# Patient Record
Sex: Female | Born: 1965 | Race: Black or African American | Hispanic: No | Smoking: Never smoker
Health system: Southern US, Community
[De-identification: ages and names within clinical notes are randomized; demographics above are authoritative.]

## PROBLEM LIST (undated history)

## (undated) DIAGNOSIS — R519 Headache, unspecified: Secondary | ICD-10-CM

## (undated) DIAGNOSIS — D649 Anemia, unspecified: Secondary | ICD-10-CM

## (undated) DIAGNOSIS — M1990 Unspecified osteoarthritis, unspecified site: Secondary | ICD-10-CM

## (undated) DIAGNOSIS — Z21 Asymptomatic human immunodeficiency virus [HIV] infection status: Secondary | ICD-10-CM

## (undated) DIAGNOSIS — R51 Headache: Secondary | ICD-10-CM

## (undated) DIAGNOSIS — K219 Gastro-esophageal reflux disease without esophagitis: Secondary | ICD-10-CM

## (undated) DIAGNOSIS — E119 Type 2 diabetes mellitus without complications: Secondary | ICD-10-CM

## (undated) DIAGNOSIS — J449 Chronic obstructive pulmonary disease, unspecified: Secondary | ICD-10-CM

## (undated) DIAGNOSIS — B20 Human immunodeficiency virus [HIV] disease: Secondary | ICD-10-CM

## (undated) DIAGNOSIS — I10 Essential (primary) hypertension: Secondary | ICD-10-CM

## (undated) DIAGNOSIS — R011 Cardiac murmur, unspecified: Secondary | ICD-10-CM

## (undated) HISTORY — PX: KNEE ARTHROSCOPY: SUR90

## (undated) HISTORY — DX: Anemia, unspecified: D64.9

---

## 2014-10-04 HISTORY — PX: CHOLECYSTECTOMY: SHX55

## 2016-09-18 ENCOUNTER — Ambulatory Visit
Admission: EM | Admit: 2016-09-18 | Discharge: 2016-09-18 | Disposition: A | Discharge status: Home / Self Care | Payer: Medicaid Other

## 2016-09-18 DIAGNOSIS — M25562 Pain in left knee: Secondary | ICD-10-CM | POA: Diagnosis not present

## 2016-09-18 DIAGNOSIS — M545 Low back pain, unspecified: Secondary | ICD-10-CM

## 2016-09-18 DIAGNOSIS — G8929 Other chronic pain: Secondary | ICD-10-CM

## 2016-09-18 HISTORY — DX: Gastro-esophageal reflux disease without esophagitis: K21.9

## 2016-09-18 HISTORY — DX: Essential (primary) hypertension: I10

## 2016-09-18 HISTORY — DX: Type 2 diabetes mellitus without complications: E11.9

## 2016-09-18 NOTE — ED Triage Notes (Signed)
Pt c/o right knee pain, she has a history of a break in that knee about a year ago. She also c/o back pain, she mentions she pulled it when she was reaching for something and its been hurting for about 1 week.

## 2016-09-18 NOTE — ED Provider Notes (Signed)
CSN: [id number]     Arrival date & time 09/18/16  1332 History   None   Chief Complaint  Patient presents with  . Knee Pain    right  . Back Pain    lower   (Consider location/radiation/quality/duration/timing/severity/associated sxs/prior Treatment) 50 yo F reporting low back muscle discomfort and return of some aching in her right knee that was hurt about a year ago. Initially denies any trauma but further review reveals she has had full care of her 61 month old baby Granddaughter for the past week. Baby has gotten quite heavy for Ms Tincher and with the addition of the carseat carrier and transitioning in and out of the car she has had quite a work out. She feels well and would not give up baby care "for anything"  But had not thought of it as related to her muscle aches. She is left handed, maneuvers the baby and carrier with increased assistance from the right leg-which now is also mildly uncomfortable. No numbness or tingling. No dropping things.. Right knee had trauma about a year ago but generally does well. No foot drop, no stumbling. Recreation of the different positions exactly re-creates the muscle discomfort she has expereinced        Past Medical History:  Diagnosis Date  . Diabetes mellitus without complication ([place])   . GERD (gastroesophageal reflux disease)   . Hypertension    History reviewed. No pertinent surgical history. History reviewed. No pertinent family history. Social History  Substance Use Topics  . Smoking status: Never Smoker  . Smokeless tobacco: Never Used  . Alcohol use No   OB History    No data available     Review of Systems  Constitutional: Negative.   HENT: Negative.   Eyes: Negative.   Respiratory: Negative.   Gastrointestinal: Negative.   Endocrine: Negative.   Genitourinary: Negative.   Musculoskeletal: Positive for arthralgias and back pain.       Muscles of the low back and right leg exhibiting aching c/w overuse syndrome-  nochange in function or strength  Skin: Negative.   Allergic/Immunologic: Negative.   Neurological: Negative.   Hematological: Negative.   Psychiatric/Behavioral: Negative.     Allergies  Motrin [ibuprofen] and Tramadol  Home Medications   Prior to Admission medications   Medication Sig Start Date End Date Taking? Authorizing Provider  gabapentin (NEURONTIN) 300 MG capsule Take 300 mg by mouth 3 (three) times daily.   Yes Historical Provider, MD  metoprolol tartrate (LOPRESSOR) 25 MG tablet Take 25 mg by mouth 2 (two) times daily.   Yes Historical Provider, MD  ranitidine (ZANTAC) 150 MG tablet Take 150 mg by mouth 2 (two) times daily.   Yes Historical Provider, MD   Meds Ordered and Administered this Visit  Medications - No data to display  BP (!) 136/92 (BP Location: Left Arm)   Pulse 88   Temp 98.4 F (36.9 C) (Oral)   Resp 18   Ht 5\' 4"  (1.626 m)   Wt 176 lb (79.8 kg)   LMP 07/04/2016   SpO2 100%   BMI 30.21 kg/m  No data found.   Physical Exam  Constitutional: She is oriented to person, place, and time. She appears well-developed and well-nourished.  Eyes: Conjunctivae and EOM are normal. Pupils are equal, round, and reactive to light.  glasses  Neck: Normal range of motion.  Cardiovascular: Normal rate and regular rhythm.   Pulmonary/Chest: Effort normal and breath sounds normal.  Abdominal: Soft.  Bowel sounds are normal.  Musculoskeletal: Normal range of motion. She exhibits tenderness.  Can climb on and off table without assistance ; midline low back muscle tenderness no paresthesia normal DTRs  Neurological: She is alert and oriented to person, place, and time. She displays normal reflexes. No cranial nerve deficit or sensory deficit.  Skin: Skin is warm and dry.  Psychiatric: She has a normal mood and affect.    Urgent Care Course   Clinical Course     Procedures (including critical care time)  Labs Review Labs Reviewed - No data to  display  Imaging Review No results found.  Patient is overweight  5'4" and 176 (stated) and admits to eating indiscretions with company and the holidays. Discussed elevated BP and need for dietary management and careful meds dosing ( no missed pills- no high salt diet). Have reviewed body mechanics and practiced appropriate methods for handling an increasingly heavy baby and her gear. General soreness can be treated with warm showers and rubs- althetic creams,massage- spreading out responsibility for carrying the baby with other appropriate adults. She has Gabapentin Rx and needs to remember her Metoprolol and dietary management daily. She has allergy to Motrin and will Rx with Tylenol and heat pad on return home prn   MDM   1. Acute midline low back pain without sciatica   2. Chronic pain of left knee     Plan: as noted above. RTC with exacerbation , questions or concerns. Questions fielded, recommendations reviewed, patient expressess understanding and agrees to plan of care    Jan Fireman, PA-C 09/19/16 2114

## 2016-09-18 NOTE — Discharge Instructions (Signed)
Extra Strength Tylenol 2 tablets by mouth up to 3 times a day as needed for discomfort-- athletic rubs to area- body mechanics as we discussed and practiced. Heat pad to low back as desired/ Ice packs with frozen peas can be comfortable as well. Careful this week with baby visiting again-- you aren't used to picking up the weight - stay centered.

## 2016-10-13 ENCOUNTER — Ambulatory Visit
Admission: EM | Admit: 2016-10-13 | Discharge: 2016-10-13 | Disposition: A | Discharge status: Home / Self Care | Payer: Medicaid Other | Attending: Family Medicine | Admitting: Family Medicine

## 2016-10-13 DIAGNOSIS — J40 Bronchitis, not specified as acute or chronic: Secondary | ICD-10-CM

## 2016-10-13 DIAGNOSIS — K219 Gastro-esophageal reflux disease without esophagitis: Secondary | ICD-10-CM | POA: Diagnosis not present

## 2016-10-13 DIAGNOSIS — R509 Fever, unspecified: Secondary | ICD-10-CM | POA: Diagnosis present

## 2016-10-13 DIAGNOSIS — I10 Essential (primary) hypertension: Secondary | ICD-10-CM | POA: Insufficient documentation

## 2016-10-13 DIAGNOSIS — E119 Type 2 diabetes mellitus without complications: Secondary | ICD-10-CM | POA: Insufficient documentation

## 2016-10-13 DIAGNOSIS — Z79899 Other long term (current) drug therapy: Secondary | ICD-10-CM | POA: Diagnosis not present

## 2016-10-13 LAB — RAPID INFLUENZA A&B ANTIGENS (ARMC ONLY): INFLUENZA A (ARMC): NEGATIVE

## 2016-10-13 LAB — RAPID INFLUENZA A&B ANTIGENS: Influenza B (ARMC): NEGATIVE

## 2016-10-13 MED ORDER — AZITHROMYCIN 250 MG PO TABS: 250.0000 mg | ORAL_TABLET | Freq: Every day | ORAL | 0 refills | Status: DC

## 2016-10-13 MED ORDER — DM-GUAIFENESIN ER 30-600 MG PO TB12: 1.0000 | ORAL_TABLET | Freq: Two times a day (BID) | ORAL | 0 refills | Status: DC

## 2016-10-13 NOTE — ED Provider Notes (Signed)
CSN: [id number]     Arrival date & time 10/13/16  [id number] History   None    Chief Complaint  Patient presents with  . Fever   (Consider location/radiation/quality/duration/timing/severity/associated sxs/prior Treatment) HPI: 51 yo female presented with CC of productive cough, congestion, fever, fatigue and myalgia x 4-5 days. Denies SOB, CP palpitations. Reports feeling nauseated and 2-3 loose stools/day. Denies blood or mucus in stool.  Past Medical History:  Diagnosis Date  . Diabetes mellitus without complication ([place])   . GERD (gastroesophageal reflux disease)   . Hypertension    History reviewed. No pertinent surgical history. History reviewed. No pertinent family history. Social History  Substance Use Topics  . Smoking status: Never Smoker  . Smokeless tobacco: Never Used  . Alcohol use No   OB History    No data available     Review of Systems  Constitutional: Positive for fatigue and fever.  HENT: Positive for congestion, postnasal drip and sinus pressure. Negative for sinus pain.   Respiratory: Positive for cough. Negative for shortness of breath and wheezing.     Allergies  Motrin [ibuprofen] and Tramadol  Home Medications   Prior to Admission medications   Medication Sig Start Date End Date Taking? Authorizing Provider  gabapentin (NEURONTIN) 300 MG capsule Take 300 mg by mouth 3 (three) times daily.   Yes Historical Provider, MD  metoprolol tartrate (LOPRESSOR) 25 MG tablet Take 25 mg by mouth 2 (two) times daily.   Yes Historical Provider, MD  ranitidine (ZANTAC) 150 MG tablet Take 150 mg by mouth 2 (two) times daily.   Yes Historical Provider, MD  azithromycin (ZITHROMAX) 250 MG tablet Take 1 tablet (250 mg total) by mouth daily. Take first 2 tablets together, then 1 every day until finished. 10/13/16   Zaira Iacovelli, NP  dextromethorphan-guaiFENesin (MUCINEX DM) 30-600 MG 12hr tablet Take 1 tablet by mouth 2 (two) times daily. 10/13/16   Sukaina Toothaker,  NP   Meds Ordered and Administered this Visit  Medications - No data to display  BP 99/74 (BP Location: Left Arm)   Pulse (!) 114   Temp 99.5 F (37.5 C) (Oral)   Resp 18   Ht 5\' 4"  (1.626 m)   Wt 179 lb (81.2 kg)   LMP 07/04/2016   SpO2 100%   BMI 30.73 kg/m  No data found.   Physical Exam  Constitutional: She appears well-nourished.  Pulmonary/Chest: Effort normal and breath sounds normal. No respiratory distress. She has no wheezes.  Abdominal: Soft. Bowel sounds are normal. She exhibits no distension. There is tenderness. There is no rebound and no guarding.  Skin: Skin is warm.  Reports green thick expectorant with coughing. B/L nasal mucosal inflammation with post nasal drip.  Urgent Care Course   Clinical Course     Procedures (including critical care time)  Labs Review Labs Reviewed  RAPID INFLUENZA A&B ANTIGENS ([place])    Imaging Review No results found.   Visual Acuity Review  Right Eye Distance:   Left Eye Distance:   Bilateral Distance:    Right Eye Near:   Left Eye Near:    Bilateral Near:         MDM   1. Bronchitis   Flu swan negative in house. Clinical suspicion for Bacterial Bronchitis based on clinical presentation and physical Exam with superimposed Viral Illness.Tyelnol/Motrin for fever/pain. Increase hydration. Flonase OTC as directed     Wyvonne Carda Jeanett Schlein, NP 10/13/16 1212    Gisela Lea, NP 10/13/16  1216  

## 2016-10-13 NOTE — ED Triage Notes (Signed)
Patient complains of cough, congestion, fevers, body aches, and vomiting. Patient states that symptoms started 4-5 days ago and have been worsening.

## 2017-01-24 ENCOUNTER — Ambulatory Visit
Admission: EM | Admit: 2017-01-24 | Discharge: 2017-01-24 | Disposition: A | Discharge status: Home / Self Care | Payer: Medicaid Other | Attending: Family Medicine | Admitting: Family Medicine

## 2017-01-24 ENCOUNTER — Encounter: Payer: Self-pay | Admitting: *Deleted

## 2017-01-24 DIAGNOSIS — M5440 Lumbago with sciatica, unspecified side: Secondary | ICD-10-CM | POA: Diagnosis not present

## 2017-01-24 DIAGNOSIS — M62830 Muscle spasm of back: Secondary | ICD-10-CM | POA: Diagnosis not present

## 2017-01-24 MED ORDER — ORPHENADRINE CITRATE ER 100 MG PO TB12: 100.0000 mg | ORAL_TABLET | Freq: Two times a day (BID) | ORAL | 0 refills | Status: DC

## 2017-01-24 MED ORDER — KETOROLAC TROMETHAMINE 60 MG/2ML IM SOLN
60.0000 mg | Freq: Once | INTRAMUSCULAR | Status: AC
  Administered 2017-01-24: 60 mg via INTRAMUSCULAR

## 2017-01-24 MED ORDER — MELOXICAM 15 MG PO TABS: 15.0000 mg | ORAL_TABLET | Freq: Every day | ORAL | 0 refills | Status: DC

## 2017-01-24 MED ORDER — OXYCODONE-ACETAMINOPHEN 5-325 MG PO TABS: 1.0000 | ORAL_TABLET | ORAL | 0 refills | Status: DC | PRN

## 2017-01-24 NOTE — ED Notes (Signed)
Patient shows no signs of adverse reaction to medication at this time.  
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
## 2017-01-24 NOTE — ED Triage Notes (Signed)
Onset of low back pain Saturday while attempting to move a piece of furniture. Pain is persistent and radiates to both legs with a "tingling" sensation to both legs.

## 2017-05-03 ENCOUNTER — Other Ambulatory Visit: Payer: Self-pay | Admitting: Internal Medicine

## 2017-05-03 DIAGNOSIS — R0602 Shortness of breath: Secondary | ICD-10-CM

## 2017-05-12 ENCOUNTER — Other Ambulatory Visit
Admission: RE | Admit: 2017-05-12 | Discharge: 2017-05-12 | Disposition: A | Discharge status: Home / Self Care | Payer: Medicaid Other | Source: Ambulatory Visit | Attending: Internal Medicine | Admitting: Internal Medicine

## 2017-05-12 ENCOUNTER — Encounter
Admission: RE | Admit: 2017-05-12 | Discharge: 2017-05-12 | Disposition: A | Discharge status: Home / Self Care | Payer: Medicaid Other | Source: Ambulatory Visit | Attending: Internal Medicine | Admitting: Internal Medicine

## 2017-05-12 DIAGNOSIS — R0602 Shortness of breath: Secondary | ICD-10-CM | POA: Insufficient documentation

## 2017-05-12 LAB — NM MYOCAR MULTI W/SPECT W/WALL MOTION / EF
CHL CUP MPHR: 171 {beats}/min
CHL CUP NUCLEAR SDS: 4
CHL CUP NUCLEAR SSS: 8
CSEPEDS: 0 s
CSEPEW: 1 METS
CSEPPHR: 104 {beats}/min
Exercise duration (min): 1 min
LV dias vol: 169 mL (ref 46–106)
LVSYSVOL: 106 mL
Percent HR: 60 %
Rest HR: 83 {beats}/min
SRS: 9
TID: 0.98

## 2017-05-12 LAB — HCG, QUANTITATIVE, PREGNANCY: HCG, BETA CHAIN, QUANT, S: 1 m[IU]/mL (ref <5)

## 2017-05-12 MED ORDER — TECHNETIUM TC 99M TETROFOSMIN IV KIT
12.7300 | PACK | Freq: Once | INTRAVENOUS | Status: AC | PRN
  Administered 2017-05-12: 12.73 via INTRAVENOUS

## 2017-05-12 MED ORDER — TECHNETIUM TC 99M TETROFOSMIN IV KIT
29.7200 | PACK | Freq: Once | INTRAVENOUS | Status: AC | PRN
  Administered 2017-05-12: 29.72 via INTRAVENOUS

## 2017-05-12 MED ORDER — REGADENOSON 0.4 MG/5ML IV SOLN
0.4000 mg | Freq: Once | INTRAVENOUS | Status: AC
  Administered 2017-05-12: 0.4 mg via INTRAVENOUS

## 2017-11-03 ENCOUNTER — Ambulatory Visit: Payer: Medicaid Other | Admitting: Student in an Organized Health Care Education/Training Program

## 2017-11-09 ENCOUNTER — Encounter: Payer: Self-pay | Admitting: Student in an Organized Health Care Education/Training Program

## 2017-11-09 ENCOUNTER — Ambulatory Visit
Payer: Medicaid Other | Attending: Student in an Organized Health Care Education/Training Program | Admitting: Student in an Organized Health Care Education/Training Program

## 2017-11-09 VITALS — BP 147/95 | HR 88 | Temp 98.2°F | Resp 16 | Ht 64.0 in | Wt 189.0 lb

## 2017-11-09 DIAGNOSIS — R200 Anesthesia of skin: Secondary | ICD-10-CM | POA: Insufficient documentation

## 2017-11-09 DIAGNOSIS — E1122 Type 2 diabetes mellitus with diabetic chronic kidney disease: Secondary | ICD-10-CM | POA: Diagnosis not present

## 2017-11-09 DIAGNOSIS — Z9049 Acquired absence of other specified parts of digestive tract: Secondary | ICD-10-CM | POA: Insufficient documentation

## 2017-11-09 DIAGNOSIS — M25562 Pain in left knee: Secondary | ICD-10-CM

## 2017-11-09 DIAGNOSIS — Z888 Allergy status to other drugs, medicaments and biological substances status: Secondary | ICD-10-CM | POA: Diagnosis not present

## 2017-11-09 DIAGNOSIS — G8929 Other chronic pain: Secondary | ICD-10-CM

## 2017-11-09 DIAGNOSIS — Z9889 Other specified postprocedural states: Secondary | ICD-10-CM | POA: Diagnosis not present

## 2017-11-09 DIAGNOSIS — M545 Low back pain, unspecified: Secondary | ICD-10-CM

## 2017-11-09 DIAGNOSIS — Z823 Family history of stroke: Secondary | ICD-10-CM | POA: Diagnosis not present

## 2017-11-09 DIAGNOSIS — I129 Hypertensive chronic kidney disease with stage 1 through stage 4 chronic kidney disease, or unspecified chronic kidney disease: Secondary | ICD-10-CM | POA: Insufficient documentation

## 2017-11-09 DIAGNOSIS — Z833 Family history of diabetes mellitus: Secondary | ICD-10-CM | POA: Diagnosis not present

## 2017-11-09 DIAGNOSIS — Z8489 Family history of other specified conditions: Secondary | ICD-10-CM | POA: Diagnosis not present

## 2017-11-09 DIAGNOSIS — Z8249 Family history of ischemic heart disease and other diseases of the circulatory system: Secondary | ICD-10-CM | POA: Diagnosis not present

## 2017-11-09 DIAGNOSIS — J439 Emphysema, unspecified: Secondary | ICD-10-CM | POA: Insufficient documentation

## 2017-11-09 DIAGNOSIS — J45909 Unspecified asthma, uncomplicated: Secondary | ICD-10-CM | POA: Insufficient documentation

## 2017-11-09 DIAGNOSIS — K219 Gastro-esophageal reflux disease without esophagitis: Secondary | ICD-10-CM | POA: Diagnosis not present

## 2017-11-09 DIAGNOSIS — G894 Chronic pain syndrome: Secondary | ICD-10-CM | POA: Diagnosis present

## 2017-11-09 DIAGNOSIS — Z886 Allergy status to analgesic agent status: Secondary | ICD-10-CM | POA: Diagnosis not present

## 2017-11-09 DIAGNOSIS — Z79899 Other long term (current) drug therapy: Secondary | ICD-10-CM | POA: Diagnosis not present

## 2017-11-09 DIAGNOSIS — N189 Chronic kidney disease, unspecified: Secondary | ICD-10-CM | POA: Diagnosis not present

## 2017-11-09 MED ORDER — TIZANIDINE HCL 4 MG PO TABS: 4.0000 mg | ORAL_TABLET | Freq: Three times a day (TID) | ORAL | 1 refills | Status: DC | PRN

## 2017-11-09 MED ORDER — GABAPENTIN 300 MG PO CAPS: ORAL_CAPSULE | ORAL | 1 refills | Status: DC
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
## 2017-11-09 NOTE — Patient Instructions (Addendum)
1.  Please sign patient release of medical records.  I will need to see her clinic records including imaging studies and prior procedures performed at your [facility] pain clinic.  2.  Increase her gabapentin so that you are taking 300 mg, 300 mg, 600 mg nightly  3.  Stop Flexeril start tizanidine 4 mg 3 times daily as needed for muscle spasms.  4.  Follow-up in 4 weeks only after we have received outside medical records and they have been scanned into the chart or given to me.

## 2017-11-09 NOTE — Progress Notes (Signed)
Safety precautions to be maintained throughout the outpatient stay will include: orient to surroundings, keep bed in low position, maintain call bell within reach at all times, provide assistance with transfer out of bed and ambulation.  

## 2017-11-09 NOTE — Progress Notes (Signed)
Nursing Pain Medication Assessment:  On Ms. Eggenberger regular appointment, she did not comply with our medication refill policy of bringing her pills and bottle(s) to be examined and counted. As a consequence, her prescriptions were written and held, until she could bring back the medications to be examined. She comes in now to comply with this requirement.  Medication: none currently  Pill/Patch Count: No pills available to be counted. Bottle Appearance: n/a Last Medication intake:  many months ago   Date: 11/09/17; Time: 10:58 AM

## 2017-11-15 LAB — COMPLIANCE DRUG ANALYSIS, UR

## 2017-11-24 ENCOUNTER — Encounter: Payer: Self-pay | Admitting: *Deleted

## 2017-12-08 ENCOUNTER — Encounter: Payer: Self-pay | Admitting: Student in an Organized Health Care Education/Training Program

## 2017-12-08 ENCOUNTER — Other Ambulatory Visit: Payer: Self-pay

## 2017-12-08 ENCOUNTER — Ambulatory Visit
Payer: Medicaid Other | Attending: Student in an Organized Health Care Education/Training Program | Admitting: Student in an Organized Health Care Education/Training Program

## 2017-12-08 VITALS — BP 165/110 | HR 84 | Temp 98.7°F | Resp 18 | Ht 64.0 in | Wt 189.0 lb

## 2017-12-08 DIAGNOSIS — M545 Low back pain, unspecified: Secondary | ICD-10-CM

## 2017-12-08 DIAGNOSIS — Z888 Allergy status to other drugs, medicaments and biological substances status: Secondary | ICD-10-CM | POA: Insufficient documentation

## 2017-12-08 DIAGNOSIS — Z9889 Other specified postprocedural states: Secondary | ICD-10-CM | POA: Diagnosis not present

## 2017-12-08 DIAGNOSIS — Z8249 Family history of ischemic heart disease and other diseases of the circulatory system: Secondary | ICD-10-CM | POA: Insufficient documentation

## 2017-12-08 DIAGNOSIS — Z79899 Other long term (current) drug therapy: Secondary | ICD-10-CM | POA: Insufficient documentation

## 2017-12-08 DIAGNOSIS — Z6832 Body mass index (BMI) 32.0-32.9, adult: Secondary | ICD-10-CM | POA: Diagnosis not present

## 2017-12-08 DIAGNOSIS — G8929 Other chronic pain: Secondary | ICD-10-CM | POA: Diagnosis not present

## 2017-12-08 DIAGNOSIS — G894 Chronic pain syndrome: Secondary | ICD-10-CM | POA: Diagnosis not present

## 2017-12-08 DIAGNOSIS — Z823 Family history of stroke: Secondary | ICD-10-CM | POA: Diagnosis not present

## 2017-12-08 DIAGNOSIS — I10 Essential (primary) hypertension: Secondary | ICD-10-CM | POA: Diagnosis not present

## 2017-12-08 DIAGNOSIS — Z833 Family history of diabetes mellitus: Secondary | ICD-10-CM | POA: Insufficient documentation

## 2017-12-08 DIAGNOSIS — K219 Gastro-esophageal reflux disease without esophagitis: Secondary | ICD-10-CM | POA: Insufficient documentation

## 2017-12-08 DIAGNOSIS — E119 Type 2 diabetes mellitus without complications: Secondary | ICD-10-CM | POA: Insufficient documentation

## 2017-12-08 DIAGNOSIS — Z886 Allergy status to analgesic agent status: Secondary | ICD-10-CM | POA: Diagnosis not present

## 2017-12-08 DIAGNOSIS — M25562 Pain in left knee: Secondary | ICD-10-CM | POA: Diagnosis not present

## 2017-12-08 NOTE — Progress Notes (Signed)
Safety precautions to be maintained throughout the outpatient stay will include: orient to surroundings, keep bed in low position, maintain call bell within reach at all times, provide assistance with transfer out of bed and ambulation.  

## 2017-12-08 NOTE — Progress Notes (Signed)
Patient's Name: Alicia Lynch  MRN: [id number]  Referring Provider: Lorelee Market, MD  DOB: Dec 24, 1965  PCP: Lorelee Market, MD  DOS: 12/08/2017  Note by: Gillis Santa, MD  Service setting: Ambulatory outpatient  Specialty: Interventional Pain Management  Location: [facility] (AMB) Pain Management Facility    Patient type: Established   Primary Reason(s) for Visit: Encounter for evaluation before starting new chronic pain management plan of care (Level of risk: moderate) CC: Back Pain (low)  HPI  Alicia Lynch is a 52 y.o. year old, female patient, who comes today for a follow-up evaluation to review the test results and decide on a treatment plan. She has Chronic bilateral low back pain without sciatica; Chronic pain of left knee; H/O left knee surgery; Morbid obesity ([place]); and Chronic pain syndrome on their problem list. Her primarily concern today is the Back Pain (low)  Pain Assessment: Location: Lower Back Radiating: denies Onset: More than a month ago Duration: Chronic pain Quality: Aching, Numbness, Pressure Severity: 8 /10 (self-reported pain score)  Note: Reported level is inconsistent with clinical observations. Clinically the patient looks like a 2/10 A 2/10 is viewed as "Mild to Moderate" and described as noticeable and distracting. Impossible to hide from other people. More frequent flare-ups. Still possible to adapt and function close to normal. It can be very annoying and may have occasional stronger flare-ups. With discipline, patients may get used to it and adapt.       When using our objective Pain Scale, levels between 6 and 10/10 are said to belong in an emergency room, as it progressively worsens from a 6/10, described as severely limiting, requiring emergency care not usually available at an outpatient pain management facility. At a 6/10 level, communication becomes difficult and requires great effort. Assistance to reach the emergency department may be required. Facial  flushing and profuse sweating along with potentially dangerous increases in heart rate and blood pressure will be evident. Effect on ADL:   Timing: Constant Modifying factors: lying down  Alicia Lynch comes in today for a follow-up visit after her initial evaluation on 11/09/2017. Today we went over the results of her tests. These were explained in "Layman's terms". During today's appointment we went over my diagnostic impression, as well as the proposed treatment plan.  Patient comes in for her second patient visit.  We do not have any records from her outside pain clinic in [facility].  This will be a very limited evaluation since I told the patient last time that she must confirm that we have imaging records as well as outside interventional records before I see her again.  Patient does have an upper respiratory illness.  No other changes in her chronic pain history.  UDS from previous visit was appropriate.   In considering the treatment plan options, [name] was reminded that I no longer take patients for medication management only. I asked her to let me know if she had no intention of taking advantage of the interventional therapies, so that we could make arrangements to provide this space to someone interested. I also made it clear that undergoing interventional therapies for the purpose of getting pain medications is very inappropriate on the part of a patient, and it will not be tolerated in this practice. This type of behavior would suggest true addiction and therefore it requires referral to an addiction specialist.   Further details on both, my assessment(s), as well as the proposed treatment plan, please see below.  Controlled Substance  Pharmacotherapy Assessment REMS (Risk Evaluation and Mitigation Strategy)  Analgesic: N/A MME/day: N/A mg/day. Pill Count: None expected due to no prior prescriptions written by our practice. Dewayne Shorter, RN  12/08/2017  8:23 AM   Signed Safety precautions to be maintained throughout the outpatient stay will include: orient to surroundings, keep bed in low position, maintain call bell within reach at all times, provide assistance with transfer out of bed and ambulation.     Monitoring: [place] PMP: Online review of the past 16-month period previously conducted. Not applicable at this point since we have not taken over the patient's medication management yet. List of other Serum/Urine Drug Screening Test(s):  No results found for: AMPHSCRSER, BARBSCRSER, BENZOSCRSER, COCAINSCRSER, COCAINSCRNUR, PCPSCRSER, THCSCRSER, THCU, CANNABQUANT, [place], [place], [place], [place] List of all UDS test(s) done:  Lab Results  Component Value Date   SUMMARY FINAL 11/09/2017   Last UDS on record: Summary  Date Value Ref Range Status  11/09/2017 FINAL  Final    Comment:    ==================================================================== TOXASSURE COMP DRUG ANALYSIS,UR ==================================================================== Test                             Result       Flag       Units Drug Present and Declared for Prescription Verification   Gabapentin                     PRESENT      EXPECTED   Metoprolol                     PRESENT      EXPECTED Drug Present not Declared for Prescription Verification   Cyclobenzaprine                PRESENT      UNEXPECTED   Desmethylcyclobenzaprine       PRESENT      UNEXPECTED    Desmethylcyclobenzaprine is an expected metabolite of    cyclobenzaprine.   Acetaminophen                  PRESENT      UNEXPECTED Drug Absent but Declared for Prescription Verification   Tizanidine                     Not Detected UNEXPECTED    Tizanidine, as indicated in the declared medication list, is not    always detected even when used as directed.   Orphenadrine                   Not Detected UNEXPECTED   Trazodone                      Not Detected UNEXPECTED   Hydroxyzine                     Not Detected UNEXPECTED   Guaifenesin                    Not Detected UNEXPECTED ==================================================================== Test                      Result    Flag   Units      Ref Range   Creatinine              136  mg/dL      >=20 ==================================================================== Declared Medications:  The flagging and interpretation on this report are based on the  following declared medications.  Unexpected results may arise from  inaccuracies in the declared medications.  **Note: The testing scope of this panel includes these medications:  Gabapentin (Neurontin)  Guaifenesin (Mucinex)  Hydroxyzine (Atarax)  Metoprolol (Lopressor)  Orphenadrine (Norflex)  Trazodone (Desyrel)  **Note: The testing scope of this panel does not include small to  moderate amounts of these reported medications:  Tizanidine (Zanaflex)  **Note: The testing scope of this panel does not include following  reported medications:  Albuterol  Fluticasone (Advair)  Ipratropium (Atrovent)  Meloxicam (Mobic)  Ranitidine (Zantac)  Salmeterol (Advair)  Tiotropium (Spiriva) ==================================================================== For clinical consultation, please call [phone]. ====================================================================    UDS interpretation: No unexpected findings.          Medication Assessment Form: Not applicable. No opioids. Treatment compliance: Not applicable Risk Assessment Profile: Aberrant behavior: See initial evaluations. None observed or detected today Comorbid factors increasing risk of overdose: See initial evaluation. No additional risks detected today Medical Psychology Evaluation: Moderate Risk Opioid Risk Tool - 12/08/17 7425      Family History of Substance Abuse   Alcohol  Negative    Illegal Drugs  Negative    Rx Drugs  Negative      Personal History of Substance Abuse    Alcohol  Positive Female or Female    Illegal Drugs  Negative    Rx Drugs  Negative      Age   Age between 90-45 years   No      History of Preadolescent Sexual Abuse   History of Preadolescent Sexual Abuse  Negative or Female      Psychological Disease   Psychological Disease  Positive    Bipolar  Positive      Total Score   Opioid Risk Tool Scoring  5    Opioid Risk Interpretation  Moderate Risk      ORT Scoring interpretation table:  Score <3 = Low Risk for SUD  Score between 4-7 = Moderate Risk for SUD  Score >8 = High Risk for Opioid Abuse   Risk Mitigation Strategies:  Patient opioid safety counseling: Opioid therapy will not be included in the treatment plan. Patient-Prescriber Agreement (PPA): No agreement signed.  Controlled substance notification to other providers: None required. No opioid therapy.  Pharmacologic Plan: Patient is not an appropriate candidate for opioid therapy at this time.           We will focus on non-opioid-based pharmacologic therapy as well as interventional therapy pending records from previous pain MD.   Meds   Current Outpatient Medications:  .  albuterol (ACCUNEB) 0.63 MG/3ML nebulizer solution, Inhale 0.63 mg into the lungs as needed., Disp: , Rfl:  .  dextromethorphan-guaiFENesin (MUCINEX DM) 30-600 MG 12hr tablet, Take 1 tablet by mouth 2 (two) times daily., Disp: 20 tablet, Rfl: 0 .  fluticasone-salmeterol (ADVAIR HFA) 45-21 MCG/ACT inhaler, Inhale 2 puffs into the lungs daily., Disp: , Rfl:  .  gabapentin (NEURONTIN) 300 MG capsule, 300 mg/300 mg/ 600 mg qhs, Disp: 120 capsule, Rfl: 1 .  hydrOXYzine (ATARAX/VISTARIL) 25 MG tablet, Take 25 mg by mouth 3 (three) times daily as needed., Disp: , Rfl:  .  ipratropium (ATROVENT HFA) 17 MCG/ACT inhaler, Inhale 2 puffs into the lungs daily., Disp: , Rfl:  .  meloxicam (MOBIC) 15 MG tablet, Take 1 tablet (15 mg total)  by mouth daily., Disp: 30 tablet, Rfl: 0 .  metoprolol tartrate (LOPRESSOR)  25 MG tablet, Take 25 mg by mouth 2 (two) times daily., Disp: , Rfl:  .  orphenadrine (NORFLEX) 100 MG tablet, Take 1 tablet (100 mg total) by mouth 2 (two) times daily., Disp: 60 tablet, Rfl: 0 .  ranitidine (ZANTAC) 150 MG tablet, Take 150 mg by mouth 2 (two) times daily., Disp: , Rfl:  .  tiotropium (SPIRIVA HANDIHALER) 18 MCG inhalation capsule, Place 2 puffs into inhaler and inhale daily., Disp: , Rfl:  .  tiZANidine (ZANAFLEX) 4 MG tablet, Take 1 tablet (4 mg total) by mouth every 8 (eight) hours as needed for muscle spasms., Disp: 90 tablet, Rfl: 1 .  traZODone (DESYREL) 50 MG tablet, Take 50 mg by mouth at bedtime., Disp: , Rfl:   ROS  Constitutional: Denies any fever or chills Gastrointestinal: No reported hemesis, hematochezia, vomiting, or acute GI distress Musculoskeletal: Denies any acute onset joint swelling, redness, loss of ROM, or weakness Neurological: No reported episodes of acute onset apraxia, aphasia, dysarthria, agnosia, amnesia, paralysis, loss of coordination, or loss of consciousness  Allergies  Ms. Reif is allergic to motrin [ibuprofen] and tramadol.  [employer]  Drug: Ms. Beidler  reports that she does not use drugs. Alcohol:  reports that she does not drink alcohol. Tobacco:  reports that  has never smoked. she has never used smokeless tobacco. Medical:  has a past medical history of Diabetes mellitus without complication ([place]), GERD (gastroesophageal reflux disease), and Hypertension. Surgical: Ms. Mancebo  has a past surgical history that includes Cholecystectomy and Knee arthroscopy (Left). Family: family history includes Diabetes in her father and mother; Heart disease in her father and mother; Hyperlipidemia in her father and mother; Hypertension in her brother and brother; Stroke in her father; Thyroid disease in her mother; Vision loss in her mother.  Constitutional Exam  General appearance: Well nourished, well developed, and well hydrated. In no apparent  acute distress Vitals:   12/08/17 0818  BP: (!) 165/110  Pulse: 84  Resp: 18  Temp: 98.7 F (37.1 C)  SpO2: 100%  Weight: 189 lb (85.7 kg)  Height: 5\' 4"  (1.626 m)   BMI Assessment: Estimated body mass index is 32.44 kg/m as calculated from the following:   Height as of this encounter: 5\' 4"  (1.626 m).   Weight as of this encounter: 189 lb (85.7 kg).  BMI interpretation table: BMI level Category Range association with higher incidence of chronic pain  <18 kg/m2 Underweight   18.5-24.9 kg/m2 Ideal body weight   25-29.9 kg/m2 Overweight Increased incidence by 20%  30-34.9 kg/m2 Obese (Class I) Increased incidence by 68%  35-39.9 kg/m2 Severe obesity (Class II) Increased incidence by 136%  >40 kg/m2 Extreme obesity (Class III) Increased incidence by 254%   BMI Readings from Last 4 Encounters:  12/08/17 32.44 kg/m  11/09/17 32.44 kg/m  01/24/17 30.73 kg/m  10/13/16 30.73 kg/m   Wt Readings from Last 4 Encounters:  12/08/17 189 lb (85.7 kg)  11/09/17 189 lb (85.7 kg)  01/24/17 179 lb (81.2 kg)  10/13/16 179 lb (81.2 kg)  Psych/Mental status: Alert, oriented x 3 (person, place, & time)       Eyes: PERLA Respiratory: No evidence of acute respiratory distress  Cervical Spine Area Exam  Skin & Axial Inspection: No masses, redness, edema, swelling, or associated skin lesions Alignment: Symmetrical Functional ROM: Unrestricted ROM      Stability: No instability detected Muscle Tone/Strength: Functionally  intact. No obvious neuro-muscular anomalies detected. Sensory (Neurological): Unimpaired Palpation: No palpable anomalies              Upper Extremity (UE) Exam    Side: Right upper extremity  Side: Left upper extremity  Skin & Extremity Inspection: Skin color, temperature, and hair growth are WNL. No peripheral edema or cyanosis. No masses, redness, swelling, asymmetry, or associated skin lesions. No contractures.  Skin & Extremity Inspection: Skin color, temperature,  and hair growth are WNL. No peripheral edema or cyanosis. No masses, redness, swelling, asymmetry, or associated skin lesions. No contractures.  Functional ROM: Unrestricted ROM          Functional ROM: Unrestricted ROM          Muscle Tone/Strength: Functionally intact. No obvious neuro-muscular anomalies detected.  Muscle Tone/Strength: Functionally intact. No obvious neuro-muscular anomalies detected.  Sensory (Neurological): Unimpaired          Sensory (Neurological): Unimpaired          Palpation: No palpable anomalies              Palpation: No palpable anomalies              Specialized Test(s): Deferred         Specialized Test(s): Deferred          Thoracic Spine Area Exam  Skin & Axial Inspection: No masses, redness, or swelling Alignment: Symmetrical Functional ROM: Unrestricted ROM Stability: No instability detected Muscle Tone/Strength: Functionally intact. No obvious neuro-muscular anomalies detected. Sensory (Neurological): Unimpaired Muscle strength & Tone: No palpable anomalies   Lumbar Spine Area Exam  Skin & Axial Inspection: No masses, redness, or swelling Alignment: Symmetrical Functional ROM: Decreased ROM, bilaterally Stability: No instability detected Muscle Tone/Strength: Functionally intact. No obvious neuro-muscular anomalies detected. Sensory (Neurological): Articular pain pattern Palpation: Complains of area being tender to palpation Bilateral Fist Percussion Test Provocative Tests: Lumbar Hyperextension and rotation test: Positive bilaterally for facet joint pain. Lumbar Lateral bending test: Positive due to fusion restriction. Patrick's Maneuver: evaluation deferred today                    Gait & Posture Assessment  Ambulation: Patient ambulates using a cane Gait: Limited. Using assistive device to ambulate Posture: Poor   Lower Extremity Exam    Side: Right lower extremity  Side: Left lower extremity  Skin & Extremity Inspection: Skin color,  temperature, and hair growth are WNL. No peripheral edema or cyanosis. No masses, redness, swelling, asymmetry, or associated skin lesions. No contractures.  Skin & Extremity Inspection: Skin color, temperature, and hair growth are WNL. No peripheral edema or cyanosis. No masses, redness, swelling, asymmetry, or associated skin lesions. No contractures.  Functional ROM: Unrestricted ROM          Functional ROM: Decreased ROM for knee joint left knee brace on.  Muscle Tone/Strength: Movement possible against some resistance (4/5)  Muscle Tone/Strength: Movement possible against some resistance (4/5)  Sensory (Neurological): Unimpaired  Sensory (Neurological): Unimpaired  Palpation: No palpable anomalies  Palpation: No palpable anomalies  4 out of 5 strength left lower extremity: Plantar flexion, dorsiflexion, knee flexion, knee extension.    Assessment & Plan  Primary Diagnosis & Pertinent Problem List: The primary encounter diagnosis was Chronic bilateral low back pain without sciatica. Diagnoses of Chronic pain of left knee, H/O left knee surgery, Morbid obesity ([place]), and Chronic pain syndrome were also pertinent to this visit.  Visit Diagnosis: 1. Chronic bilateral low  back pain without sciatica   2. Chronic pain of left knee   3. H/O left knee surgery   4. Morbid obesity ([place])   5. Chronic pain syndrome    General Recommendations: The pain condition that the patient suffers from is best treated with a multidisciplinary approach that involves an increase in physical activity to prevent de-conditioning and worsening of the pain cycle, as well as psychological counseling (formal and/or informal) to address the co-morbid psychological affects of pain. Treatment will often involve judicious use of pain medications and interventional procedures to decrease the pain, allowing the patient to participate in the physical activity that will ultimately produce long-lasting pain reductions. The goal of  the multidisciplinary approach is to return the patient to a higher level of overall function and to restore their ability to perform activities of daily living.  52 year old female with a history of obesity, chronic pain syndrome who presents with a chief complaint of axial low back pain as well as left knee pain.  Patient was previously being managed at a pain clinic in [facility].  She was on opioid therapy that consisted of oxycodone 5 mg twice daily to 3 times daily as needed.  She has been off of opioid medications since September 2018.  Her axial low back pain seems secondary to lumbar degenerative disc disease, lumbar spondylosis, facet arthropathy.  Patient presents today for follow-up for second visit.  We do not have any outside medical records as requested at the last visit.  I instructed the patient that I am unable to form a therapeutic plan for her unless I see what has previously been done in terms of medications as well as interventional therapies.  I have also instructed the patient to send any imaging studies.  She states that she has these at home and will try to bring them next time.  I instructed the patient that she must confirm receipt of outside medical records or have them when she comes in next time.  We reviewed her gabapentin intake which she is taking 300, 300, 600 mg nightly.  She is also utilizing tizanidine 4 mg as needed for muscle spasms.  I have instructed the patient to continue with stretching exercises for her low back pain.  Patient will follow-up in 3-4 weeks after we have received her outside medical records.  We will focus on non-opioid-based pain management and the patient has been made aware of this.  Future considerations: Lumbar facet medial branch nerve blocks, L3, L4, L5; lumbar epidural steroid injection; SI joint blocks bilaterally.  Time Note: Greater than 50% of the 15 minute(s) of face-to-face time spent with Ms. Ambrosius, was spent  in counseling/coordination of care regarding: importance of providing outside medical records from previous pain clinic so that we can discuss treatment plan, realistic expectations and the goals of pain management (increased in functionality).   Considering:   Lumbar facet medial branch nerve blocks, L3, L4, L5; lumbar epidural steroid injection; SI joint blocks bilaterally.    Provider-requested follow-up: Return in about 3 weeks (around 12/29/2017) for Medication Management.  Future Appointments  Date Time Provider [place]  12/20/2017  9:00 AM Byrnett, Forest Gleason, MD [facility] None  12/28/2017  1:30 PM Gillis Santa, MD [facility] None    Primary Care Physician: Lorelee Market, MD Location: [facility] Note by: Gillis Santa, M.D Date: 12/08/2017; Time: 10:14 AM  Patient Instructions  Must have records

## 2017-12-08 NOTE — Patient Instructions (Signed)
Must have records

## 2017-12-20 ENCOUNTER — Encounter: Payer: Self-pay | Admitting: General Surgery

## 2017-12-20 ENCOUNTER — Ambulatory Visit: Payer: Medicaid Other | Admitting: General Surgery

## 2017-12-20 VITALS — BP 122/88 | HR 88 | Resp 12 | Ht 64.0 in | Wt 191.0 lb

## 2017-12-20 DIAGNOSIS — R590 Localized enlarged lymph nodes: Secondary | ICD-10-CM | POA: Diagnosis not present

## 2017-12-20 NOTE — Patient Instructions (Addendum)
The patient is aware to call back for any questions or new concerns. Recommend hematologist
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
## 2017-12-21 DIAGNOSIS — R590 Localized enlarged lymph nodes: Secondary | ICD-10-CM | POA: Insufficient documentation

## 2017-12-28 ENCOUNTER — Encounter: Payer: Medicaid Other | Admitting: Student in an Organized Health Care Education/Training Program

## 2018-01-05 ENCOUNTER — Ambulatory Visit: Payer: Medicaid Other | Admitting: Student in an Organized Health Care Education/Training Program

## 2018-01-09 ENCOUNTER — Encounter: Payer: Medicaid Other | Admitting: Student in an Organized Health Care Education/Training Program

## 2018-01-10 ENCOUNTER — Ambulatory Visit
Payer: Medicaid Other | Attending: Student in an Organized Health Care Education/Training Program | Admitting: Student in an Organized Health Care Education/Training Program

## 2018-01-12 ENCOUNTER — Ambulatory Visit
Admission: EM | Admit: 2018-01-12 | Discharge: 2018-01-12 | Disposition: A | Discharge status: Home / Self Care | Payer: Medicaid Other | Attending: Family Medicine | Admitting: Family Medicine

## 2018-01-12 ENCOUNTER — Other Ambulatory Visit: Payer: Self-pay

## 2018-01-12 ENCOUNTER — Encounter: Payer: Self-pay | Admitting: Emergency Medicine

## 2018-01-12 DIAGNOSIS — S39012A Strain of muscle, fascia and tendon of lower back, initial encounter: Secondary | ICD-10-CM | POA: Diagnosis not present

## 2018-01-12 MED ORDER — KETOROLAC TROMETHAMINE 60 MG/2ML IM SOLN
60.0000 mg | Freq: Once | INTRAMUSCULAR | Status: AC
Start: 2018-01-12 — End: 2018-01-12
  Administered 2018-01-12: 60 mg via INTRAMUSCULAR

## 2018-01-12 MED ORDER — NAPROXEN 500 MG PO TABS: 500.0000 mg | ORAL_TABLET | Freq: Two times a day (BID) | ORAL | 0 refills | Status: DC

## 2018-01-12 MED ORDER — TIZANIDINE HCL 4 MG PO CAPS: 4.0000 mg | ORAL_CAPSULE | Freq: Three times a day (TID) | ORAL | 0 refills | Status: DC

## 2018-01-12 NOTE — ED Triage Notes (Signed)
Patient c/o lower back pain after bending down to get her rabbit on Saturday.
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
## 2018-01-25 ENCOUNTER — Encounter: Payer: Self-pay | Admitting: Student in an Organized Health Care Education/Training Program

## 2018-02-28 ENCOUNTER — Ambulatory Visit
Payer: Medicaid Other | Attending: Student in an Organized Health Care Education/Training Program | Admitting: Student in an Organized Health Care Education/Training Program

## 2018-02-28 ENCOUNTER — Encounter: Payer: Self-pay | Admitting: Student in an Organized Health Care Education/Training Program

## 2018-02-28 ENCOUNTER — Other Ambulatory Visit: Payer: Self-pay

## 2018-02-28 VITALS — BP 123/97 | HR 89 | Temp 98.5°F | Ht 64.0 in | Wt 190.0 lb

## 2018-02-28 DIAGNOSIS — Z6832 Body mass index (BMI) 32.0-32.9, adult: Secondary | ICD-10-CM | POA: Diagnosis not present

## 2018-02-28 DIAGNOSIS — Z9889 Other specified postprocedural states: Secondary | ICD-10-CM | POA: Diagnosis not present

## 2018-02-28 DIAGNOSIS — Z888 Allergy status to other drugs, medicaments and biological substances status: Secondary | ICD-10-CM | POA: Insufficient documentation

## 2018-02-28 DIAGNOSIS — G894 Chronic pain syndrome: Secondary | ICD-10-CM | POA: Diagnosis not present

## 2018-02-28 DIAGNOSIS — M545 Low back pain: Secondary | ICD-10-CM | POA: Diagnosis not present

## 2018-02-28 DIAGNOSIS — Z9049 Acquired absence of other specified parts of digestive tract: Secondary | ICD-10-CM | POA: Insufficient documentation

## 2018-02-28 DIAGNOSIS — Z886 Allergy status to analgesic agent status: Secondary | ICD-10-CM | POA: Diagnosis not present

## 2018-02-28 DIAGNOSIS — I10 Essential (primary) hypertension: Secondary | ICD-10-CM | POA: Insufficient documentation

## 2018-02-28 DIAGNOSIS — K219 Gastro-esophageal reflux disease without esophagitis: Secondary | ICD-10-CM | POA: Insufficient documentation

## 2018-02-28 DIAGNOSIS — Z79899 Other long term (current) drug therapy: Secondary | ICD-10-CM | POA: Diagnosis not present

## 2018-02-28 DIAGNOSIS — G8929 Other chronic pain: Secondary | ICD-10-CM | POA: Diagnosis not present

## 2018-02-28 DIAGNOSIS — Z5181 Encounter for therapeutic drug level monitoring: Secondary | ICD-10-CM | POA: Diagnosis not present

## 2018-02-28 DIAGNOSIS — M25562 Pain in left knee: Secondary | ICD-10-CM

## 2018-02-28 DIAGNOSIS — E119 Type 2 diabetes mellitus without complications: Secondary | ICD-10-CM | POA: Insufficient documentation

## 2018-02-28 MED ORDER — DICLOFENAC SODIUM 75 MG PO TBEC: 75.0000 mg | DELAYED_RELEASE_TABLET | Freq: Two times a day (BID) | ORAL | 1 refills | Status: DC

## 2018-02-28 MED ORDER — TIZANIDINE HCL 4 MG PO CAPS: 4.0000 mg | ORAL_CAPSULE | Freq: Three times a day (TID) | ORAL | 2 refills | Status: DC | PRN
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
## 2018-02-28 NOTE — Patient Instructions (Signed)
Rx for Voltaren and Zanaflex has been escribed to your pharmacy.

## 2018-03-08 ENCOUNTER — Other Ambulatory Visit: Payer: Self-pay

## 2018-04-25 ENCOUNTER — Ambulatory Visit: Payer: Medicaid Other | Admitting: Nurse Practitioner

## 2018-04-26 ENCOUNTER — Ambulatory Visit: Payer: Medicaid Other | Attending: Nurse Practitioner | Admitting: Nurse Practitioner

## 2018-04-26 ENCOUNTER — Encounter: Payer: Self-pay | Admitting: Nurse Practitioner

## 2018-04-26 VITALS — BP 139/107 | HR 84 | Temp 98.0°F | Resp 16 | Ht 64.0 in | Wt 198.0 lb

## 2018-04-26 DIAGNOSIS — G8929 Other chronic pain: Secondary | ICD-10-CM

## 2018-04-26 DIAGNOSIS — R011 Cardiac murmur, unspecified: Secondary | ICD-10-CM | POA: Diagnosis not present

## 2018-04-26 DIAGNOSIS — M25561 Pain in right knee: Secondary | ICD-10-CM

## 2018-04-26 DIAGNOSIS — Z5181 Encounter for therapeutic drug level monitoring: Secondary | ICD-10-CM | POA: Diagnosis present

## 2018-04-26 DIAGNOSIS — Z9889 Other specified postprocedural states: Secondary | ICD-10-CM | POA: Insufficient documentation

## 2018-04-26 DIAGNOSIS — M25562 Pain in left knee: Secondary | ICD-10-CM | POA: Insufficient documentation

## 2018-04-26 DIAGNOSIS — Z888 Allergy status to other drugs, medicaments and biological substances status: Secondary | ICD-10-CM | POA: Diagnosis not present

## 2018-04-26 DIAGNOSIS — G894 Chronic pain syndrome: Secondary | ICD-10-CM | POA: Diagnosis not present

## 2018-04-26 DIAGNOSIS — G4730 Sleep apnea, unspecified: Secondary | ICD-10-CM | POA: Diagnosis not present

## 2018-04-26 DIAGNOSIS — E119 Type 2 diabetes mellitus without complications: Secondary | ICD-10-CM | POA: Insufficient documentation

## 2018-04-26 DIAGNOSIS — Z6833 Body mass index (BMI) 33.0-33.9, adult: Secondary | ICD-10-CM | POA: Insufficient documentation

## 2018-04-26 DIAGNOSIS — M79604 Pain in right leg: Secondary | ICD-10-CM | POA: Diagnosis not present

## 2018-04-26 DIAGNOSIS — M545 Low back pain: Secondary | ICD-10-CM | POA: Diagnosis not present

## 2018-04-26 DIAGNOSIS — Z886 Allergy status to analgesic agent status: Secondary | ICD-10-CM | POA: Insufficient documentation

## 2018-04-26 DIAGNOSIS — I10 Essential (primary) hypertension: Secondary | ICD-10-CM | POA: Diagnosis not present

## 2018-04-26 DIAGNOSIS — Z79899 Other long term (current) drug therapy: Secondary | ICD-10-CM | POA: Insufficient documentation

## 2018-04-26 DIAGNOSIS — Z9049 Acquired absence of other specified parts of digestive tract: Secondary | ICD-10-CM | POA: Insufficient documentation

## 2018-04-26 DIAGNOSIS — M79605 Pain in left leg: Secondary | ICD-10-CM

## 2018-04-26 MED ORDER — GABAPENTIN 300 MG PO CAPS: ORAL_CAPSULE | ORAL | 2 refills | Status: DC

## 2018-04-26 NOTE — Patient Instructions (Addendum)
____________________________________________________________________________________________  Pain Scale  Introduction: The pain score used by this practice is the Verbal Numerical Rating Scale (VNRS-11). This is an 11-point scale. It is for adults and children 10 years or older. There are significant differences in how the pain score is reported, used, and applied. Forget everything you learned in the past and learn this scoring system.  General Information: The scale should reflect your current level of pain. Unless you are specifically asked for the level of your worst pain, or your average pain. If you are asked for one of these two, then it should be understood that it is over the past 24 hours.  Basic Activities of Daily Living (ADL): Personal hygiene, dressing, eating, transferring, and using restroom.  Instructions: Most patients tend to report their level of pain as a combination of two factors, their physical pain and their psychosocial pain. This last one is also known as "suffering" and it is reflection of how physical pain affects you socially and psychologically. From now on, report them separately. From this point on, when asked to report your pain level, report only your physical pain. Use the following table for reference.  Pain Clinic Pain Levels (0-5/10)  Pain Level Score  Description  No Pain 0   Mild pain 1 Nagging, annoying, but does not interfere with basic activities of daily living (ADL). Patients are able to eat, bathe, get dressed, toileting (being able to get on and off the toilet and perform personal hygiene functions), transfer (move in and out of bed or a chair without assistance), and maintain continence (able to control bladder and bowel functions). Blood pressure and heart rate are unaffected. A normal heart rate for a healthy adult ranges from 60 to 100 bpm (beats per minute).   Mild to moderate pain 2 Noticeable and distracting. Impossible to hide from other  people. More frequent flare-ups. Still possible to adapt and function close to normal. It can be very annoying and may have occasional stronger flare-ups. With discipline, patients may get used to it and adapt.   Moderate pain 3 Interferes significantly with activities of daily living (ADL). It becomes difficult to feed, bathe, get dressed, get on and off the toilet or to perform personal hygiene functions. Difficult to get in and out of bed or a chair without assistance. Very distracting. With effort, it can be ignored when deeply involved in activities.   Moderately severe pain 4 Impossible to ignore for more than a few minutes. With effort, patients may still be able to manage work or participate in some social activities. Very difficult to concentrate. Signs of autonomic nervous system discharge are evident: dilated pupils (mydriasis); mild sweating (diaphoresis); sleep interference. Heart rate becomes elevated (>115 bpm). Diastolic blood pressure (lower number) rises above 100 mmHg. Patients find relief in laying down and not moving.   Severe pain 5 Intense and extremely unpleasant. Associated with frowning face and frequent crying. Pain overwhelms the senses.  Ability to do any activity or maintain social relationships becomes significantly limited. Conversation becomes difficult. Pacing back and forth is common, as getting into a comfortable position is nearly impossible. Pain wakes you up from deep sleep. Physical signs will be obvious: pupillary dilation; increased sweating; goosebumps; brisk reflexes; cold, clammy hands and feet; nausea, vomiting or dry heaves; loss of appetite; significant sleep disturbance with inability to fall asleep or to remain asleep. When persistent, significant weight loss is observed due to the complete loss of appetite and sleep deprivation.  Blood pressure and heart rate becomes significantly elevated. Caution: If elevated blood pressure triggers a pounding headache  associated with blurred vision, then the patient should immediately seek attention at an urgent or emergency care unit, as these may be signs of an impending stroke.    Emergency Department Pain Levels (6-10/10)  Emergency Room Pain 6 Severely limiting. Requires emergency care and should not be seen or managed at an outpatient pain management facility. Communication becomes difficult and requires great effort. Assistance to reach the emergency department may be required. Facial flushing and profuse sweating along with potentially dangerous increases in heart rate and blood pressure will be evident.   Distressing pain 7 Self-care is very difficult. Assistance is required to transport, or use restroom. Assistance to reach the emergency department will be required. Tasks requiring coordination, such as bathing and getting dressed become very difficult.   Disabling pain 8 Self-care is no longer possible. At this level, pain is disabling. The individual is unable to do even the most "basic" activities such as walking, eating, bathing, dressing, transferring to a bed, or toileting. Fine motor skills are lost. It is difficult to think clearly.   Incapacitating pain 9 Pain becomes incapacitating. Thought processing is no longer possible. Difficult to remember your own name. Control of movement and coordination are lost.   The worst pain imaginable 10 At this level, most patients pass out from pain. When this level is reached, collapse of the autonomic nervous system occurs, leading to a sudden drop in blood pressure and heart rate. This in turn results in a temporary and dramatic drop in blood flow to the brain, leading to a loss of consciousness. Fainting is one of the body's self defense mechanisms. Passing out puts the brain in a calmed state and causes it to shut down for a while, in order to begin the healing process.    Summary: 1. Refer to this scale when providing [place] with your pain level. 2. Be  accurate and careful when reporting your pain level. This will help with your care. 3. Over-reporting your pain level will lead to loss of credibility. 4. Even a level of 1/10 means that there is pain and will be treated at our facility. 5. High, inaccurate reporting will be documented as "Symptom Exaggeration", leading to loss of credibility and suspicions of possible secondary gains such as obtaining more narcotics, or wanting to appear disabled, for fraudulent reasons. 6. Only pain levels of 5 or below will be seen at our facility. 7. Pain levels of 6 and above will be sent to the Emergency Department and the appointment cancelled. ____________________________________________________________________________________________   BMI Assessment: Estimated body mass index is 33.99 kg/m as calculated from the following:   Height as of this encounter: 5\' 4"  (1.626 m).   Weight as of this encounter: 198 lb (89.8 kg).  BMI interpretation table: BMI level Category Range association with higher incidence of chronic pain  <18 kg/m2 Underweight   18.5-24.9 kg/m2 Ideal body weight   25-29.9 kg/m2 Overweight Increased incidence by 20%  30-34.9 kg/m2 Obese (Class I) Increased incidence by 68%  35-39.9 kg/m2 Severe obesity (Class II) Increased incidence by 136%  >40 kg/m2 Extreme obesity (Class III) Increased incidence by 254%   Patient's current BMI Ideal Body weight  Body mass index is 33.99 kg/m. Ideal body weight: 54.7 kg (120 lb 9.5 oz) Adjusted ideal body weight: 68.7 kg (151 lb 8.9 oz)   BMI Readings from Last 4 Encounters:  04/26/18 33.99  kg/m  02/28/18 32.61 kg/m  01/12/18 33.99 kg/m  12/20/17 32.79 kg/m   Wt Readings from Last 4 Encounters:  04/26/18 198 lb (89.8 kg)  02/28/18 190 lb (86.2 kg)  01/12/18 198 lb (89.8 kg)  12/20/17 191 lb (86.6 kg)   She admits that her left knee. She is SP arthtoscopic surgeruy. She admits that she was told that her right knee is worse than the  left. She admtis that her knee and back pain are about the sam.e She admits that she having a lot of tingling and numbness in hands and feet. She admits that she is using the Gabapentin TID. She admits that it makes her sleepy. She feels like it is effective.  She does not feel like none of those thing are working. She admits that the [place] powder is more effective. She is not using that very often.  She admits that she does the home exercises. ____________________________________________________________________________________________  General Risks and Possible Complications  Patient Responsibilities: It is important that you read this as it is part of your informed consent. It is our duty to inform you of the risks and possible complications associated with treatments offered to you. It is your responsibility as a patient to read this and to ask questions about anything that is not clear or that you believe was not covered in this document.  Patient's Rights: You have the right to refuse treatment. You also have the right to change your mind, even after initially having agreed to have the treatment done. However, under this last option, if you wait until the last second to change your mind, you may be charged for the materials used up to that point.  Introduction: Medicine is not an Chief Strategy Officer. Everything in Medicine, including the lack of treatment(s), carries the potential for danger, harm, or loss (which is by definition: Risk). In Medicine, a complication is a secondary problem, condition, or disease that can aggravate an already existing one. All treatments carry the risk of possible complications. The fact that a side effects or complications occurs, does not imply that the treatment was conducted incorrectly. It must be clearly understood that these can happen even when everything is done following the highest safety standards.  No treatment: You can choose not to proceed with the proposed  treatment alternative. The "PRO(s)" would include: avoiding the risk of complications associated with the therapy. The "CON(s)" would include: not getting any of the treatment benefits. These benefits fall under one of three categories: diagnostic; therapeutic; and/or palliative. Diagnostic benefits include: getting information which can ultimately lead to improvement of the disease or symptom(s). Therapeutic benefits are those associated with the successful treatment of the disease. Finally, palliative benefits are those related to the decrease of the primary symptoms, without necessarily curing the condition (example: decreasing the pain from a flare-up of a chronic condition, such as incurable terminal cancer).  General Risks and Complications: These are associated to most interventional treatments. They can occur alone, or in combination. They fall under one of the following six (6) categories: no benefit or worsening of symptoms; bleeding; infection; nerve damage; allergic reactions; and/or death. 1. No benefits or worsening of symptoms: In Medicine there are no guarantees, only probabilities. No healthcare provider can ever guarantee that a medical treatment will work, they can only state the probability that it may. Furthermore, there is always the possibility that the condition may worsen, either directly, or indirectly, as a consequence of the treatment. 2. Bleeding: This is  more common if the patient is taking a blood thinner, either prescription or over the counter (example: Goody Powders, Fish oil, Aspirin, Garlic, etc.), or if suffering a condition associated with impaired coagulation (example: Hemophilia, cirrhosis of the liver, low platelet counts, etc.). However, even if you do not have one on these, it can still happen. If you have any of these conditions, or take one of these drugs, make sure to notify your treating physician. 3. Infection: This is more common in patients with a compromised  immune system, either due to disease (example: diabetes, cancer, human immunodeficiency virus [HIV], etc.), or due to medications or treatments (example: therapies used to treat cancer and rheumatological diseases). However, even if you do not have one on these, it can still happen. If you have any of these conditions, or take one of these drugs, make sure to notify your treating physician. 4. Nerve Damage: This is more common when the treatment is an invasive one, but it can also happen with the use of medications, such as those used in the treatment of cancer. The damage can occur to small secondary nerves, or to large primary ones, such as those in the spinal cord and brain. This damage may be temporary or permanent and it may lead to impairments that can range from temporary numbness to permanent paralysis and/or brain death. 5. Allergic Reactions: Any time a substance or material comes in contact with our body, there is the possibility of an allergic reaction. These can range from a mild skin rash (contact dermatitis) to a severe systemic reaction (anaphylactic reaction), which can result in death. 6. Death: In general, any medical intervention can result in death, most of the time due to an unforeseen complication. ____________________________________________________________________________________________  Epidural Steroid Injection Patient Information  Description: The epidural space surrounds the nerves as they exit the spinal cord.  In some patients, the nerves can be compressed and inflamed by a bulging disc or a tight spinal canal (spinal stenosis).  By injecting steroids into the epidural space, we can bring irritated nerves into direct contact with a potentially helpful medication.  These steroids act directly on the irritated nerves and can reduce swelling and inflammation which often leads to decreased pain.  Epidural steroids may be injected anywhere along the spine and from the neck to the  low back depending upon the location of your pain.   After numbing the skin with local anesthetic (like Novocaine), a small needle is passed into the epidural space slowly.  You may experience a sensation of pressure while this is being done.  The entire block usually last less than 10 minutes.  Conditions which may be treated by epidural steroids:   Low back and leg pain  Neck and arm pain  Spinal stenosis  Post-laminectomy syndrome  Herpes zoster (shingles) pain  Pain from compression fractures  Preparation for the injection:  1. Do not eat any solid food or dairy products within 8 hours of your appointment.  2. You may drink clear liquids up to 3 hours before appointment.  Clear liquids include water, black coffee, juice or soda.  No milk or cream please. 3. You may take your regular medication, including pain medications, with a sip of water before your appointment  Diabetics should hold regular insulin (if taken separately) and take 1/2 normal NPH dos the morning of the procedure.  Carry some sugar containing items with you to your appointment. 4. A driver must accompany you and be prepared to drive  you home after your procedure.  5. Bring all your current medications with your. 6. An IV may be inserted and sedation may be given at the discretion of the physician.   7. A blood pressure cuff, EKG and other monitors will often be applied during the procedure.  Some patients may need to have extra oxygen administered for a short period. 8. You will be asked to provide medical information, including your allergies, prior to the procedure.  We must know immediately if you are taking blood thinners (like Coumadin/Warfarin)  Or if you are allergic to IV iodine contrast (dye). We must know if you could possible be pregnant.  Possible side-effects:  Bleeding from needle site  Infection (rare, may require surgery)  Nerve injury (rare)  Numbness & tingling (temporary)  Difficulty  urinating (rare, temporary)  Spinal headache ( a headache worse with upright posture)  Light -headedness (temporary)  Pain at injection site (several days)  Decreased blood pressure (temporary)  Weakness in arm/leg (temporary)  Pressure sensation in back/neck (temporary)  Call if you experience:  Fever/chills associated with headache or increased back/neck pain.  Headache worsened by an upright position.  New onset weakness or numbness of an extremity below the injection site  Hives or difficulty breathing (go to the emergency room)  Inflammation or drainage at the infection site  Severe back/neck pain  Any new symptoms which are concerning to you  Please note:  Although the local anesthetic injected can often make your back or neck feel good for several hours after the injection, the pain will likely return.  It takes 3-7 days for steroids to work in the epidural space.  You may not notice any pain relief for at least that one week.  If effective, we will often do a series of three injections spaced 3-6 weeks apart to maximally decrease your pain.  After the initial series, we generally will wait several months before considering a repeat injection of the same type.  If you have any questions, please call [phone] [facility]

## 2018-04-26 NOTE — Progress Notes (Signed)
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 Hemophilia, cirrhosis of the liver, low platelet counts, etc.). However, even if you do not have one on these, it can still happen.  If you have any of these conditions, or take one of these drugs, make sure to notify your treating physician. 3. Infection: This is more common in patients with a compromised immune system, either due to disease (example: diabetes, cancer, human immunodeficiency virus [HIV], etc.), or due to medications or treatments (example: therapies used to treat cancer and rheumatological diseases). However, even if you do not have one on these, it can still happen. If you have any of these conditions, or take one of these drugs, make sure to notify your treating physician. 4. Nerve Damage: This is more common when the treatment is an invasive one, but it can also happen with the use of medications, such as those used in the treatment of cancer. The damage can occur to small secondary nerves, or to large primary ones, such as those in the spinal cord and brain. This damage may be temporary or permanent and it may lead to impairments  that can range from temporary numbness to permanent paralysis and/or brain death. 5. Allergic Reactions: Any time a substance or material comes in contact with our body, there is the possibility of an allergic reaction. These can range from a mild skin rash (contact dermatitis) to a severe systemic reaction (anaphylactic reaction), which can result in death. 6. Death: In general, any medical intervention can result in death, most of the time due to an unforeseen complication. ____________________________________________________________________________________________  Epidural Steroid Injection Patient Information  Description: The epidural space surrounds the nerves as they exit the spinal cord.  In some patients, the nerves can be compressed and inflamed by a bulging disc or a tight spinal canal (spinal stenosis).  By injecting steroids into the epidural space, we can bring irritated nerves into direct contact with a potentially helpful medication.  These steroids act directly on the irritated nerves and can reduce swelling and inflammation which often leads to decreased pain.  Epidural steroids may be injected anywhere along the spine and from the neck to the low back depending upon the location of your pain.   After numbing the skin with local anesthetic (like Novocaine), a small needle is passed into the epidural space slowly.  You may experience a sensation of pressure while this is being done.  The entire block usually last less than 10 minutes.  Conditions which may be treated by epidural steroids:   Low back and leg pain  Neck and arm pain  Spinal stenosis  Post-laminectomy syndrome  Herpes zoster (shingles) pain  Pain from compression fractures  Preparation for the injection:  1. Do not eat any solid food or dairy products within 8 hours of your appointment.  2. You may drink clear liquids up to 3 hours before appointment.  Clear liquids include water, black coffee, juice or  soda.  No milk or cream please. 3. You may take your regular medication, including pain medications, with a sip of water before your appointment  Diabetics should hold regular insulin (if taken separately) and take 1/2 normal NPH dos the morning of the procedure.  Carry some sugar containing items with you to your appointment. 4. A driver must accompany you and be prepared to drive you home after your procedure.  5. Bring all your current medications with your. 6. An IV may be inserted and sedation may be given at the discretion of the physician.   7. A blood pressure cuff, EKG and other monitors will often be applied during the procedure.  Some patients may need to have extra  oxygen administered for a short period. 8. You will be asked to provide medical information, including your allergies, prior to the procedure.  We must know immediately if you are taking blood thinners (like Coumadin/Warfarin)  Or if you are allergic to IV iodine contrast (dye). We must know if you could possible be pregnant.  Possible side-effects:  Bleeding from needle site  Infection (rare, may require surgery)  Nerve injury (rare)  Numbness & tingling (temporary)  Difficulty urinating (rare, temporary)  Spinal headache ( a headache worse with upright posture)  Light -headedness (temporary)  Pain at injection site (several days)  Decreased blood pressure (temporary)  Weakness in arm/leg (temporary)  Pressure sensation in back/neck (temporary)  Call if you experience:  Fever/chills associated with headache or increased back/neck pain.  Headache worsened by an upright position.  New onset weakness or numbness of an extremity below the injection site  Hives or difficulty breathing (go to the emergency room)  Inflammation or drainage at the infection site  Severe back/neck pain  Any new symptoms which are concerning to you  Please note:  Although the local anesthetic injected can often make your  back or neck feel good for several hours after the injection, the pain will likely return.  It takes 3-7 days for steroids to work in the epidural space.  You may not notice any pain relief for at least that one week.  If effective, we will often do a series of three injections spaced 3-6 weeks apart to maximally decrease your pain.  After the initial series, we generally will wait several months before considering a repeat injection of the same type.  If you have any questions, please call [phone] [facility]

## 2018-04-26 NOTE — Progress Notes (Signed)
Safety precautions to be maintained throughout the outpatient stay will include: orient to surroundings, keep bed in low position, maintain call bell within reach at all times, provide assistance with transfer out of bed and ambulation.  

## 2018-05-10 ENCOUNTER — Ambulatory Visit
Payer: Medicaid Other | Attending: Nurse Practitioner | Admitting: Student in an Organized Health Care Education/Training Program

## 2018-07-25 ENCOUNTER — Ambulatory Visit: Payer: Medicaid Other | Admitting: Nurse Practitioner

## 2018-09-12 ENCOUNTER — Other Ambulatory Visit: Payer: Self-pay

## 2018-09-12 ENCOUNTER — Encounter: Payer: Self-pay | Admitting: Emergency Medicine

## 2018-09-12 ENCOUNTER — Emergency Department
Admission: EM | Admit: 2018-09-12 | Discharge: 2018-09-12 | Disposition: A | Discharge status: Home / Self Care | Payer: Medicaid Other | Attending: Emergency Medicine | Admitting: Emergency Medicine

## 2018-09-12 DIAGNOSIS — M5431 Sciatica, right side: Secondary | ICD-10-CM

## 2018-09-12 DIAGNOSIS — M545 Low back pain: Secondary | ICD-10-CM | POA: Insufficient documentation

## 2018-09-12 DIAGNOSIS — M5440 Lumbago with sciatica, unspecified side: Secondary | ICD-10-CM

## 2018-09-12 DIAGNOSIS — M5430 Sciatica, unspecified side: Secondary | ICD-10-CM | POA: Insufficient documentation

## 2018-09-12 DIAGNOSIS — Z79899 Other long term (current) drug therapy: Secondary | ICD-10-CM | POA: Insufficient documentation

## 2018-09-12 DIAGNOSIS — G8929 Other chronic pain: Secondary | ICD-10-CM | POA: Insufficient documentation

## 2018-09-12 DIAGNOSIS — I10 Essential (primary) hypertension: Secondary | ICD-10-CM | POA: Diagnosis not present

## 2018-09-12 DIAGNOSIS — E119 Type 2 diabetes mellitus without complications: Secondary | ICD-10-CM | POA: Insufficient documentation

## 2018-09-12 MED ORDER — PREDNISONE 20 MG PO TABS
60.0000 mg | ORAL_TABLET | Freq: Once | ORAL | Status: AC
  Administered 2018-09-12: 60 mg via ORAL
  Filled 2018-09-12: qty 3

## 2018-09-12 MED ORDER — PREDNISONE 10 MG PO TABS: ORAL_TABLET | ORAL | 0 refills | Status: DC

## 2018-09-12 MED ORDER — ORPHENADRINE CITRATE 30 MG/ML IJ SOLN
60.0000 mg | Freq: Two times a day (BID) | INTRAMUSCULAR | Status: DC
  Administered 2018-09-12: 60 mg via INTRAMUSCULAR
  Filled 2018-09-12: qty 2

## 2018-09-12 MED ORDER — OXYCODONE-ACETAMINOPHEN 5-325 MG PO TABS: 1.0000 | ORAL_TABLET | Freq: Four times a day (QID) | ORAL | 0 refills | Status: DC | PRN

## 2018-09-12 MED ORDER — OXYCODONE-ACETAMINOPHEN 5-325 MG PO TABS
1.0000 | ORAL_TABLET | Freq: Once | ORAL | Status: AC
Start: 2018-09-12 — End: 2018-09-12
  Administered 2018-09-12: 1 via ORAL
  Filled 2018-09-12: qty 1
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
## 2018-09-12 NOTE — ED Notes (Signed)
Pt presents to ED from home with c/c of acute low back pain. Pt states Sx started last night when she bent over to pick up laundry and "felt a pop" in her low back. She reports a Hx of multiple similar events involving LBP. Sx include a throbbing ache evenly across her low back and sharp shooting pains radiating down bilateral legs. Denies any Hx of renal stones, UTI, changes in urinary or bowel patterns.

## 2018-09-12 NOTE — ED Triage Notes (Signed)
sayus she moved the wrong way yesterday and threw her back out.  Low back pain

## 2018-09-12 NOTE — Discharge Instructions (Signed)
Follow-up with your primary care provider if any continued problems.  Begin taking medication only as directed.  The first dose of prednisone was given to you in the ED and your next dose will not be until tomorrow.  Continue your Flexeril as needed for muscle spasms.  You may use ice or heat to your back as needed for discomfort.  Percocet is 1 every 6 hours as needed for severe pain.  You may also need to see an orthopedist about your back.  Your doctor will need to make referral to the orthopedist of your choice.  Posey Pronto at [facility] orth[facility] is on call today if your doctor needs to know who we would have referred you to.

## 2018-09-16 ENCOUNTER — Ambulatory Visit: Payer: Medicaid Other

## 2018-09-16 ENCOUNTER — Ambulatory Visit
Admission: EM | Admit: 2018-09-16 | Discharge: 2018-09-16 | Disposition: A | Discharge status: Home / Self Care | Payer: Medicaid Other | Attending: Emergency Medicine | Admitting: Emergency Medicine

## 2018-09-16 ENCOUNTER — Other Ambulatory Visit: Payer: Self-pay

## 2018-09-16 DIAGNOSIS — R109 Unspecified abdominal pain: Secondary | ICD-10-CM | POA: Insufficient documentation

## 2018-09-16 DIAGNOSIS — N3001 Acute cystitis with hematuria: Secondary | ICD-10-CM

## 2018-09-16 DIAGNOSIS — M545 Low back pain, unspecified: Secondary | ICD-10-CM

## 2018-09-16 DIAGNOSIS — R918 Other nonspecific abnormal finding of lung field: Secondary | ICD-10-CM | POA: Diagnosis not present

## 2018-09-16 DIAGNOSIS — R161 Splenomegaly, not elsewhere classified: Secondary | ICD-10-CM | POA: Diagnosis not present

## 2018-09-16 DIAGNOSIS — N760 Acute vaginitis: Secondary | ICD-10-CM | POA: Diagnosis not present

## 2018-09-16 LAB — CBC WITH DIFFERENTIAL/PLATELET
Abs Immature Granulocytes: 0.14 10*3/uL — ABNORMAL HIGH (ref 0.00–0.07)
Basophils Absolute: 0 10*3/uL (ref 0.0–0.1)
Basophils Relative: 0 %
EOS PCT: 0 %
Eosinophils Absolute: 0 10*3/uL (ref 0.0–0.5)
HEMATOCRIT: 32.2 % — AB (ref 36.0–46.0)
Hemoglobin: 10.8 g/dL — ABNORMAL LOW (ref 12.0–15.0)
Immature Granulocytes: 2 %
Lymphocytes Relative: 11 %
Lymphs Abs: 0.7 10*3/uL (ref 0.7–4.0)
MCH: 26.8 pg (ref 26.0–34.0)
MCHC: 33.5 g/dL (ref 30.0–36.0)
MCV: 79.9 fL — ABNORMAL LOW (ref 80.0–100.0)
MONO ABS: 0.6 10*3/uL (ref 0.1–1.0)
Monocytes Relative: 9 %
Neutro Abs: 5.4 10*3/uL (ref 1.7–7.7)
Neutrophils Relative %: 78 %
Platelets: 192 10*3/uL (ref 150–400)
RBC: 4.03 MIL/uL (ref 3.87–5.11)
RDW: 13.9 % (ref 11.5–15.5)
WBC: 6.9 10*3/uL (ref 4.0–10.5)
nRBC: 0 % (ref 0.0–0.2)

## 2018-09-16 LAB — URINALYSIS, COMPLETE (UACMP) WITH MICROSCOPIC
Bilirubin Urine: NEGATIVE
Glucose, UA: NEGATIVE mg/dL
Ketones, ur: NEGATIVE mg/dL
Leukocytes, UA: NEGATIVE
Nitrite: NEGATIVE
Protein, ur: 100 mg/dL — AB
RBC / HPF: >50 RBC/hpf (ref 0–5)
Specific Gravity, Urine: 1.015 (ref 1.005–1.030)
pH: 5.5 (ref 5.0–8.0)

## 2018-09-16 LAB — WET PREP, GENITAL
Sperm: NONE SEEN
Trich, Wet Prep: NONE SEEN

## 2018-09-16 LAB — COMPREHENSIVE METABOLIC PANEL
ALT: 14 U/L (ref 0–44)
AST: 24 U/L (ref 15–41)
Albumin: 2.7 g/dL — ABNORMAL LOW (ref 3.5–5.0)
Alkaline Phosphatase: 56 U/L (ref 38–126)
Anion gap: 8 (ref 5–15)
BILIRUBIN TOTAL: 0.5 mg/dL (ref 0.3–1.2)
BUN: 27 mg/dL — ABNORMAL HIGH (ref 6–20)
CO2: 19 mmol/L — ABNORMAL LOW (ref 22–32)
CREATININE: 1.37 mg/dL — AB (ref 0.44–1.00)
Calcium: 9 mg/dL (ref 8.9–10.3)
Chloride: 105 mmol/L (ref 98–111)
GFR calc Af Amer: 52 mL/min — ABNORMAL LOW (ref >60)
GFR calc non Af Amer: 45 mL/min — ABNORMAL LOW (ref >60)
Glucose, Bld: 138 mg/dL — ABNORMAL HIGH (ref 70–99)
Potassium: 4 mmol/L (ref 3.5–5.1)
Sodium: 132 mmol/L — ABNORMAL LOW (ref 135–145)
Total Protein: 10.2 g/dL — ABNORMAL HIGH (ref 6.5–8.1)

## 2018-09-16 MED ORDER — FLUCONAZOLE 150 MG PO TABS: 150.0000 mg | ORAL_TABLET | ORAL | 0 refills | Status: AC

## 2018-09-16 MED ORDER — MELOXICAM 15 MG PO TABS: 15.0000 mg | ORAL_TABLET | Freq: Every day | ORAL | 0 refills | Status: AC

## 2018-09-16 MED ORDER — METRONIDAZOLE 500 MG PO TABS: 500.0000 mg | ORAL_TABLET | Freq: Two times a day (BID) | ORAL | 0 refills | Status: AC

## 2018-09-16 MED ORDER — SULFAMETHOXAZOLE-TRIMETHOPRIM 800-160 MG PO TABS: 1.0000 | ORAL_TABLET | Freq: Two times a day (BID) | ORAL | 0 refills | Status: AC

## 2018-09-16 MED ORDER — TIZANIDINE HCL 4 MG PO TABS: 4.0000 mg | ORAL_TABLET | Freq: Three times a day (TID) | ORAL | 0 refills | Status: AC | PRN

## 2018-09-16 MED ORDER — KETOROLAC TROMETHAMINE 60 MG/2ML IM SOLN
60.0000 mg | Freq: Once | INTRAMUSCULAR | Status: AC
  Administered 2018-09-16: 60 mg via INTRAMUSCULAR

## 2018-09-16 NOTE — Discharge Instructions (Addendum)
DYSURIA/HEMATURIA/BACK PAIN: A UA revealed blood so a CT was performed to assess for possible renal stone. There were no renal stones detected. Your urine has been sent for a culture. Will treat you for UTI with 5 days of Bactrim. Increase fluid intake. Go to ER for fever, increased back pain, nausea/vomiting, chills, worsening fatigue.  VAGINITIS: A wet prep test shows that you have a vaginal yeast infection as well as a bacterial vaginosis infection. Begin fluconazole and metronidazole to treat both conditions.   BACK PAIN: Your back pain may still be musculoskeletal in origin so stop other muscle relaxers and begin tizanidine. You were given ketorolac in the clinic for pain relief today. Take meloxicam daily for inflammation and back pain. Go to ER for worsening of back pain, weakness of legs, loss of bowel or bladder control, tingling and numbness of genital area/buttocks.   PLEASE CALL YOUR PCP Monday TO FOLLOW UP AND GET RE-EXAMINED. GO IMMEDIATELY TO ER TOMORROW IF ANY SYMPTOMS ARE WORSE.   ALSO THERE WERE SOME NODULES SEEN IN THE LUNGS ON THE CT SCAN. PLEASE FOLLOW UP WITH YOUR PCP ABOUT THIS. THIS MAY NOT BE NEW. GO TO ER FOR BREATHING DIFFICULTY OR CHEST PAIN.

## 2018-09-16 NOTE — ED Triage Notes (Addendum)
Pt seen at [facility] ER last week for same. Diagnosed with sciatica. Now she reports dark urine, dysuria. Also having white vaginal discharge and some itching. Pain 10/10. Finished Percocet about 2-3 days ago
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
## 2018-09-18 LAB — URINE CULTURE

## 2018-10-07 ENCOUNTER — Emergency Department
Admission: EM | Admit: 2018-10-07 | Discharge: 2018-10-07 | Disposition: A | Discharge status: Home / Self Care | Payer: Medicaid Other | Attending: Emergency Medicine | Admitting: Emergency Medicine

## 2018-10-07 ENCOUNTER — Encounter: Payer: Self-pay | Admitting: Emergency Medicine

## 2018-10-07 DIAGNOSIS — N898 Other specified noninflammatory disorders of vagina: Secondary | ICD-10-CM | POA: Diagnosis not present

## 2018-10-07 DIAGNOSIS — Z79899 Other long term (current) drug therapy: Secondary | ICD-10-CM | POA: Diagnosis not present

## 2018-10-07 DIAGNOSIS — R350 Frequency of micturition: Secondary | ICD-10-CM | POA: Insufficient documentation

## 2018-10-07 DIAGNOSIS — R300 Dysuria: Secondary | ICD-10-CM | POA: Diagnosis present

## 2018-10-07 DIAGNOSIS — I10 Essential (primary) hypertension: Secondary | ICD-10-CM | POA: Diagnosis not present

## 2018-10-07 LAB — URINALYSIS, COMPLETE (UACMP) WITH MICROSCOPIC: Specific Gravity, Urine: 1.021 (ref 1.005–1.030)

## 2018-10-07 MED ORDER — SULFAMETHOXAZOLE-TRIMETHOPRIM 400-80 MG PO TABS: 1.0000 | ORAL_TABLET | Freq: Two times a day (BID) | ORAL | 0 refills | Status: DC

## 2018-10-07 MED ORDER — SULFAMETHOXAZOLE-TRIMETHOPRIM 800-160 MG PO TABS
1.0000 | ORAL_TABLET | Freq: Once | ORAL | Status: AC
  Administered 2018-10-07: 1 via ORAL
  Filled 2018-10-07: qty 1

## 2018-10-07 MED ORDER — ONDANSETRON HCL 4 MG PO TABS: 4.0000 mg | ORAL_TABLET | Freq: Three times a day (TID) | ORAL | 0 refills | Status: DC | PRN

## 2018-10-07 MED ORDER — ONDANSETRON HCL 4 MG PO TABS
4.0000 mg | ORAL_TABLET | Freq: Once | ORAL | Status: AC
  Administered 2018-10-07: 4 mg via ORAL
  Filled 2018-10-07: qty 1

## 2018-10-07 NOTE — ED Triage Notes (Signed)
Patient with complaint of pain with urination and frequency times a week. Patient states that she was diagnosed with a UTI about 4 weeks ago and finished the antibiotics but feels like it is coming back.

## 2018-10-07 NOTE — ED Triage Notes (Signed)
Pt arrives via [facility] with c/o UTI. Pt has been treated with Keflex and Meloxicam at this time but states that pain is still present. Pt has all other VS WDL according to EMS. Pt is in NAD.

## 2018-10-07 NOTE — ED Notes (Signed)
Patient aware that we need urine sample for testing, unable at this time. Pt given instruction on providing urine sample when able to do so.   

## 2018-10-07 NOTE — ED Notes (Signed)
Patient unable to urinate at this time. 
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
## 2018-10-07 NOTE — ED Notes (Signed)
ED Provider at bedside. 

## 2018-10-07 NOTE — Discharge Instructions (Addendum)
You are being treated for possible urinary tract infection.  We have given you 1 dose of antibiotics in the ER.  I have provided you with a prescription for Septra 1 tab daily for 5 days.  I am also given you a prescription for antinausea medicine.  Please push fluids.  Okay to take AZO over-the-counter as needed for dysuria.

## 2018-10-09 LAB — URINE CULTURE: Culture: NO GROWTH

## 2018-10-25 ENCOUNTER — Other Ambulatory Visit: Payer: Self-pay | Admitting: Sports Medicine

## 2018-10-25 DIAGNOSIS — M5440 Lumbago with sciatica, unspecified side: Secondary | ICD-10-CM

## 2018-11-01 ENCOUNTER — Ambulatory Visit
Admission: RE | Admit: 2018-11-01 | Discharge: 2018-11-01 | Disposition: A | Discharge status: Home / Self Care | Payer: Medicaid Other | Source: Ambulatory Visit | Attending: Sports Medicine | Admitting: Sports Medicine

## 2018-11-01 DIAGNOSIS — M5440 Lumbago with sciatica, unspecified side: Secondary | ICD-10-CM

## 2018-11-07 ENCOUNTER — Ambulatory Visit
Admission: RE | Admit: 2018-11-07 | Discharge: 2018-11-07 | Disposition: A | Discharge status: Home / Self Care | Payer: Medicaid Other | Source: Ambulatory Visit | Attending: Sports Medicine | Admitting: Sports Medicine

## 2018-11-09 ENCOUNTER — Other Ambulatory Visit: Payer: Self-pay | Admitting: Sports Medicine

## 2018-11-09 DIAGNOSIS — D869 Sarcoidosis, unspecified: Secondary | ICD-10-CM

## 2018-11-09 DIAGNOSIS — C799 Secondary malignant neoplasm of unspecified site: Secondary | ICD-10-CM

## 2018-11-09 DIAGNOSIS — A159 Respiratory tuberculosis unspecified: Secondary | ICD-10-CM

## 2018-11-09 DIAGNOSIS — C8598 Non-Hodgkin lymphoma, unspecified, lymph nodes of multiple sites: Secondary | ICD-10-CM

## 2018-11-10 ENCOUNTER — Other Ambulatory Visit: Payer: Medicaid Other

## 2018-11-13 ENCOUNTER — Other Ambulatory Visit: Payer: Medicaid Other

## 2018-11-20 ENCOUNTER — Other Ambulatory Visit: Payer: Medicaid Other

## 2018-11-30 ENCOUNTER — Other Ambulatory Visit: Payer: Medicaid Other

## 2018-11-30 ENCOUNTER — Ambulatory Visit
Admission: RE | Admit: 2018-11-30 | Discharge: 2018-11-30 | Disposition: A | Discharge status: Home / Self Care | Payer: Medicaid Other | Source: Ambulatory Visit | Attending: Sports Medicine | Admitting: Sports Medicine

## 2018-11-30 DIAGNOSIS — C8598 Non-Hodgkin lymphoma, unspecified, lymph nodes of multiple sites: Secondary | ICD-10-CM

## 2018-11-30 DIAGNOSIS — C799 Secondary malignant neoplasm of unspecified site: Secondary | ICD-10-CM

## 2018-11-30 DIAGNOSIS — A159 Respiratory tuberculosis unspecified: Secondary | ICD-10-CM

## 2018-11-30 DIAGNOSIS — D869 Sarcoidosis, unspecified: Secondary | ICD-10-CM

## 2018-11-30 MED ORDER — IOPAMIDOL (ISOVUE-300) INJECTION 61%
50.0000 mL | Freq: Once | INTRAVENOUS | Status: AC | PRN
  Administered 2018-11-30: 50 mL via INTRAVENOUS

## 2018-12-04 ENCOUNTER — Other Ambulatory Visit: Payer: Self-pay | Admitting: Sports Medicine

## 2018-12-04 DIAGNOSIS — M549 Dorsalgia, unspecified: Secondary | ICD-10-CM

## 2018-12-08 ENCOUNTER — Other Ambulatory Visit: Payer: Medicaid Other

## 2019-01-01 ENCOUNTER — Other Ambulatory Visit: Payer: Self-pay

## 2019-01-01 ENCOUNTER — Inpatient Hospital Stay: Payer: Medicaid Other | Attending: Oncology | Admitting: Oncology

## 2019-01-01 ENCOUNTER — Encounter: Payer: Self-pay | Admitting: Oncology

## 2019-01-01 ENCOUNTER — Other Ambulatory Visit: Payer: Self-pay | Admitting: *Deleted

## 2019-01-01 ENCOUNTER — Inpatient Hospital Stay: Payer: Medicaid Other

## 2019-01-01 VITALS — BP 123/89 | HR 103 | Temp 99.5°F | Resp 18 | Ht 64.0 in | Wt 189.9 lb

## 2019-01-01 DIAGNOSIS — R918 Other nonspecific abnormal finding of lung field: Secondary | ICD-10-CM | POA: Diagnosis not present

## 2019-01-01 DIAGNOSIS — Z79899 Other long term (current) drug therapy: Secondary | ICD-10-CM | POA: Diagnosis not present

## 2019-01-01 DIAGNOSIS — R937 Abnormal findings on diagnostic imaging of other parts of musculoskeletal system: Secondary | ICD-10-CM

## 2019-01-01 DIAGNOSIS — R161 Splenomegaly, not elsewhere classified: Secondary | ICD-10-CM | POA: Insufficient documentation

## 2019-01-01 DIAGNOSIS — I10 Essential (primary) hypertension: Secondary | ICD-10-CM | POA: Diagnosis not present

## 2019-01-01 LAB — CBC WITH DIFFERENTIAL/PLATELET
Abs Immature Granulocytes: 0.14 10*3/uL — ABNORMAL HIGH (ref 0.00–0.07)
Basophils Absolute: 0 10*3/uL (ref 0.0–0.1)
Basophils Relative: 0 %
Eosinophils Absolute: 0 10*3/uL (ref 0.0–0.5)
Eosinophils Relative: 0 %
HCT: 30 % — ABNORMAL LOW (ref 36.0–46.0)
Hemoglobin: 9.1 g/dL — ABNORMAL LOW (ref 12.0–15.0)
IMMATURE GRANULOCYTES: 3 %
Lymphocytes Relative: 40 %
Lymphs Abs: 1.9 10*3/uL (ref 0.7–4.0)
MCH: 26 pg (ref 26.0–34.0)
MCHC: 30.3 g/dL (ref 30.0–36.0)
MCV: 85.7 fL (ref 80.0–100.0)
MONOS PCT: 8 %
Monocytes Absolute: 0.4 10*3/uL (ref 0.1–1.0)
Neutro Abs: 2.2 10*3/uL (ref 1.7–7.7)
Neutrophils Relative %: 49 %
Platelets: 259 10*3/uL (ref 150–400)
RBC: 3.5 MIL/uL — ABNORMAL LOW (ref 3.87–5.11)
RDW: 20.6 % — ABNORMAL HIGH (ref 11.5–15.5)
WBC: 4.6 10*3/uL (ref 4.0–10.5)
nRBC: 0 % (ref 0.0–0.2)

## 2019-01-01 LAB — COMPREHENSIVE METABOLIC PANEL
ALT: 14 U/L (ref 0–44)
AST: 21 U/L (ref 15–41)
Albumin: 2.6 g/dL — ABNORMAL LOW (ref 3.5–5.0)
Alkaline Phosphatase: 96 U/L (ref 38–126)
Anion gap: 8 (ref 5–15)
BUN: 10 mg/dL (ref 6–20)
CO2: 25 mmol/L (ref 22–32)
Calcium: 9 mg/dL (ref 8.9–10.3)
Chloride: 106 mmol/L (ref 98–111)
Creatinine, Ser: 0.89 mg/dL (ref 0.44–1.00)
GFR calc Af Amer: >60 mL/min (ref >60)
GFR calc non Af Amer: >60 mL/min (ref >60)
Glucose, Bld: 83 mg/dL (ref 70–99)
Potassium: 3.4 mmol/L — ABNORMAL LOW (ref 3.5–5.1)
Sodium: 139 mmol/L (ref 135–145)
Total Bilirubin: 0.5 mg/dL (ref 0.3–1.2)
Total Protein: 8.9 g/dL — ABNORMAL HIGH (ref 6.5–8.1)

## 2019-01-01 LAB — LACTATE DEHYDROGENASE: LDH: 151 U/L (ref 98–192)
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
## 2019-01-02 LAB — MULTIPLE MYELOMA PANEL, SERUM
ALPHA 1: 0.3 g/dL (ref 0.0–0.4)
Albumin SerPl Elph-Mcnc: 2.7 g/dL — ABNORMAL LOW (ref 2.9–4.4)
Albumin/Glob SerPl: 0.5 — ABNORMAL LOW (ref 0.7–1.7)
Alpha2 Glob SerPl Elph-Mcnc: 1.2 g/dL — ABNORMAL HIGH (ref 0.4–1.0)
B-Globulin SerPl Elph-Mcnc: 1.1 g/dL (ref 0.7–1.3)
Gamma Glob SerPl Elph-Mcnc: 3 g/dL — ABNORMAL HIGH (ref 0.4–1.8)
Globulin, Total: 5.6 g/dL — ABNORMAL HIGH (ref 2.2–3.9)
IgA: 389 mg/dL — ABNORMAL HIGH (ref 87–352)
IgG (Immunoglobin G), Serum: 3339 mg/dL — ABNORMAL HIGH (ref 586–1602)
IgM (Immunoglobulin M), Srm: 713 mg/dL — ABNORMAL HIGH (ref 26–217)
Total Protein ELP: 8.3 g/dL (ref 6.0–8.5)

## 2019-01-02 LAB — KAPPA/LAMBDA LIGHT CHAINS
Kappa free light chain: 185.8 mg/L — ABNORMAL HIGH (ref 3.3–19.4)
Kappa, lambda light chain ratio: 1.22 (ref 0.26–1.65)
Lambda free light chains: 152.8 mg/L — ABNORMAL HIGH (ref 5.7–26.3)

## 2019-01-04 ENCOUNTER — Ambulatory Visit: Payer: Medicaid Other

## 2019-01-15 ENCOUNTER — Ambulatory Visit: Payer: Medicaid Other | Admitting: Oncology

## 2019-01-22 ENCOUNTER — Ambulatory Visit: Payer: Medicaid Other | Admitting: Oncology

## 2019-01-23 ENCOUNTER — Ambulatory Visit: Payer: Medicaid Other | Admitting: Oncology

## 2019-01-29 ENCOUNTER — Telehealth: Payer: Self-pay | Admitting: *Deleted

## 2019-01-29 NOTE — Telephone Encounter (Signed)
I called patient and went over labs-Dr.Rao states the labs do not indicate multiple myeloma. We have contacted the ortho dept. Dr. Rudene Christians came and spoke to Dr. Janese Banks about her case and said he would get her an appt. The appt has been made for this wed and patient knows about the appt. We will check her hgb at another time in future but dr Janese Banks would like to see if dr Rudene Christians will do bx on the abnormal MRI area. Wait to see future appts based on ortho plan. Patient agreeable to plan

## 2019-02-06 ENCOUNTER — Other Ambulatory Visit: Payer: Self-pay

## 2019-02-06 ENCOUNTER — Encounter
Admission: RE | Admit: 2019-02-06 | Discharge: 2019-02-06 | Disposition: A | Discharge status: Home / Self Care | Payer: Medicaid Other | Source: Ambulatory Visit | Attending: Orthopedic Surgery | Admitting: Orthopedic Surgery

## 2019-02-06 DIAGNOSIS — Z01818 Encounter for other preprocedural examination: Secondary | ICD-10-CM | POA: Diagnosis not present

## 2019-02-06 HISTORY — DX: Headache: R51

## 2019-02-06 HISTORY — DX: Cardiac murmur, unspecified: R01.1

## 2019-02-06 HISTORY — DX: Unspecified osteoarthritis, unspecified site: M19.90

## 2019-02-06 HISTORY — DX: Headache, unspecified: R51.9

## 2019-02-06 HISTORY — DX: Chronic obstructive pulmonary disease, unspecified: J44.9

## 2019-02-06 LAB — POTASSIUM: Potassium: 3.6 mmol/L (ref 3.5–5.1)

## 2019-02-06 LAB — HEMOGLOBIN: Hemoglobin: 11.4 g/dL — ABNORMAL LOW (ref 12.0–15.0)

## 2019-02-06 NOTE — Patient Instructions (Signed)
Your procedure is scheduled on: 02-08-19 THURSDAY Report to Same Day Surgery 2nd floor medical mall [facility] Entrance-take elevator on left to 2nd floor.  Check in with surgery information desk.) To find out your arrival time please call [phone] between 1PM - 3PM on 02-07-19 [facility]  Remember: Instructions that are not followed completely may result in serious medical risk, up to and including death, or upon the discretion of your surgeon and anesthesiologist your surgery may need to be rescheduled.    _x___ 1. Do not eat food after midnight the night before your procedure. NO GUM OR CANDY AFTER MIDNIGHT. You may drink clear liquids up to 2 hours before you are scheduled to arrive at the hospital for your procedure.  Do not drink clear liquids within 2 hours of your scheduled arrival to the hospital.  Clear liquids include  --Water or Apple juice without pulp  --Clear carbohydrate beverage such as ClearFast or Gatorade  --Black Coffee or Clear Tea (No milk, no creamers, do not add anything to the coffee or Tea   ____Ensure clear carbohydrate drink on the way to the hospital for bariatric patients  ____Ensure clear carbohydrate drink 3 hours before surgery for Dr Dwyane Luo patients if physician instructed.     __x__ 2. No Alcohol for 24 hours before or after surgery.   __x__3. No Smoking or e-cigarettes for 24 prior to surgery.  Do not use any chewable tobacco products for at least 6 hour prior to surgery   ____  4. Bring all medications with you on the day of surgery if instructed.    __x__ 5. Notify your doctor if there is any change in your medical condition     (cold, fever, infections).    x___6. On the morning of surgery brush your teeth with toothpaste and water.  You may rinse your mouth with mouth wash if you wish.  Do not swallow any toothpaste or mouthwash.   Do not wear jewelry, make-up, hairpins, clips or nail polish.  Do not wear lotions, powders, or perfumes.  You may wear deodorant.  Do not shave 48 hours prior to surgery. Men may shave face and neck.  Do not bring valuables to the hospital.    [facility] is not responsible for any belongings or valuables.               Contacts, dentures or bridgework may not be worn into surgery.  Leave your suitcase in the car. After surgery it may be brought to your room.  For patients admitted to the hospital, discharge time is determined by your treatment team.  _  Patients discharged the day of surgery will not be allowed to drive home.  You will need someone to drive you home and stay with you the night of your procedure.    Please read over the following fact sheets that you were given:   [facility] Preparing for Surgery  _x___ TAKE THE FOLLOWING MEDICATION THE MORNING OF SURGERY WITH A SMALL SIP OF WATER. These include:  1. AMLODIPINE (NORVASC)  2. WELLBUTRIN ( BUPROPION)  3. ZYRTEC (CETIRIZINE)  4. GABAPENTIN (NEURONTIN)  5. METOPROLOL  6. SINGULAIR (MONTELUKAST)  7. ZANTAC (RANITIDINE)  8.YOU MAY TAKE HYDROXYZINE AM OF SURGERY IF NEEDED  ____Fleets enema or Magnesium Citrate as directed.   _x___ Use CHG Soap or sage wipes as directed on instruction sheet   _X___ Use inhalers on the day of surgery and bring to hospital day of surgery-USE ALBUTEROL  NEBULIZER AM OF SURGERY ALONG WITH FLOVENT AND SPIRIVA  ____ Stop Metformin and Janumet 2 days prior to surgery.    ____ Take 1/2 of usual insulin dose the night before surgery and none on the morning surgery.   ____ Follow recommendations from Cardiologist, Pulmonologist or PCP regarding stopping Aspirin, Coumadin, Plavix ,Eliquis, Effient, or Pradaxa, and Pletal.  X____Stop Anti-inflammatories such as Advil, Aleve, Ibuprofen, Motrin, Naproxen, Naprosyn, Goodies powders or aspirin products NOW- OK to take Tylenol    ____ Stop supplements until after surgery.     ____ Bring C-Pap to the hospital.

## 2019-02-08 ENCOUNTER — Encounter: Payer: Self-pay | Admitting: *Deleted

## 2019-02-08 ENCOUNTER — Ambulatory Visit: Payer: Medicaid Other

## 2019-02-08 ENCOUNTER — Ambulatory Visit: Payer: Medicaid Other | Admitting: Anesthesiology

## 2019-02-08 ENCOUNTER — Encounter
Admission: RE | Disposition: A | Discharge status: Home / Self Care | Payer: Self-pay | Source: Home / Self Care | Attending: Orthopedic Surgery

## 2019-02-08 ENCOUNTER — Ambulatory Visit
Admission: RE | Admit: 2019-02-08 | Discharge: 2019-02-08 | Disposition: A | Discharge status: Home / Self Care | Payer: Medicaid Other | Attending: Orthopedic Surgery | Admitting: Orthopedic Surgery

## 2019-02-08 ENCOUNTER — Other Ambulatory Visit: Payer: Self-pay

## 2019-02-08 DIAGNOSIS — G4730 Sleep apnea, unspecified: Secondary | ICD-10-CM | POA: Insufficient documentation

## 2019-02-08 DIAGNOSIS — G43909 Migraine, unspecified, not intractable, without status migrainosus: Secondary | ICD-10-CM | POA: Insufficient documentation

## 2019-02-08 DIAGNOSIS — M545 Low back pain: Secondary | ICD-10-CM | POA: Insufficient documentation

## 2019-02-08 DIAGNOSIS — Z885 Allergy status to narcotic agent status: Secondary | ICD-10-CM | POA: Insufficient documentation

## 2019-02-08 DIAGNOSIS — Z79899 Other long term (current) drug therapy: Secondary | ICD-10-CM | POA: Insufficient documentation

## 2019-02-08 DIAGNOSIS — M1990 Unspecified osteoarthritis, unspecified site: Secondary | ICD-10-CM | POA: Diagnosis not present

## 2019-02-08 DIAGNOSIS — R7303 Prediabetes: Secondary | ICD-10-CM | POA: Diagnosis not present

## 2019-02-08 DIAGNOSIS — M4636 Infection of intervertebral disc (pyogenic), lumbar region: Secondary | ICD-10-CM | POA: Diagnosis present

## 2019-02-08 DIAGNOSIS — Z886 Allergy status to analgesic agent status: Secondary | ICD-10-CM | POA: Diagnosis not present

## 2019-02-08 DIAGNOSIS — K219 Gastro-esophageal reflux disease without esophagitis: Secondary | ICD-10-CM | POA: Diagnosis not present

## 2019-02-08 DIAGNOSIS — Z419 Encounter for procedure for purposes other than remedying health state, unspecified: Secondary | ICD-10-CM

## 2019-02-08 DIAGNOSIS — Z823 Family history of stroke: Secondary | ICD-10-CM | POA: Insufficient documentation

## 2019-02-08 DIAGNOSIS — D649 Anemia, unspecified: Secondary | ICD-10-CM | POA: Insufficient documentation

## 2019-02-08 DIAGNOSIS — Z8249 Family history of ischemic heart disease and other diseases of the circulatory system: Secondary | ICD-10-CM | POA: Diagnosis not present

## 2019-02-08 DIAGNOSIS — R011 Cardiac murmur, unspecified: Secondary | ICD-10-CM | POA: Insufficient documentation

## 2019-02-08 DIAGNOSIS — I10 Essential (primary) hypertension: Secondary | ICD-10-CM | POA: Diagnosis not present

## 2019-02-08 DIAGNOSIS — J449 Chronic obstructive pulmonary disease, unspecified: Secondary | ICD-10-CM | POA: Diagnosis not present

## 2019-02-08 HISTORY — PX: KYPHOPLASTY: SHX5884

## 2019-02-08 LAB — GLUCOSE, CAPILLARY
Glucose-Capillary: 76 mg/dL (ref 70–99)
Glucose-Capillary: 83 mg/dL (ref 70–99)

## 2019-02-08 SURGERY — KYPHOPLASTY
Anesthesia: General | Site: Back

## 2019-02-08 MED ORDER — OXYCODONE HCL 5 MG PO TABS: 5.0000 mg | ORAL_TABLET | ORAL | 0 refills | Status: DC | PRN

## 2019-02-08 MED ORDER — FENTANYL CITRATE (PF) 100 MCG/2ML IJ SOLN
INTRAMUSCULAR | Status: DC | PRN
  Administered 2019-02-08 (×4): 50 ug via INTRAVENOUS

## 2019-02-08 MED ORDER — FENTANYL CITRATE (PF) 100 MCG/2ML IJ SOLN
INTRAMUSCULAR | Status: AC
  Filled 2019-02-08: qty 2

## 2019-02-08 MED ORDER — LIDOCAINE HCL 1 % IJ SOLN
INTRAMUSCULAR | Status: DC | PRN
  Administered 2019-02-08: 20 mL
  Administered 2019-02-08: 10 mL

## 2019-02-08 MED ORDER — BUPIVACAINE-EPINEPHRINE (PF) 0.5% -1:200000 IJ SOLN
INTRAMUSCULAR | Status: AC
  Filled 2019-02-08: qty 30

## 2019-02-08 MED ORDER — LIDOCAINE 2% (20 MG/ML) 5 ML SYRINGE
INTRAMUSCULAR | Status: DC | PRN
  Administered 2019-02-08: 50 mg via INTRAVENOUS

## 2019-02-08 MED ORDER — MIDAZOLAM HCL 5 MG/5ML IJ SOLN
INTRAMUSCULAR | Status: DC | PRN
  Administered 2019-02-08: 2 mg via INTRAVENOUS

## 2019-02-08 MED ORDER — LIDOCAINE HCL (PF) 1 % IJ SOLN
INTRAMUSCULAR | Status: AC
  Filled 2019-02-08: qty 30

## 2019-02-08 MED ORDER — GLYCOPYRROLATE 0.2 MG/ML IJ SOLN
INTRAMUSCULAR | Status: DC | PRN
  Administered 2019-02-08: 0.2 mg via INTRAVENOUS

## 2019-02-08 MED ORDER — FENTANYL CITRATE (PF) 100 MCG/2ML IJ SOLN
25.0000 ug | INTRAMUSCULAR | Status: DC | PRN
  Administered 2019-02-08 (×2): 25 ug via INTRAVENOUS

## 2019-02-08 MED ORDER — FAMOTIDINE 20 MG PO TABS
ORAL_TABLET | ORAL | Status: AC
  Filled 2019-02-08: qty 1

## 2019-02-08 MED ORDER — OXYCODONE HCL 5 MG PO TABS: 5.0000 mg | ORAL_TABLET | Freq: Once | ORAL | Status: DC | PRN

## 2019-02-08 MED ORDER — DEXMEDETOMIDINE HCL IN NACL 200 MCG/50ML IV SOLN
INTRAVENOUS | Status: AC
  Filled 2019-02-08: qty 50

## 2019-02-08 MED ORDER — LIDOCAINE HCL (PF) 2 % IJ SOLN
INTRAMUSCULAR | Status: AC
  Filled 2019-02-08: qty 10

## 2019-02-08 MED ORDER — GLYCOPYRROLATE 0.2 MG/ML IJ SOLN
INTRAMUSCULAR | Status: AC
  Filled 2019-02-08: qty 1

## 2019-02-08 MED ORDER — PROMETHAZINE HCL 25 MG/ML IJ SOLN: 6.2500 mg | INTRAMUSCULAR | Status: DC | PRN

## 2019-02-08 MED ORDER — PROPOFOL 500 MG/50ML IV EMUL
INTRAVENOUS | Status: DC | PRN
  Administered 2019-02-08: 100 ug/kg/min via INTRAVENOUS

## 2019-02-08 MED ORDER — BUPIVACAINE-EPINEPHRINE (PF) 0.5% -1:200000 IJ SOLN
INTRAMUSCULAR | Status: DC | PRN
  Administered 2019-02-08: 20 mL via PERINEURAL

## 2019-02-08 MED ORDER — OXYCODONE HCL 5 MG/5ML PO SOLN: 5.0000 mg | Freq: Once | ORAL | Status: DC | PRN

## 2019-02-08 MED ORDER — MEPERIDINE HCL 50 MG/ML IJ SOLN: 6.2500 mg | INTRAMUSCULAR | Status: DC | PRN

## 2019-02-08 MED ORDER — EPHEDRINE SULFATE 50 MG/ML IJ SOLN
INTRAMUSCULAR | Status: AC
  Filled 2019-02-08: qty 1

## 2019-02-08 MED ORDER — KETAMINE HCL 50 MG/ML IJ SOLN
INTRAMUSCULAR | Status: AC
  Filled 2019-02-08: qty 10

## 2019-02-08 MED ORDER — FENTANYL CITRATE (PF) 100 MCG/2ML IJ SOLN
INTRAMUSCULAR | Status: AC
  Administered 2019-02-08: 25 ug via INTRAVENOUS
  Filled 2019-02-08: qty 2

## 2019-02-08 MED ORDER — FAMOTIDINE 20 MG PO TABS
20.0000 mg | ORAL_TABLET | Freq: Once | ORAL | Status: AC
  Administered 2019-02-08: 07:00:00 20 mg via ORAL

## 2019-02-08 MED ORDER — LACTATED RINGERS IV SOLN
INTRAVENOUS | Status: DC
  Administered 2019-02-08: 06:00:00 via INTRAVENOUS

## 2019-02-08 MED ORDER — DEXMEDETOMIDINE HCL IN NACL 200 MCG/50ML IV SOLN
INTRAVENOUS | Status: DC | PRN
  Administered 2019-02-08: 20 ug via INTRAVENOUS

## 2019-02-08 MED ORDER — MIDAZOLAM HCL 2 MG/2ML IJ SOLN
INTRAMUSCULAR | Status: AC
  Filled 2019-02-08: qty 2

## 2019-02-08 MED ORDER — PHENYLEPHRINE HCL (PRESSORS) 10 MG/ML IV SOLN
INTRAVENOUS | Status: AC
  Filled 2019-02-08: qty 1

## 2019-02-08 MED ORDER — OXYCODONE HCL 5 MG PO TABS
ORAL_TABLET | ORAL | Status: AC
  Filled 2019-02-08: qty 1

## 2019-02-08 MED ORDER — PROPOFOL 500 MG/50ML IV EMUL
INTRAVENOUS | Status: AC
  Filled 2019-02-08: qty 50

## 2019-02-08 SURGICAL SUPPLY — 23 items
COVER WAND RF STERILE (DRAPES) ×3 IMPLANT
DERMABOND ADVANCED (GAUZE/BANDAGES/DRESSINGS) ×2
DERMABOND ADVANCED .7 DNX12 (GAUZE/BANDAGES/DRESSINGS) ×1 IMPLANT
DEVICE BIOPSY BONE KYPH (INSTRUMENTS) ×3 IMPLANT
DEVICE BIOPSY BONE KYPHX (INSTRUMENTS) ×3 IMPLANT
DRAPE C-ARM XRAY 36X54 (DRAPES) ×3 IMPLANT
DURAPREP 26ML APPLICATOR (WOUND CARE) ×3 IMPLANT
GLOVE SURG SYN 9.0  PF PI (GLOVE) ×4
GLOVE SURG SYN 9.0 PF PI (GLOVE) ×2 IMPLANT
GOWN SRG 2XL LVL 4 RGLN SLV (GOWNS) ×1 IMPLANT
GOWN STRL NON-REIN 2XL LVL4 (GOWNS) ×2
GOWN STRL REUS W/ TWL LRG LVL3 (GOWN DISPOSABLE) ×2 IMPLANT
GOWN STRL REUS W/TWL LRG LVL3 (GOWN DISPOSABLE) ×4
KIT OSTEOCOOL BONE ACCESS 10G (MISCELLANEOUS) ×3 IMPLANT
KIT OSTEOCOOL BONE ACCESS 8G (MISCELLANEOUS) ×6 IMPLANT
PACK KYPHOPLASTY (MISCELLANEOUS) ×3 IMPLANT
RENTAL RFA  GENERATOR (MISCELLANEOUS)
RENTAL RFA GENERATOR (MISCELLANEOUS) IMPLANT
STRAP SAFETY 5IN WIDE (MISCELLANEOUS) ×3 IMPLANT
SWAB DUAL CULTURE TRANS RED ST (MISCELLANEOUS) ×3 IMPLANT
SYR 20CC LL (SYRINGE) ×3 IMPLANT
TRAY KYPHOPAK 15/3 EXPRESS 1ST (MISCELLANEOUS) ×3 IMPLANT
TRAY KYPHOPAK 20/3 EXPRESS 1ST (MISCELLANEOUS) ×3 IMPLANT

## 2019-02-08 NOTE — Discharge Instructions (Addendum)
Take it easy today and tomorrow.  Remove Band-Aids on Saturday.  Then resume activities as tolerated.  Call Monday for results of biopsy and culture  AMBULATORY SURGERY  DISCHARGE INSTRUCTIONS   1) The drugs that you were given will stay in your system until tomorrow so for the next 24 hours you should not:  A) Drive an automobile B) Make any legal decisions C) Drink any alcoholic beverage   2) You may resume regular meals tomorrow.  Today it is better to start with liquids and gradually work up to solid foods.  You may eat anything you prefer, but it is better to start with liquids, then soup and crackers, and gradually work up to solid foods.   3) Please notify your doctor immediately if you have any unusual bleeding, trouble breathing, redness and pain at the surgery site, drainage, fever, or pain not relieved by medication.    4) Additional Instructions:        Please contact your physician with any problems or Same Day Surgery at [phone], Monday through Friday 6 am to 4 pm, or [place] at [facility] number at [phone].

## 2019-02-08 NOTE — Anesthesia Postprocedure Evaluation (Signed)
Anesthesia Post Note  Patient: [name]  Procedure(s) Performed: LUMBAR SPINE BIOPSY, L4,L5,S1 - SLEEP APNEA (N/A Back)  Patient location during evaluation: PACU Anesthesia Type: General Level of consciousness: awake and alert and oriented Pain management: pain level controlled Vital Signs Assessment: post-procedure vital signs reviewed and stable Respiratory status: spontaneous breathing, nonlabored ventilation and respiratory function stable Cardiovascular status: blood pressure returned to baseline and stable Postop Assessment: no signs of nausea or vomiting Anesthetic complications: no     Last Vitals:  Vitals:   02/08/19 0924 02/08/19 0949  BP: 95/70 103/68  Pulse:  (!) 107  Resp: (!) 26   Temp:  36.6 C  SpO2:  96%    Last Pain:  Vitals:   02/08/19 0949  TempSrc: Temporal  PainSc: 8                  Briell Paulette

## 2019-02-08 NOTE — Op Note (Signed)
02/08/2019  8:39 AM  PATIENT:  Alicia Lynch  53 y.o. 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  POST-OPERATIVE DIAGNOSIS:  PYOGENIC INFECTION OF LUMBAR INTERVERTEBAL DISC same  PROCEDURE:  Procedure(s): LUMBAR SPINE BIOPSY, L4,L5,S1 - SLEEP APNEA (N/A)  SURGEON: Laurene Footman, MD  ASSISTANTS: none  ANESTHESIA:   local and MAC  EBL:  Total I/O In: 550 [I.V.:550] Out: -   BLOOD ADMINISTERED:none  DRAINS: none   LOCAL MEDICATIONS USED:  MARCAINE    and LIDOCAINE   SPECIMEN:  Source of Specimen:  L4-L5 S1 and disc spaces with cultures and biopsies obtained  DISPOSITION OF SPECIMEN:  Pathology and microbiology  COUNTS:  YES  TOURNIQUET:  * No tourniquets in log *  IMPLANTS: None  DICTATION: .Dragon Dictation patient was brought to the operating room and after adequate sedation was given she was placed prone.  C arm was brought into both AP and lateral projections and after appropriate patient identification and timeout procedures local anesthetic was infiltrated on both sides of the planned incisions.  After prepping and draping in the usual sterile fashion spinal needle was used to get local anesthetic on the right side down to the S1 pedicle as well as the L5 pedicle.  Small incisions were made and trocar advanced first into S1 with biopsy obtained there and then it was redirected to cross the disc space and get into the bottom of the L5 vertebral body with additional biopsy and cultures obtained of both of these levels as well as some of the disc space.  The biopsies did not appear to have gray or other suspicious material for malignancy and it appeared more consistent with prior thought of displaced infection and osteomyelitis.  After multiple biopsies were obtained at this level along the cultures a second trocar was advanced at L5 went across the dissipation to the bottom of L4 with biopsies and cultures obtained and  then redirected into L5 with additional biopsy and culture the trochars were then removed andt Dermabond applied to the incisions then covered with Band-Aids  PLAN OF CARE: Discharge to home after PACU  PATIENT DISPOSITION:  PACU - hemodynamically stable.

## 2019-02-08 NOTE — Anesthesia Post-op Follow-up Note (Signed)
Anesthesia QCDR form completed.        

## 2019-02-08 NOTE — H&P (Signed)
Reviewed paper H+P, will be scanned into chart. No changes noted.  
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
## 2019-02-08 NOTE — Transfer of Care (Signed)
Immediate Anesthesia Transfer of Care Note  Patient: Alicia Lynch  Procedure(s) Performed: LUMBAR SPINE BIOPSY, L4,L5,S1 - SLEEP APNEA (N/A Back)  Patient Location: PACU  Anesthesia Type:General  Level of Consciousness: awake, alert  and oriented  Airway & Oxygen Therapy: Patient Spontanous Breathing and Patient connected to nasal cannula oxygen  Post-op Assessment: Report given to RN and Post -op Vital signs reviewed and stable  Post vital signs: Reviewed and stable  Last Vitals:  Vitals Value Taken Time  BP 96/68 02/08/2019  8:40 AM  Temp 36.6 C 02/08/2019  8:39 AM  Pulse 115 02/08/2019  8:39 AM  Resp 31 02/08/2019  8:40 AM  SpO2 99 % 02/08/2019  8:39 AM  Vitals shown include unvalidated device data.  Last Pain:  Vitals:   02/08/19 0839  TempSrc:   PainSc: 0-No pain         Complications: No apparent anesthesia complications

## 2019-02-13 LAB — AEROBIC/ANAEROBIC CULTURE W GRAM STAIN (SURGICAL/DEEP WOUND)
Culture: NO GROWTH
Culture: NO GROWTH

## 2019-02-13 LAB — AEROBIC/ANAEROBIC CULTURE (SURGICAL/DEEP WOUND): Culture: NO GROWTH

## 2019-02-13 LAB — SURGICAL PATHOLOGY

## 2019-02-14 ENCOUNTER — Other Ambulatory Visit: Payer: Self-pay | Admitting: *Deleted

## 2019-02-14 DIAGNOSIS — R918 Other nonspecific abnormal finding of lung field: Secondary | ICD-10-CM

## 2019-02-14 DIAGNOSIS — R937 Abnormal findings on diagnostic imaging of other parts of musculoskeletal system: Secondary | ICD-10-CM

## 2019-02-14 DIAGNOSIS — D649 Anemia, unspecified: Secondary | ICD-10-CM

## 2019-02-22 ENCOUNTER — Telehealth: Payer: Self-pay | Admitting: Infectious Diseases

## 2019-02-22 NOTE — Telephone Encounter (Signed)
Called and left message to call us back so that we could schedule her an appt    Thanks

## 2019-03-06 ENCOUNTER — Ambulatory Visit: Payer: Medicaid Other | Admitting: Infectious Diseases

## 2019-03-08 ENCOUNTER — Encounter: Payer: Self-pay | Admitting: Infectious Diseases

## 2019-03-08 ENCOUNTER — Other Ambulatory Visit: Payer: Self-pay

## 2019-03-08 ENCOUNTER — Other Ambulatory Visit
Admission: RE | Admit: 2019-03-08 | Discharge: 2019-03-08 | Disposition: A | Discharge status: Home / Self Care | Payer: Medicaid Other | Source: Ambulatory Visit | Attending: Infectious Diseases | Admitting: Infectious Diseases

## 2019-03-08 ENCOUNTER — Ambulatory Visit: Payer: Medicaid Other | Attending: Infectious Diseases | Admitting: Infectious Diseases

## 2019-03-08 VITALS — BP 117/85 | HR 94 | Temp 97.2°F | Ht 64.0 in | Wt 163.6 lb

## 2019-03-08 DIAGNOSIS — D649 Anemia, unspecified: Secondary | ICD-10-CM

## 2019-03-08 DIAGNOSIS — R918 Other nonspecific abnormal finding of lung field: Secondary | ICD-10-CM

## 2019-03-08 DIAGNOSIS — Z79899 Other long term (current) drug therapy: Secondary | ICD-10-CM

## 2019-03-08 DIAGNOSIS — G8921 Chronic pain due to trauma: Secondary | ICD-10-CM | POA: Diagnosis not present

## 2019-03-08 DIAGNOSIS — R937 Abnormal findings on diagnostic imaging of other parts of musculoskeletal system: Secondary | ICD-10-CM

## 2019-03-08 DIAGNOSIS — E119 Type 2 diabetes mellitus without complications: Secondary | ICD-10-CM | POA: Diagnosis not present

## 2019-03-08 DIAGNOSIS — I10 Essential (primary) hypertension: Secondary | ICD-10-CM

## 2019-03-08 DIAGNOSIS — M869 Osteomyelitis, unspecified: Secondary | ICD-10-CM | POA: Diagnosis present

## 2019-03-08 DIAGNOSIS — M545 Low back pain: Secondary | ICD-10-CM | POA: Diagnosis not present

## 2019-03-08 DIAGNOSIS — Z885 Allergy status to narcotic agent status: Secondary | ICD-10-CM

## 2019-03-08 DIAGNOSIS — Z886 Allergy status to analgesic agent status: Secondary | ICD-10-CM

## 2019-03-08 DIAGNOSIS — J449 Chronic obstructive pulmonary disease, unspecified: Secondary | ICD-10-CM

## 2019-03-08 LAB — CBC WITH DIFFERENTIAL/PLATELET
Abs Immature Granulocytes: 0.02 10*3/uL (ref 0.00–0.07)
Basophils Absolute: 0 10*3/uL (ref 0.0–0.1)
Basophils Relative: 0 %
Eosinophils Absolute: 0.1 10*3/uL (ref 0.0–0.5)
Eosinophils Relative: 3 %
HCT: 31.8 % — ABNORMAL LOW (ref 36.0–46.0)
Hemoglobin: 10 g/dL — ABNORMAL LOW (ref 12.0–15.0)
Immature Granulocytes: 1 %
Lymphocytes Relative: 54 %
Lymphs Abs: 2.3 10*3/uL (ref 0.7–4.0)
MCH: 24.9 pg — ABNORMAL LOW (ref 26.0–34.0)
MCHC: 31.4 g/dL (ref 30.0–36.0)
MCV: 79.3 fL — ABNORMAL LOW (ref 80.0–100.0)
Monocytes Absolute: 0.3 10*3/uL (ref 0.1–1.0)
Monocytes Relative: 7 %
Neutro Abs: 1.4 10*3/uL — ABNORMAL LOW (ref 1.7–7.7)
Neutrophils Relative %: 35 %
Platelets: 248 10*3/uL (ref 150–400)
RBC: 4.01 MIL/uL (ref 3.87–5.11)
RDW: 17.5 % — ABNORMAL HIGH (ref 11.5–15.5)
WBC: 4.1 10*3/uL (ref 4.0–10.5)
nRBC: 0 % (ref 0.0–0.2)

## 2019-03-08 LAB — RETICULOCYTES
Immature Retic Fract: 11.1 % (ref 2.3–15.9)
RBC.: 4.01 MIL/uL (ref 3.87–5.11)
Retic Count, Absolute: 42.1 10*3/uL (ref 19.0–186.0)
Retic Ct Pct: 1.1 % (ref 0.4–3.1)

## 2019-03-08 LAB — C-REACTIVE PROTEIN: CRP: 0.9 mg/dL (ref <1.0)

## 2019-03-08 LAB — SEDIMENTATION RATE: Sed Rate: 98 mm/hr — ABNORMAL HIGH (ref 0–30)

## 2019-03-08 LAB — VITAMIN B12: Vitamin B-12: 309 pg/mL (ref 180–914)

## 2019-03-08 NOTE — Patient Instructions (Signed)
You are referred to me to r/o osteomyleitis of the back- looking at the MRI and biopsy and culture resul;ts it favors more degeneration of the disk and vertebre rather than osteomyelitis. Will do some labs today including ESR, CRP, quantiferon Gold and HIV and will also discuss with pathology and radiology.please monitor your temp andf record it every day. and will see you back in 3-4 weeks
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
## 2019-03-09 LAB — HAPTOGLOBIN: Haptoglobin: 384 mg/dL — ABNORMAL HIGH (ref 33–346)

## 2019-03-12 LAB — MULTIPLE MYELOMA PANEL, SERUM
Albumin SerPl Elph-Mcnc: 2.4 g/dL — ABNORMAL LOW (ref 2.9–4.4)
Albumin/Glob SerPl: 0.4 — ABNORMAL LOW (ref 0.7–1.7)
Alpha 1: 0.3 g/dL (ref 0.0–0.4)
Alpha2 Glob SerPl Elph-Mcnc: 1.1 g/dL — ABNORMAL HIGH (ref 0.4–1.0)
B-Globulin SerPl Elph-Mcnc: 1.2 g/dL (ref 0.7–1.3)
Gamma Glob SerPl Elph-Mcnc: 4.2 g/dL — ABNORMAL HIGH (ref 0.4–1.8)
Globulin, Total: 6.8 g/dL — ABNORMAL HIGH (ref 2.2–3.9)
IgA: 464 mg/dL — ABNORMAL HIGH (ref 87–352)
IgG (Immunoglobin G), Serum: 4375 mg/dL — ABNORMAL HIGH (ref 586–1602)
IgM (Immunoglobulin M), Srm: 1068 mg/dL — ABNORMAL HIGH (ref 26–217)
Total Protein ELP: 9.2 g/dL — ABNORMAL HIGH (ref 6.0–8.5)

## 2019-03-13 ENCOUNTER — Other Ambulatory Visit: Payer: Self-pay

## 2019-03-13 ENCOUNTER — Other Ambulatory Visit
Admission: RE | Admit: 2019-03-13 | Discharge: 2019-03-13 | Disposition: A | Discharge status: Home / Self Care | Payer: Medicaid Other | Attending: Oncology | Admitting: Oncology

## 2019-03-13 DIAGNOSIS — R937 Abnormal findings on diagnostic imaging of other parts of musculoskeletal system: Secondary | ICD-10-CM | POA: Diagnosis not present

## 2019-03-13 DIAGNOSIS — R918 Other nonspecific abnormal finding of lung field: Secondary | ICD-10-CM | POA: Insufficient documentation

## 2019-03-13 DIAGNOSIS — D649 Anemia, unspecified: Secondary | ICD-10-CM | POA: Diagnosis not present

## 2019-03-13 DIAGNOSIS — M869 Osteomyelitis, unspecified: Secondary | ICD-10-CM | POA: Diagnosis not present

## 2019-03-13 LAB — QUANTIFERON-TB GOLD PLUS (RQFGPL)
QuantiFERON Mitogen Value: 2.13 IU/mL
QuantiFERON Nil Value: 0.16 IU/mL
QuantiFERON TB1 Ag Value: 0.31 IU/mL
QuantiFERON TB2 Ag Value: 0.29 IU/mL

## 2019-03-13 LAB — IRON AND TIBC
Iron: 67 ug/dL (ref 28–170)
Saturation Ratios: 46 % — ABNORMAL HIGH (ref 10.4–31.8)
TIBC: 147 ug/dL — ABNORMAL LOW (ref 250–450)
UIBC: 80 ug/dL

## 2019-03-13 LAB — QUANTIFERON-TB GOLD PLUS: QuantiFERON-TB Gold Plus: NEGATIVE

## 2019-03-13 LAB — FOLATE: Folate: 17.8 ng/mL (ref >5.9)

## 2019-03-13 LAB — TSH: TSH: 0.824 u[IU]/mL (ref 0.350–4.500)

## 2019-03-13 LAB — FERRITIN: Ferritin: 92 ng/mL (ref 11–307)

## 2019-03-14 ENCOUNTER — Telehealth: Payer: Self-pay | Admitting: Infectious Diseases

## 2019-03-14 LAB — KAPPA/LAMBDA LIGHT CHAINS
Kappa free light chain: 296.5 mg/L — ABNORMAL HIGH (ref 3.3–19.4)
Kappa, lambda light chain ratio: 1.12 (ref 0.26–1.65)
Lambda free light chains: 264.4 mg/L — ABNORMAL HIGH (ref 5.7–26.3)

## 2019-03-14 LAB — HIV 1/2 AB DIFFERENTIATION
HIV 1 Ab: POSITIVE — AB
HIV 2 Ab: NEGATIVE

## 2019-03-14 LAB — HIV ANTIBODY (ROUTINE TESTING W REFLEX): HIV Screen 4th Generation wRfx: REACTIVE — AB

## 2019-03-14 NOTE — Telephone Encounter (Signed)
pts recent labs I sent came back positive for HIV this afternoon- Called patient to ask her to come back to see me tomorrow for some labs- Did not discuss the positive result over the phone . Will talk to her in person tomorrow

## 2019-03-15 ENCOUNTER — Inpatient Hospital Stay: Payer: Medicaid Other | Attending: Oncology

## 2019-03-15 ENCOUNTER — Other Ambulatory Visit: Payer: Self-pay

## 2019-03-15 ENCOUNTER — Encounter: Payer: Self-pay | Admitting: Oncology

## 2019-03-15 ENCOUNTER — Inpatient Hospital Stay (HOSPITAL_BASED_OUTPATIENT_CLINIC_OR_DEPARTMENT_OTHER): Payer: Medicaid Other | Admitting: Oncology

## 2019-03-15 ENCOUNTER — Other Ambulatory Visit: Payer: Self-pay | Admitting: Infectious Diseases

## 2019-03-15 ENCOUNTER — Other Ambulatory Visit
Admission: RE | Admit: 2019-03-15 | Discharge: 2019-03-15 | Disposition: A | Discharge status: Home / Self Care | Payer: Medicaid Other | Source: Ambulatory Visit | Attending: Infectious Diseases | Admitting: Infectious Diseases

## 2019-03-15 VITALS — BP 119/82 | HR 97 | Temp 97.0°F | Resp 20 | Ht 64.0 in | Wt 162.4 lb

## 2019-03-15 DIAGNOSIS — D709 Neutropenia, unspecified: Secondary | ICD-10-CM

## 2019-03-15 DIAGNOSIS — D649 Anemia, unspecified: Secondary | ICD-10-CM

## 2019-03-15 DIAGNOSIS — Z79899 Other long term (current) drug therapy: Secondary | ICD-10-CM | POA: Diagnosis not present

## 2019-03-15 DIAGNOSIS — I10 Essential (primary) hypertension: Secondary | ICD-10-CM | POA: Insufficient documentation

## 2019-03-15 DIAGNOSIS — R937 Abnormal findings on diagnostic imaging of other parts of musculoskeletal system: Secondary | ICD-10-CM | POA: Insufficient documentation

## 2019-03-15 DIAGNOSIS — Z21 Asymptomatic human immunodeficiency virus [HIV] infection status: Secondary | ICD-10-CM | POA: Insufficient documentation

## 2019-03-15 DIAGNOSIS — D890 Polyclonal hypergammaglobulinemia: Secondary | ICD-10-CM | POA: Insufficient documentation

## 2019-03-15 NOTE — Progress Notes (Deleted)
Myelo arch

## 2019-03-15 NOTE — Progress Notes (Deleted)
B arch Retic arch

## 2019-03-15 NOTE — Progress Notes (Deleted)
Arch TSH arch

## 2019-03-15 NOTE — Progress Notes (Signed)
HIV test from last week came back positive- I gave her the result and proceeded to do HV RNA, CD4 and repeat antibody to confirm it-As far as she knows her husband is not positive. Will see her on Tuesday once the results are back

## 2019-03-15 NOTE — Addendum Note (Signed)
Addended by: Santiago Bur on: 03/15/2019 09:39 AM   Modules accepted: Orders

## 2019-03-15 NOTE — Addendum Note (Signed)
Addended by: Santiago Bur on: 03/15/2019 09:38 AM   Modules accepted: Orders
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
## 2019-03-16 LAB — T-HELPER CELLS CD4/CD8 %
% CD 4 Pos. Lymph.: 8.7 % — ABNORMAL LOW (ref 30.8–58.5)
Absolute CD 4 Helper: 165 /uL — ABNORMAL LOW (ref 359–1519)
Basophils Absolute: 0 10*3/uL (ref 0.0–0.2)
Basos: 0 %
CD3+CD4+ Cells/CD3+CD8+ Cells Bld: 0.13 — ABNORMAL LOW (ref 0.92–3.72)
CD3+CD8+ Cells # Bld: 1284 /uL — ABNORMAL HIGH (ref 109–897)
CD3+CD8+ Cells NFr Bld: 67.6 % — ABNORMAL HIGH (ref 12.0–35.5)
EOS (ABSOLUTE): 0.1 10*3/uL (ref 0.0–0.4)
Eos: 3 %
Hematocrit: 27.4 % — ABNORMAL LOW (ref 34.0–46.6)
Hemoglobin: 9.2 g/dL — ABNORMAL LOW (ref 11.1–15.9)
Immature Grans (Abs): 0 10*3/uL (ref 0.0–0.1)
Immature Granulocytes: 1 %
Lymphocytes Absolute: 1.9 10*3/uL (ref 0.7–3.1)
Lymphs: 47 %
MCH: 25 pg — ABNORMAL LOW (ref 26.6–33.0)
MCHC: 33.6 g/dL (ref 31.5–35.7)
MCV: 75 fL — ABNORMAL LOW (ref 79–97)
Monocytes Absolute: 0.3 10*3/uL (ref 0.1–0.9)
Monocytes: 8 %
Neutrophils Absolute: 1.6 10*3/uL (ref 1.4–7.0)
Neutrophils: 41 %
Platelets: 319 10*3/uL (ref 150–450)
RBC: 3.68 x10E6/uL — ABNORMAL LOW (ref 3.77–5.28)
RDW: 16.4 % — ABNORMAL HIGH (ref 11.7–15.4)
WBC: 4 10*3/uL (ref 3.4–10.8)

## 2019-03-16 LAB — HIV 1/2 AB DIFFERENTIATION
HIV 1 Ab: POSITIVE — AB
HIV 2 Ab: NEGATIVE

## 2019-03-16 LAB — HIV-1 RNA QUANT-NO REFLEX-BLD
HIV 1 RNA Quant: 223000 copies/mL
LOG10 HIV-1 RNA: 5.348 log10copy/mL

## 2019-03-16 LAB — HIV ANTIBODY (ROUTINE TESTING W REFLEX): HIV Screen 4th Generation wRfx: REACTIVE — AB

## 2019-03-20 ENCOUNTER — Ambulatory Visit: Payer: Medicaid Other | Admitting: Infectious Diseases

## 2019-03-22 ENCOUNTER — Ambulatory Visit: Payer: Medicaid Other | Admitting: Infectious Diseases

## 2019-03-24 ENCOUNTER — Ambulatory Visit: Payer: Medicaid Other

## 2019-03-24 ENCOUNTER — Other Ambulatory Visit: Payer: Self-pay

## 2019-03-24 ENCOUNTER — Ambulatory Visit
Admission: EM | Admit: 2019-03-24 | Discharge: 2019-03-24 | Discharge status: Against Medical Advice | Payer: Medicaid Other | Attending: Physician Assistant | Admitting: Physician Assistant

## 2019-03-24 DIAGNOSIS — R936 Abnormal findings on diagnostic imaging of limbs: Secondary | ICD-10-CM | POA: Diagnosis present

## 2019-03-24 DIAGNOSIS — Z8739 Personal history of other diseases of the musculoskeletal system and connective tissue: Secondary | ICD-10-CM | POA: Insufficient documentation

## 2019-03-24 DIAGNOSIS — Z21 Asymptomatic human immunodeficiency virus [HIV] infection status: Secondary | ICD-10-CM | POA: Diagnosis present

## 2019-03-24 DIAGNOSIS — M25512 Pain in left shoulder: Secondary | ICD-10-CM | POA: Diagnosis present
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
## 2019-03-24 NOTE — ED Triage Notes (Signed)
Patient complains of left shoulder pain that occurred after moving her TV on Wednesday. Patient states area has been swollen and throbbing.

## 2019-03-24 NOTE — Discharge Instructions (Addendum)
LEFT SHOULDER PAIN: The imaging obtained today concerning for another infection in your bones. The radiologist also had concerns about lytic lesions (cancers) and possible avulsion fracture. This is very serious and I have advised you to go to the ER right now for admission which would include further examination, further imaging workup, IV antibiotics and consult with surgery and infectious disease. You state that you cannot go today but will go tomorrow. I have explained the risks of this including further spread of infection and in some cases you may become very ill or die. You have signed an [place] form stating you understand this. If pain worsens or you develop a fever >100 degrees, call EMS and get to ER immediately! Otherwise go to ER first thing in the morning.   Right now, it is advised to wear shoulder sling. Ice the shoulder. Keep slightly elevated when possible. Apply lidocaine patches to area of pain. Take Tylenol for pain. I do not want to give you anything stronger that may mask the pain. Avoid use of shoulder while in significant pain.   PLEASE PLEASE PLEASE GO TO ER AS SOON AS YOU ARE ABLE TO. THIS SITUATION IS VERY SERIOUS AND YOU NEED FURTHER ASSESSMENT AND TREATMENT AS SOON AS POSSIBLE.

## 2019-03-27 ENCOUNTER — Ambulatory Visit: Payer: Medicaid Other | Attending: Infectious Diseases | Admitting: Infectious Diseases

## 2019-03-27 ENCOUNTER — Other Ambulatory Visit: Payer: Self-pay

## 2019-03-27 ENCOUNTER — Encounter: Payer: Self-pay | Admitting: Infectious Diseases

## 2019-03-27 ENCOUNTER — Other Ambulatory Visit
Admission: RE | Admit: 2019-03-27 | Discharge: 2019-03-27 | Disposition: A | Discharge status: Home / Self Care | Payer: Medicaid Other | Source: Ambulatory Visit | Attending: Infectious Diseases | Admitting: Infectious Diseases

## 2019-03-27 VITALS — BP 130/89 | HR 93 | Temp 98.2°F | Ht 64.0 in | Wt 160.0 lb

## 2019-03-27 DIAGNOSIS — I10 Essential (primary) hypertension: Secondary | ICD-10-CM

## 2019-03-27 DIAGNOSIS — M545 Low back pain: Secondary | ICD-10-CM | POA: Diagnosis not present

## 2019-03-27 DIAGNOSIS — G8921 Chronic pain due to trauma: Secondary | ICD-10-CM | POA: Diagnosis not present

## 2019-03-27 DIAGNOSIS — Z7951 Long term (current) use of inhaled steroids: Secondary | ICD-10-CM

## 2019-03-27 DIAGNOSIS — B20 Human immunodeficiency virus [HIV] disease: Secondary | ICD-10-CM | POA: Insufficient documentation

## 2019-03-27 DIAGNOSIS — Z886 Allergy status to analgesic agent status: Secondary | ICD-10-CM

## 2019-03-27 DIAGNOSIS — J45909 Unspecified asthma, uncomplicated: Secondary | ICD-10-CM

## 2019-03-27 DIAGNOSIS — F419 Anxiety disorder, unspecified: Secondary | ICD-10-CM

## 2019-03-27 DIAGNOSIS — Z79899 Other long term (current) drug therapy: Secondary | ICD-10-CM

## 2019-03-27 DIAGNOSIS — F329 Major depressive disorder, single episode, unspecified: Secondary | ICD-10-CM

## 2019-03-27 DIAGNOSIS — Z885 Allergy status to narcotic agent status: Secondary | ICD-10-CM

## 2019-03-27 DIAGNOSIS — M25512 Pain in left shoulder: Secondary | ICD-10-CM

## 2019-03-27 DIAGNOSIS — R7303 Prediabetes: Secondary | ICD-10-CM

## 2019-03-27 DIAGNOSIS — Z9889 Other specified postprocedural states: Secondary | ICD-10-CM

## 2019-03-27 LAB — COMPREHENSIVE METABOLIC PANEL
ALT: 9 U/L (ref 0–44)
AST: 21 U/L (ref 15–41)
Albumin: 2.2 g/dL — ABNORMAL LOW (ref 3.5–5.0)
Alkaline Phosphatase: 78 U/L (ref 38–126)
Anion gap: 6 (ref 5–15)
BUN: 5 mg/dL — ABNORMAL LOW (ref 6–20)
CO2: 22 mmol/L (ref 22–32)
Calcium: 8.5 mg/dL — ABNORMAL LOW (ref 8.9–10.3)
Chloride: 109 mmol/L (ref 98–111)
Creatinine, Ser: 0.68 mg/dL (ref 0.44–1.00)
GFR calc Af Amer: >60 mL/min (ref >60)
GFR calc non Af Amer: >60 mL/min (ref >60)
Glucose, Bld: 92 mg/dL (ref 70–99)
Potassium: 3.3 mmol/L — ABNORMAL LOW (ref 3.5–5.1)
Sodium: 137 mmol/L (ref 135–145)
Total Bilirubin: 0.5 mg/dL (ref 0.3–1.2)
Total Protein: 9.1 g/dL — ABNORMAL HIGH (ref 6.5–8.1)

## 2019-03-27 LAB — LIPID PANEL
Cholesterol: 114 mg/dL (ref 0–200)
HDL: 54 mg/dL (ref >40)
LDL Cholesterol: 47 mg/dL (ref 0–99)
Total CHOL/HDL Ratio: 2.1 RATIO
Triglycerides: 64 mg/dL (ref <150)
VLDL: 13 mg/dL (ref 0–40)

## 2019-03-27 MED ORDER — BIKTARVY 50-200-25 MG PO TABS: 1.0000 | ORAL_TABLET | Freq: Every day | ORAL | 1 refills | Status: DC

## 2019-03-27 NOTE — Patient Instructions (Addendum)
You are here to engagae in care for the HIV virus. Your viral load is high at 224,000 and cd4 count which is your immune system is low at 169. We need to start you on a pill called BIKTARVy which you will take one a day. We will check your blood work again in 4 weeks. You may experience  some l symptoms like nausea or abdominal discomfort  in the first few days, but usually will go away .please take the pill every day at the same time-
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
## 2019-03-28 LAB — TOXOPLASMA ANTIBODIES- IGG AND  IGM
Toxoplasma Antibody- IgM: <3 AU/mL (ref 0.0–7.9)
Toxoplasma IgG Ratio: <3 IU/mL (ref 0.0–7.1)

## 2019-03-28 LAB — HEPATITIS PANEL, ACUTE
HCV Ab: <0.1 s/co ratio (ref 0.0–0.9)
Hep A IgM: NEGATIVE
Hep B C IgM: NEGATIVE
Hepatitis B Surface Ag: NEGATIVE

## 2019-03-28 LAB — MISC LABCORP TEST (SEND OUT)
Labcorp test code: 6494
Labcorp test code: 96727

## 2019-03-28 LAB — RPR: RPR Ser Ql: NONREACTIVE

## 2019-03-28 LAB — HEPATITIS B SURFACE ANTIBODY, QUANTITATIVE: Hep B S AB Quant (Post): 75.3 m[IU]/mL (ref >9.9)

## 2019-03-29 ENCOUNTER — Ambulatory Visit: Payer: Medicaid Other | Admitting: Infectious Diseases

## 2019-04-02 LAB — MISC LABCORP TEST (SEND OUT): Labcorp test code: 6926

## 2019-04-04 ENCOUNTER — Other Ambulatory Visit: Payer: Self-pay | Admitting: Orthopedic Surgery

## 2019-04-04 DIAGNOSIS — M25512 Pain in left shoulder: Secondary | ICD-10-CM

## 2019-04-04 DIAGNOSIS — S46002A Unspecified injury of muscle(s) and tendon(s) of the rotator cuff of left shoulder, initial encounter: Secondary | ICD-10-CM

## 2019-04-04 DIAGNOSIS — G8911 Acute pain due to trauma: Secondary | ICD-10-CM

## 2019-04-04 DIAGNOSIS — M25312 Other instability, left shoulder: Secondary | ICD-10-CM

## 2019-04-05 ENCOUNTER — Ambulatory Visit: Payer: Medicaid Other | Admitting: Infectious Diseases

## 2019-04-18 ENCOUNTER — Other Ambulatory Visit: Payer: Self-pay

## 2019-04-18 ENCOUNTER — Encounter (HOSPITAL_COMMUNITY): Payer: Self-pay

## 2019-04-18 ENCOUNTER — Ambulatory Visit
Admission: RE | Admit: 2019-04-18 | Discharge: 2019-04-18 | Disposition: A | Discharge status: Home / Self Care | Payer: Medicaid Other | Source: Ambulatory Visit | Attending: Orthopedic Surgery | Admitting: Orthopedic Surgery

## 2019-04-18 ENCOUNTER — Emergency Department (HOSPITAL_COMMUNITY): Payer: Medicaid Other

## 2019-04-18 ENCOUNTER — Inpatient Hospital Stay (HOSPITAL_COMMUNITY)
Admission: EM | Admit: 2019-04-18 | Discharge: 2019-04-23 | DRG: 970 | Disposition: A | Discharge status: Home Health | Payer: Medicaid Other | Attending: Student | Admitting: Student

## 2019-04-18 DIAGNOSIS — G4730 Sleep apnea, unspecified: Secondary | ICD-10-CM | POA: Diagnosis present

## 2019-04-18 DIAGNOSIS — M868X9 Other osteomyelitis, unspecified sites: Secondary | ICD-10-CM | POA: Diagnosis not present

## 2019-04-18 DIAGNOSIS — E876 Hypokalemia: Secondary | ICD-10-CM | POA: Diagnosis present

## 2019-04-18 DIAGNOSIS — Z8249 Family history of ischemic heart disease and other diseases of the circulatory system: Secondary | ICD-10-CM | POA: Diagnosis not present

## 2019-04-18 DIAGNOSIS — Z452 Encounter for adjustment and management of vascular access device: Secondary | ICD-10-CM

## 2019-04-18 DIAGNOSIS — Z21 Asymptomatic human immunodeficiency virus [HIV] infection status: Secondary | ICD-10-CM | POA: Diagnosis not present

## 2019-04-18 DIAGNOSIS — M86122 Other acute osteomyelitis, left humerus: Secondary | ICD-10-CM | POA: Diagnosis present

## 2019-04-18 DIAGNOSIS — J449 Chronic obstructive pulmonary disease, unspecified: Secondary | ICD-10-CM | POA: Diagnosis present

## 2019-04-18 DIAGNOSIS — G43909 Migraine, unspecified, not intractable, without status migrainosus: Secondary | ICD-10-CM | POA: Diagnosis present

## 2019-04-18 DIAGNOSIS — D638 Anemia in other chronic diseases classified elsewhere: Secondary | ICD-10-CM | POA: Diagnosis present

## 2019-04-18 DIAGNOSIS — Z9889 Other specified postprocedural states: Secondary | ICD-10-CM

## 2019-04-18 DIAGNOSIS — G894 Chronic pain syndrome: Secondary | ICD-10-CM | POA: Diagnosis present

## 2019-04-18 DIAGNOSIS — Z823 Family history of stroke: Secondary | ICD-10-CM

## 2019-04-18 DIAGNOSIS — Z886 Allergy status to analgesic agent status: Secondary | ICD-10-CM | POA: Diagnosis not present

## 2019-04-18 DIAGNOSIS — R110 Nausea: Secondary | ICD-10-CM | POA: Diagnosis not present

## 2019-04-18 DIAGNOSIS — Z9981 Dependence on supplemental oxygen: Secondary | ICD-10-CM | POA: Diagnosis not present

## 2019-04-18 DIAGNOSIS — L02414 Cutaneous abscess of left upper limb: Secondary | ICD-10-CM | POA: Diagnosis present

## 2019-04-18 DIAGNOSIS — M19012 Primary osteoarthritis, left shoulder: Secondary | ICD-10-CM | POA: Diagnosis present

## 2019-04-18 DIAGNOSIS — Z6827 Body mass index (BMI) 27.0-27.9, adult: Secondary | ICD-10-CM | POA: Diagnosis not present

## 2019-04-18 DIAGNOSIS — Z8349 Family history of other endocrine, nutritional and metabolic diseases: Secondary | ICD-10-CM

## 2019-04-18 DIAGNOSIS — D62 Acute posthemorrhagic anemia: Secondary | ICD-10-CM | POA: Diagnosis not present

## 2019-04-18 DIAGNOSIS — M1990 Unspecified osteoarthritis, unspecified site: Secondary | ICD-10-CM | POA: Diagnosis not present

## 2019-04-18 DIAGNOSIS — K219 Gastro-esophageal reflux disease without esophagitis: Secondary | ICD-10-CM | POA: Diagnosis present

## 2019-04-18 DIAGNOSIS — Z79899 Other long term (current) drug therapy: Secondary | ICD-10-CM | POA: Diagnosis not present

## 2019-04-18 DIAGNOSIS — M25512 Pain in left shoulder: Secondary | ICD-10-CM | POA: Insufficient documentation

## 2019-04-18 DIAGNOSIS — G8911 Acute pain due to trauma: Secondary | ICD-10-CM | POA: Diagnosis present

## 2019-04-18 DIAGNOSIS — E1169 Type 2 diabetes mellitus with other specified complication: Secondary | ICD-10-CM | POA: Diagnosis present

## 2019-04-18 DIAGNOSIS — M009 Pyogenic arthritis, unspecified: Secondary | ICD-10-CM | POA: Diagnosis present

## 2019-04-18 DIAGNOSIS — E119 Type 2 diabetes mellitus without complications: Secondary | ICD-10-CM | POA: Diagnosis not present

## 2019-04-18 DIAGNOSIS — Z803 Family history of malignant neoplasm of breast: Secondary | ICD-10-CM | POA: Diagnosis not present

## 2019-04-18 DIAGNOSIS — B20 Human immunodeficiency virus [HIV] disease: Secondary | ICD-10-CM | POA: Diagnosis present

## 2019-04-18 DIAGNOSIS — M25312 Other instability, left shoulder: Secondary | ICD-10-CM | POA: Diagnosis present

## 2019-04-18 DIAGNOSIS — Z833 Family history of diabetes mellitus: Secondary | ICD-10-CM

## 2019-04-18 DIAGNOSIS — Z888 Allergy status to other drugs, medicaments and biological substances status: Secondary | ICD-10-CM

## 2019-04-18 DIAGNOSIS — Z885 Allergy status to narcotic agent status: Secondary | ICD-10-CM | POA: Diagnosis not present

## 2019-04-18 DIAGNOSIS — S46002A Unspecified injury of muscle(s) and tendon(s) of the rotator cuff of left shoulder, initial encounter: Secondary | ICD-10-CM | POA: Diagnosis present

## 2019-04-18 DIAGNOSIS — M869 Osteomyelitis, unspecified: Secondary | ICD-10-CM | POA: Diagnosis not present

## 2019-04-18 DIAGNOSIS — I10 Essential (primary) hypertension: Secondary | ICD-10-CM | POA: Diagnosis present

## 2019-04-18 DIAGNOSIS — Z1159 Encounter for screening for other viral diseases: Secondary | ICD-10-CM

## 2019-04-18 DIAGNOSIS — Z821 Family history of blindness and visual loss: Secondary | ICD-10-CM

## 2019-04-18 DIAGNOSIS — Z800 Family history of malignant neoplasm of digestive organs: Secondary | ICD-10-CM

## 2019-04-18 LAB — URINALYSIS, ROUTINE W REFLEX MICROSCOPIC
Bilirubin Urine: NEGATIVE
Glucose, UA: NEGATIVE mg/dL
Ketones, ur: NEGATIVE mg/dL
Nitrite: NEGATIVE
Protein, ur: NEGATIVE mg/dL
Specific Gravity, Urine: 1.017 (ref 1.005–1.030)
pH: 5 (ref 5.0–8.0)

## 2019-04-18 LAB — COMPREHENSIVE METABOLIC PANEL
ALT: 11 U/L (ref 0–44)
AST: 18 U/L (ref 15–41)
Albumin: 2.2 g/dL — ABNORMAL LOW (ref 3.5–5.0)
Alkaline Phosphatase: 74 U/L (ref 38–126)
Anion gap: 8 (ref 5–15)
BUN: 6 mg/dL (ref 6–20)
CO2: 22 mmol/L (ref 22–32)
Calcium: 9 mg/dL (ref 8.9–10.3)
Chloride: 109 mmol/L (ref 98–111)
Creatinine, Ser: 0.89 mg/dL (ref 0.44–1.00)
GFR calc Af Amer: >60 mL/min (ref >60)
GFR calc non Af Amer: >60 mL/min (ref >60)
Glucose, Bld: 87 mg/dL (ref 70–99)
Potassium: 3.1 mmol/L — ABNORMAL LOW (ref 3.5–5.1)
Sodium: 139 mmol/L (ref 135–145)
Total Bilirubin: 0.7 mg/dL (ref 0.3–1.2)
Total Protein: 9.3 g/dL — ABNORMAL HIGH (ref 6.5–8.1)

## 2019-04-18 LAB — CBC WITH DIFFERENTIAL/PLATELET
Abs Immature Granulocytes: 0.02 10*3/uL (ref 0.00–0.07)
Basophils Absolute: 0 10*3/uL (ref 0.0–0.1)
Basophils Relative: 1 %
Eosinophils Absolute: 0.1 10*3/uL (ref 0.0–0.5)
Eosinophils Relative: 2 %
HCT: 30.9 % — ABNORMAL LOW (ref 36.0–46.0)
Hemoglobin: 9.7 g/dL — ABNORMAL LOW (ref 12.0–15.0)
Immature Granulocytes: 0 %
Lymphocytes Relative: 55 %
Lymphs Abs: 3 10*3/uL (ref 0.7–4.0)
MCH: 25.5 pg — ABNORMAL LOW (ref 26.0–34.0)
MCHC: 31.4 g/dL (ref 30.0–36.0)
MCV: 81.3 fL (ref 80.0–100.0)
Monocytes Absolute: 0.6 10*3/uL (ref 0.1–1.0)
Monocytes Relative: 10 %
Neutro Abs: 1.8 10*3/uL (ref 1.7–7.7)
Neutrophils Relative %: 32 %
Platelets: 413 10*3/uL — ABNORMAL HIGH (ref 150–400)
RBC: 3.8 MIL/uL — ABNORMAL LOW (ref 3.87–5.11)
RDW: 19 % — ABNORMAL HIGH (ref 11.5–15.5)
WBC: 5.6 10*3/uL (ref 4.0–10.5)
nRBC: 0 % (ref 0.0–0.2)

## 2019-04-18 LAB — LACTIC ACID, PLASMA: Lactic Acid, Venous: 0.5 mmol/L (ref 0.5–1.9)

## 2019-04-18 MED ORDER — LIDOCAINE-EPINEPHRINE (PF) 2 %-1:200000 IJ SOLN
10.0000 mL | Freq: Once | INTRAMUSCULAR | Status: AC
  Administered 2019-04-18: 10 mL
  Filled 2019-04-18: qty 20

## 2019-04-18 MED ORDER — SODIUM CHLORIDE 0.9% FLUSH: 3.0000 mL | Freq: Once | INTRAVENOUS | Status: DC

## 2019-04-18 MED ORDER — MORPHINE SULFATE (PF) 4 MG/ML IV SOLN
4.0000 mg | INTRAVENOUS | Status: DC | PRN
  Administered 2019-04-18: 4 mg via INTRAVENOUS
  Filled 2019-04-18: qty 1

## 2019-04-18 NOTE — ED Triage Notes (Signed)
Pt sent to ED after MRI left shoulder shows septic arthritis.
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
## 2019-04-19 ENCOUNTER — Encounter (HOSPITAL_COMMUNITY): Payer: Self-pay | Admitting: *Deleted

## 2019-04-19 DIAGNOSIS — M868X9 Other osteomyelitis, unspecified sites: Secondary | ICD-10-CM

## 2019-04-19 DIAGNOSIS — Z886 Allergy status to analgesic agent status: Secondary | ICD-10-CM

## 2019-04-19 DIAGNOSIS — Z79899 Other long term (current) drug therapy: Secondary | ICD-10-CM

## 2019-04-19 DIAGNOSIS — Z888 Allergy status to other drugs, medicaments and biological substances status: Secondary | ICD-10-CM

## 2019-04-19 DIAGNOSIS — M009 Pyogenic arthritis, unspecified: Secondary | ICD-10-CM | POA: Diagnosis present

## 2019-04-19 DIAGNOSIS — I10 Essential (primary) hypertension: Secondary | ICD-10-CM

## 2019-04-19 DIAGNOSIS — J449 Chronic obstructive pulmonary disease, unspecified: Secondary | ICD-10-CM

## 2019-04-19 DIAGNOSIS — M1990 Unspecified osteoarthritis, unspecified site: Secondary | ICD-10-CM

## 2019-04-19 DIAGNOSIS — E1169 Type 2 diabetes mellitus with other specified complication: Secondary | ICD-10-CM

## 2019-04-19 DIAGNOSIS — Z21 Asymptomatic human immunodeficiency virus [HIV] infection status: Secondary | ICD-10-CM

## 2019-04-19 DIAGNOSIS — Z885 Allergy status to narcotic agent status: Secondary | ICD-10-CM

## 2019-04-19 LAB — RENAL FUNCTION PANEL
Albumin: 1.8 g/dL — ABNORMAL LOW (ref 3.5–5.0)
Anion gap: 7 (ref 5–15)
BUN: <5 mg/dL — ABNORMAL LOW (ref 6–20)
CO2: 22 mmol/L (ref 22–32)
Calcium: 8.5 mg/dL — ABNORMAL LOW (ref 8.9–10.3)
Chloride: 107 mmol/L (ref 98–111)
Creatinine, Ser: 0.78 mg/dL (ref 0.44–1.00)
GFR calc Af Amer: >60 mL/min (ref >60)
GFR calc non Af Amer: >60 mL/min (ref >60)
Glucose, Bld: 105 mg/dL — ABNORMAL HIGH (ref 70–99)
Phosphorus: 3 mg/dL (ref 2.5–4.6)
Potassium: 3.3 mmol/L — ABNORMAL LOW (ref 3.5–5.1)
Sodium: 136 mmol/L (ref 135–145)

## 2019-04-19 LAB — COMPREHENSIVE METABOLIC PANEL
ALT: 11 U/L (ref 0–44)
AST: 18 U/L (ref 15–41)
Albumin: 2.2 g/dL — ABNORMAL LOW (ref 3.5–5.0)
Alkaline Phosphatase: 76 U/L (ref 38–126)
Anion gap: 9 (ref 5–15)
BUN: 5 mg/dL — ABNORMAL LOW (ref 6–20)
CO2: 23 mmol/L (ref 22–32)
Calcium: 9.2 mg/dL (ref 8.9–10.3)
Chloride: 108 mmol/L (ref 98–111)
Creatinine, Ser: 0.86 mg/dL (ref 0.44–1.00)
GFR calc Af Amer: >60 mL/min (ref >60)
GFR calc non Af Amer: >60 mL/min (ref >60)
Glucose, Bld: 89 mg/dL (ref 70–99)
Potassium: 2.8 mmol/L — ABNORMAL LOW (ref 3.5–5.1)
Sodium: 140 mmol/L (ref 135–145)
Total Bilirubin: 0.6 mg/dL (ref 0.3–1.2)
Total Protein: 9.4 g/dL — ABNORMAL HIGH (ref 6.5–8.1)

## 2019-04-19 LAB — CBC
HCT: 32.6 % — ABNORMAL LOW (ref 36.0–46.0)
Hemoglobin: 10.3 g/dL — ABNORMAL LOW (ref 12.0–15.0)
MCH: 25.9 pg — ABNORMAL LOW (ref 26.0–34.0)
MCHC: 31.6 g/dL (ref 30.0–36.0)
MCV: 81.9 fL (ref 80.0–100.0)
Platelets: 458 10*3/uL — ABNORMAL HIGH (ref 150–400)
RBC: 3.98 MIL/uL (ref 3.87–5.11)
RDW: 19 % — ABNORMAL HIGH (ref 11.5–15.5)
WBC: 4.3 10*3/uL (ref 4.0–10.5)
nRBC: 0 % (ref 0.0–0.2)

## 2019-04-19 LAB — SURGICAL PCR SCREEN
MRSA, PCR: NEGATIVE
Staphylococcus aureus: POSITIVE — AB

## 2019-04-19 LAB — MAGNESIUM: Magnesium: 1.5 mg/dL — ABNORMAL LOW (ref 1.7–2.4)

## 2019-04-19 LAB — LACTIC ACID, PLASMA: Lactic Acid, Venous: 1.1 mmol/L (ref 0.5–1.9)

## 2019-04-19 LAB — SARS CORONAVIRUS 2 BY RT PCR (HOSPITAL ORDER, PERFORMED IN ~~LOC~~ HOSPITAL LAB): SARS Coronavirus 2: NEGATIVE

## 2019-04-19 MED ORDER — CYCLOBENZAPRINE HCL 10 MG PO TABS
10.0000 mg | ORAL_TABLET | Freq: Three times a day (TID) | ORAL | Status: DC | PRN
  Administered 2019-04-19 – 2019-04-22 (×5): 10 mg via ORAL
  Filled 2019-04-19 (×5): qty 1

## 2019-04-19 MED ORDER — MAGNESIUM SULFATE 2 GM/50ML IV SOLN
2.0000 g | Freq: Once | INTRAVENOUS | Status: AC
  Administered 2019-04-19: 2 g via INTRAVENOUS
  Filled 2019-04-19: qty 50

## 2019-04-19 MED ORDER — ALBUTEROL SULFATE (2.5 MG/3ML) 0.083% IN NEBU: 2.5000 mg | INHALATION_SOLUTION | Freq: Four times a day (QID) | RESPIRATORY_TRACT | Status: DC | PRN

## 2019-04-19 MED ORDER — POVIDONE-IODINE 10 % EX SWAB: 2.0000 "application " | Freq: Once | CUTANEOUS | Status: DC

## 2019-04-19 MED ORDER — MORPHINE SULFATE (PF) 2 MG/ML IV SOLN
2.0000 mg | INTRAVENOUS | Status: DC | PRN
  Administered 2019-04-19 – 2019-04-22 (×13): 2 mg via INTRAVENOUS
  Filled 2019-04-19 (×14): qty 1

## 2019-04-19 MED ORDER — BUPROPION HCL ER (XL) 150 MG PO TB24
300.0000 mg | ORAL_TABLET | Freq: Every day | ORAL | Status: DC
  Administered 2019-04-19 – 2019-04-23 (×4): 300 mg via ORAL
  Filled 2019-04-19 (×4): qty 2

## 2019-04-19 MED ORDER — BUDESONIDE 0.25 MG/2ML IN SUSP
0.2500 mg | Freq: Two times a day (BID) | RESPIRATORY_TRACT | Status: DC
  Administered 2019-04-19 – 2019-04-23 (×9): 0.25 mg via RESPIRATORY_TRACT
  Filled 2019-04-19 (×9): qty 2

## 2019-04-19 MED ORDER — SODIUM CHLORIDE 0.45 % IV SOLN
INTRAVENOUS | Status: DC
  Administered 2019-04-19: 01:00:00 via INTRAVENOUS

## 2019-04-19 MED ORDER — TRAZODONE HCL 50 MG PO TABS
50.0000 mg | ORAL_TABLET | Freq: Every day | ORAL | Status: DC
  Administered 2019-04-19 – 2019-04-22 (×4): 50 mg via ORAL
  Filled 2019-04-19 (×5): qty 1

## 2019-04-19 MED ORDER — ADULT MULTIVITAMIN W/MINERALS CH
1.0000 | ORAL_TABLET | Freq: Every day | ORAL | Status: DC
  Administered 2019-04-19 – 2019-04-21 (×2): 1 via ORAL
  Filled 2019-04-19 (×2): qty 1

## 2019-04-19 MED ORDER — LORATADINE 10 MG PO TABS
10.0000 mg | ORAL_TABLET | Freq: Every day | ORAL | Status: DC
  Administered 2019-04-19 – 2019-04-23 (×4): 10 mg via ORAL
  Filled 2019-04-19 (×4): qty 1

## 2019-04-19 MED ORDER — HEPARIN SODIUM (PORCINE) 5000 UNIT/ML IJ SOLN
5000.0000 [IU] | Freq: Three times a day (TID) | INTRAMUSCULAR | Status: DC
  Administered 2019-04-19 – 2019-04-23 (×11): 5000 [IU] via SUBCUTANEOUS
  Filled 2019-04-19 (×10): qty 1

## 2019-04-19 MED ORDER — AMLODIPINE BESYLATE 5 MG PO TABS
5.0000 mg | ORAL_TABLET | Freq: Every day | ORAL | Status: DC
  Administered 2019-04-19 – 2019-04-23 (×4): 5 mg via ORAL
  Filled 2019-04-19 (×4): qty 1

## 2019-04-19 MED ORDER — GABAPENTIN 400 MG PO CAPS
400.0000 mg | ORAL_CAPSULE | Freq: Two times a day (BID) | ORAL | Status: DC
  Administered 2019-04-19 – 2019-04-23 (×9): 400 mg via ORAL
  Filled 2019-04-19 (×9): qty 1

## 2019-04-19 MED ORDER — POTASSIUM CHLORIDE CRYS ER 20 MEQ PO TBCR
40.0000 meq | EXTENDED_RELEASE_TABLET | ORAL | Status: AC
  Administered 2019-04-19 (×2): 40 meq via ORAL
  Filled 2019-04-19 (×2): qty 2

## 2019-04-19 MED ORDER — UMECLIDINIUM BROMIDE 62.5 MCG/INH IN AEPB
1.0000 | INHALATION_SPRAY | Freq: Every day | RESPIRATORY_TRACT | Status: DC
  Administered 2019-04-20 – 2019-04-23 (×4): 1 via RESPIRATORY_TRACT
  Filled 2019-04-19 (×2): qty 7

## 2019-04-19 MED ORDER — ONDANSETRON HCL 4 MG/2ML IJ SOLN: 4.0000 mg | Freq: Four times a day (QID) | INTRAMUSCULAR | Status: DC | PRN

## 2019-04-19 MED ORDER — HYDROXYZINE HCL 25 MG PO TABS: 25.0000 mg | ORAL_TABLET | Freq: Two times a day (BID) | ORAL | Status: DC | PRN

## 2019-04-19 MED ORDER — POTASSIUM CHLORIDE CRYS ER 20 MEQ PO TBCR
30.0000 meq | EXTENDED_RELEASE_TABLET | ORAL | Status: AC
  Administered 2019-04-19 (×2): 30 meq via ORAL
  Filled 2019-04-19 (×2): qty 1

## 2019-04-19 MED ORDER — CEFAZOLIN SODIUM-DEXTROSE 2-4 GM/100ML-% IV SOLN
2.0000 g | INTRAVENOUS | Status: AC
  Administered 2019-04-20: 17:00:00 2 g via INTRAVENOUS
  Filled 2019-04-19: qty 100

## 2019-04-19 MED ORDER — BICTEGRAVIR-EMTRICITAB-TENOFOV 50-200-25 MG PO TABS
1.0000 | ORAL_TABLET | Freq: Every day | ORAL | Status: DC
  Administered 2019-04-19 – 2019-04-23 (×4): 1 via ORAL
  Filled 2019-04-19 (×5): qty 1

## 2019-04-19 MED ORDER — ONDANSETRON HCL 4 MG PO TABS: 4.0000 mg | ORAL_TABLET | Freq: Four times a day (QID) | ORAL | Status: DC | PRN

## 2019-04-19 MED ORDER — MONTELUKAST SODIUM 10 MG PO TABS
10.0000 mg | ORAL_TABLET | Freq: Every day | ORAL | Status: DC
  Administered 2019-04-19 – 2019-04-22 (×5): 10 mg via ORAL
  Filled 2019-04-19 (×5): qty 1

## 2019-04-19 MED ORDER — CHLORHEXIDINE GLUCONATE 4 % EX LIQD
60.0000 mL | Freq: Once | CUTANEOUS | Status: AC
  Administered 2019-04-20: 4 via TOPICAL

## 2019-04-19 MED ORDER — ONDANSETRON HCL 4 MG PO TABS: 8.0000 mg | ORAL_TABLET | Freq: Three times a day (TID) | ORAL | Status: DC | PRN

## 2019-04-19 MED ORDER — ENSURE PRE-SURGERY PO LIQD
296.0000 mL | Freq: Once | ORAL | Status: AC
  Administered 2019-04-20: 296 mL via ORAL
  Filled 2019-04-19: qty 296

## 2019-04-19 MED ORDER — METOPROLOL TARTRATE 25 MG PO TABS
25.0000 mg | ORAL_TABLET | Freq: Two times a day (BID) | ORAL | Status: DC
  Administered 2019-04-19 – 2019-04-23 (×10): 25 mg via ORAL
  Filled 2019-04-19 (×10): qty 1
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
## 2019-04-19 NOTE — Progress Notes (Signed)
RN attempted to call and update pt's spouse, Nancy Nordmann, on pt status. Unsuccessful at this time. Will continue to monitor.
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
## 2019-04-19 NOTE — Progress Notes (Signed)
PROGRESS NOTE    Gizzelle Lacomb  LZJ:[id number] DOB: 06-16-1966 DOA: 04/18/2019 PCP: Lorelee Market, MD  Outpatient Specialists:     Brief Narrative:  Kyley Solow is a 53 y.o. female with medical history significant of recent diagnosis of HIV with CD4 count less than 200, COPD, on home oxygen, GERD, hypertension, borderline diabetes, osteoarthritis who came to the ER as a follow-up of left shoulder pain.  She was seen at urgent care on June 28 with the same problem.  She had MRI of the left shoulder ordered in the outpatient setting that was not done it seems.  She came to the ER at [facility] where she was worked up including MRI of the left shoulder.  This came back now with septic arthritis.  Patient has been evaluated in the ER and orthopedics consulted.  Attempted aspiration of the joint was unsuccessful.  Ortho has recommended that with outpatient follow-up.  Case has been discussed with infectious disease who recommends admitting the patient for treatment.  She has pain at 8 out of 10 in the shoulder.  She has had fever.  Unable to move the shoulder not able to mobilize herself.  Patient will be admitted therefore with septic arthritis..  ED Course: Temperature is 98.8 blood pressure 137/97 pulse 109 respirate of 23 oxygen sats 98% on room air.  White count is 5.6 hemoglobin 9.7 platelet 413.  Sodium 139 potassium 3.1 chloride 109 CO2 22 BUN 6 creatinine 0.89 calcium 9.0.  Albumin is 2.2.  Urinalysis so far negative.  Chest x-ray showed no active disease.  MRI of the left shoulder showed findings most consistent with severe septic arthritis, septic rotator cuff disease, septic myositis and septic biceps, tendinitis.  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  04/19/2019: Patient seen.  No new complaints.  Input from infectious disease and orthopedic teams is appreciated.  For I&D tomorrow as per the orthopedic team.  Antibiotics after specimen has  been taken for cultures as per infectious disease team.  CD4 count will be checked as well.  No fever or chills.  Left shoulder pain persists.  Very limited movement of the left shoulder joint.   Assessment & Plan:   Principal Problem:   Septic arthritis of AC joint ([place]) Active Problems:   Morbid obesity (HCC)   Chronic pain syndrome   Hypertension   Sleep apnea   AIDS (acquired immune deficiency syndrome) (HCC)   GERD (gastroesophageal reflux disease)   Hypokalemia   #1 left pyogenic arthritis of the shoulder: Patient will be admitted.  Pain control and IV fluids.  ID has been consulted.  No antibiotics will be ordered yet until joint is aspirated and cultures obtained.  We will defer any further treatment to infectious disease. 04/19/2019: For I&D tomorrow.  Likely antibiotics after samples taken for culture.  #2 hypertension:  04/19/2019: Continue to optimize.    #3 chronic pain syndrome:  Optimize pain control  #4 HIV AIDS:  Continue Biktarvy.    #5 GERD: Continue with PPIs  #6 hypokalemia:  Repeat potassium is 3.3.   Magnesium is 1.5.   We will continue to replete potassium.   We will also replete magnesium.   Continue to monitor electrolytes.    Will discontinue IV fluids.  #7 COPD:  Stable.     DVT prophylaxis: Lovenox Code Status: Full code Family Communication: None at the present Disposition Plan: Home Consults called: Infectious disease and orthopedic team.   Procedures:   For I&D  of the left shoulder joint tomorrow.  Antimicrobials:   No antibiotics until sample has been taken for culture as per ID team.   Subjective: No new complaints. No fever or chills.  Objective: Vitals:   04/19/19 0436 04/19/19 0840 04/19/19 0853 04/19/19 1207  BP: 130/88  (!) 134/100 (!) 126/92  Pulse: 92  94 96  Resp: 18  18 16   Temp: 98.4 F (36.9 C)  98.5 F (36.9 C) 99.1 F (37.3 C)  TempSrc: Oral  Oral Oral  SpO2: 97% 94% 98% 98%  Weight:       Height:        Intake/Output Summary (Last 24 hours) at 04/19/2019 1526 Last data filed at 04/19/2019 0600 Gross per 24 hour  Intake 271.3 ml  Output -  Net 271.3 ml   Filed Weights   04/19/19 0011  Weight: 73.5 kg    Examination:  General exam: Appears calm and comfortable  Respiratory system: Clear to auscultation. Respiratory effort normal. Cardiovascular system: S1 & S2 heard Gastrointestinal system: Abdomen is nondistended, soft and nontender. No organomegaly or masses felt. Normal bowel sounds heard. Central nervous system: Alert and oriented. No focal neurological deficits. Extremities: Severe tenderness of the left shoulder joint on movement.  Data Reviewed: I have personally reviewed following labs and imaging studies  CBC: Recent Labs  Lab 04/18/19 1833 04/19/19 0022  WBC 5.6 4.3  NEUTROABS 1.8  --   HGB 9.7* 10.3*  HCT 30.9* 32.6*  MCV 81.3 81.9  PLT 413* 379*   Basic Metabolic Panel: Recent Labs  Lab 04/18/19 1833 04/19/19 0022 04/19/19 1124  NA 139 140 136  K 3.1* 2.8* 3.3*  CL 109 108 107  CO2 22 23 22   GLUCOSE 87 89 105*  BUN 6 5* <5*  CREATININE 0.89 0.86 0.78  CALCIUM 9.0 9.2 8.5*  MG  --   --  1.5*  PHOS  --   --  3.0   GFR: Estimated Creatinine Clearance: 81.7 mL/min (by C-G formula based on SCr of 0.78 mg/dL). Liver Function Tests: Recent Labs  Lab 04/18/19 1833 04/19/19 0022 04/19/19 1124  AST 18 18  --   ALT 11 11  --   ALKPHOS 74 76  --   BILITOT 0.7 0.6  --   PROT 9.3* 9.4*  --   ALBUMIN 2.2* 2.2* 1.8*   No results for input(s): LIPASE, AMYLASE in the last 168 hours. No results for input(s): AMMONIA in the last 168 hours. Coagulation Profile: No results for input(s): INR, PROTIME in the last 168 hours. Cardiac Enzymes: No results for input(s): CKTOTAL, CKMB, CKMBINDEX, TROPONINI in the last 168 hours. BNP (last 3 results) No results for input(s): PROBNP in the last 8760 hours. HbA1C: No results for input(s): HGBA1C  in the last 72 hours. CBG: No results for input(s): GLUCAP in the last 168 hours. Lipid Profile: No results for input(s): CHOL, HDL, LDLCALC, TRIG, CHOLHDL, LDLDIRECT in the last 72 hours. Thyroid Function Tests: No results for input(s): TSH, T4TOTAL, FREET4, T3FREE, THYROIDAB in the last 72 hours. Anemia Panel: No results for input(s): VITAMINB12, FOLATE, FERRITIN, TIBC, IRON, RETICCTPCT in the last 72 hours. Urine analysis:    Component Value Date/Time   COLORURINE YELLOW 04/18/2019 1940   APPEARANCEUR CLEAR 04/18/2019 1940   LABSPEC 1.017 04/18/2019 1940   PHURINE 5.0 04/18/2019 1940   GLUCOSEU NEGATIVE 04/18/2019 1940   HGBUR MODERATE (A) 04/18/2019 [place] NEGATIVE 04/18/2019 [place] NEGATIVE 04/18/2019 1940  PROTEINUR NEGATIVE 04/18/2019 1940   NITRITE NEGATIVE 04/18/2019 1940   LEUKOCYTESUR TRACE (A) 04/18/2019 1940   Sepsis Labs: @LABRCNTIP (procalcitonin:4,lacticidven:4)  ) Recent Results (from the past 240 hour(s))  Blood culture (routine x 2)     Status: None (Preliminary result)   Collection Time: 04/18/19  9:56 PM   Specimen: BLOOD  Result Value Ref Range Status   Specimen Description BLOOD RIGHT ANTECUBITAL  Final   Special Requests   Final    BOTTLES DRAWN AEROBIC AND ANAEROBIC Blood Culture adequate volume Performed at [facility], [place] [address] [place], [place] [address]    Culture PENDING  Incomplete   Report Status PENDING  Incomplete  Blood culture (routine x 2)     Status: None (Preliminary result)   Collection Time: 04/18/19  9:56 PM   Specimen: BLOOD RIGHT WRIST  Result Value Ref Range Status   Specimen Description BLOOD RIGHT WRIST  Final   Special Requests   Final    BOTTLES DRAWN AEROBIC ONLY Blood Culture adequate volume Performed at [facility], [place] [address] [place], [place] [address]    Culture PENDING  Incomplete   Report Status PENDING  Incomplete  SARS Coronavirus 2 (CEPHEID - Performed in [place] [facility] hospital lab), Hosp Order     Status: None   Collection Time: 04/18/19 11:35 PM   Specimen: Nasopharyngeal Swab  Result Value Ref Range Status   SARS Coronavirus 2 NEGATIVE NEGATIVE Final    Comment: (NOTE) If result is NEGATIVE SARS-CoV-2 target nucleic acids are NOT DETECTED. The SARS-CoV-2 RNA is generally detectable in upper and lower  respiratory specimens during the acute phase of infection. The lowest  concentration of SARS-CoV-2 viral copies this assay can detect is 250  copies / mL. A negative result does not preclude SARS-CoV-2 infection  and should not be used as the sole basis for treatment or other  patient management decisions.  A negative result may occur with  improper specimen collection / handling, submission of specimen other  than nasopharyngeal swab, presence of viral mutation(s) within the  areas targeted by this assay, and inadequate number of viral copies  (<250 copies / mL). A negative result must be combined with clinical  observations, patient history, and epidemiological information. If result is POSITIVE SARS-CoV-2 target nucleic acids are DETECTED. The SARS-CoV-2 RNA is generally detectable in upper and lower  respiratory specimens dur ing the acute phase of infection.  Positive  results are indicative of active infection with SARS-CoV-2.  Clinical  correlation with patient history and other diagnostic information is  necessary to determine patient infection status.  Positive results do  not rule out bacterial infection or co-infection with other viruses. If result is PRESUMPTIVE POSTIVE SARS-CoV-2 nucleic acids MAY BE PRESENT.   A presumptive positive result was obtained on the submitted specimen  and confirmed on repeat testing.  While 2019 novel coronavirus  (SARS-CoV-2) nucleic acids may be present in the submitted sample  additional confirmatory testing may be necessary for epidemiological  and / or clinical management purposes  to  differentiate between  SARS-CoV-2 and other Sarbecovirus currently known to infect humans.  If clinically indicated additional testing with an alternate test  methodology [phone]) is advised. The SARS-CoV-2 RNA is generally  detectable in upper and lower respiratory sp ecimens during the acute  phase of infection. The expected result is Negative. Fact Sheet for Patients:  StrictlyIdeas.no Fact Sheet for Healthcare Providers: BankingDealers.co.za This test is not yet  approved or cleared by the [place] and has been authorized for detection and/or diagnosis of SARS-CoV-2 by FDA under an Emergency Use Authorization (EUA).  This EUA will remain in effect (meaning this test can be used) for the duration of the COVID-19 declaration under Section 564(b)(1) of the Act, 21 U.S.C. section 360bbb-3(b)(1), unless the authorization is terminated or revoked sooner. Performed at [facility], [place] [address] [place], [place] [address]          Radiology Studies: Dg Chest 2 View  Result Date: 04/18/2019 CLINICAL DATA:  Septic shoulder. EXAM: CHEST - 2 VIEW COMPARISON:  None. FINDINGS: Heart and mediastinal contours are within normal limits. No focal opacities or effusions. No acute bony abnormality. IMPRESSION: No active cardiopulmonary disease. Electronically Signed   By: Rolm Baptise M.D.   On: 04/18/2019 19:05   Mr Shoulder Left Wo Contrast  Result Date: 04/18/2019 CLINICAL DATA:  Chronic left shoulder pain. EXAM: MRI OF THE LEFT SHOULDER WITHOUT CONTRAST TECHNIQUE: Multiplanar, multisequence MR imaging of the shoulder was performed. No intravenous contrast was administered. COMPARISON:  Radiographs 03/24/2019 FINDINGS: Rotator cuff: Severe rotator cuff tendinopathy/tendinosis which is probably septic related. No obvious full-thickness tear. Muscles: Extensive edema like signal abnormality extending back into the rotator cuff muscles  likely septic myositis. Biceps long head: Markedly thickened and demonstrating diffuse fluid-like signal intensity likely septic tendinopathy with large intrasubstance tear containing fluid/pus. Acromioclavicular Joint: Intact. Type 2 acromion. No lateral downsloping. Glenohumeral Joint: MR findings highly suspicious for septic arthritis with a large complex joint effusion, severe synovitis and associated osteomyelitis involving the humerus and glenoid with bone abscesses. The articular cartilage is likely destroyed. Labrum:  Severely degenerated and torn. Bones: Extensive marrow edema and multiloculated fluid collections in the humeral head and glenoid consistent with osteomyelitis and bone abscesses. Other: Septic subacromial/subdeltoid bursitis. IMPRESSION: 1. MR findings most consistent with severe septic arthritis, septic rotator cuff disease, septic myositis and septic biceps tendinitis. 2. 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 These results will be called to the ordering clinician or representative by the Radiologist Assistant, and communication documented in the PACS or zVision Dashboard. Electronically Signed   By: Marijo Sanes M.D.   On: 04/18/2019 14:23        Scheduled Meds: . amLODipine  5 mg Oral Daily  . bictegravir-emtricitabine-tenofovir AF  1 tablet Oral Daily  . budesonide  0.25 mg Inhalation BID  . buPROPion  300 mg Oral Daily  . gabapentin  400 mg Oral BID  . heparin  5,000 Units Subcutaneous Q8H  . loratadine  10 mg Oral Daily  . metoprolol tartrate  25 mg Oral BID  . montelukast  10 mg Oral QHS  . multivitamin with minerals  1 tablet Oral Q breakfast  . sodium chloride flush  3 mL Intravenous Once  . traZODone  50 mg Oral QHS  . umeclidinium bromide  1 puff Inhalation Daily   Continuous Infusions:   LOS: 1 day    Time spent: 8 Minutes    Dana Allan, MD  [facility] Pager #: [phone] 7PM-7AM contact night  coverage as above
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
## 2019-04-20 ENCOUNTER — Encounter (HOSPITAL_COMMUNITY): Payer: Self-pay | Admitting: Anesthesiology

## 2019-04-20 ENCOUNTER — Encounter (HOSPITAL_COMMUNITY)
Admission: EM | Disposition: A | Discharge status: Home Health | Payer: Self-pay | Source: Home / Self Care | Attending: Internal Medicine

## 2019-04-20 ENCOUNTER — Inpatient Hospital Stay (HOSPITAL_COMMUNITY): Payer: Medicaid Other | Admitting: Certified Registered Nurse Anesthetist

## 2019-04-20 ENCOUNTER — Inpatient Hospital Stay (HOSPITAL_COMMUNITY): Payer: Medicaid Other

## 2019-04-20 DIAGNOSIS — M869 Osteomyelitis, unspecified: Secondary | ICD-10-CM

## 2019-04-20 DIAGNOSIS — L02414 Cutaneous abscess of left upper limb: Secondary | ICD-10-CM

## 2019-04-20 DIAGNOSIS — R110 Nausea: Secondary | ICD-10-CM

## 2019-04-20 HISTORY — PX: EXCISIONAL TOTAL SHOULDER ARTHROPLASTY WITH ANTIBIOTIC SPACER: SHX6264

## 2019-04-20 LAB — T-HELPER CELLS (CD4) COUNT (NOT AT ARMC)
CD4 % Helper T Cell: 14 % — ABNORMAL LOW (ref 33–65)
CD4 T Cell Abs: 267 /uL — ABNORMAL LOW (ref 400–1790)

## 2019-04-20 LAB — RENAL FUNCTION PANEL
Albumin: 1.6 g/dL — ABNORMAL LOW (ref 3.5–5.0)
Anion gap: 6 (ref 5–15)
BUN: 6 mg/dL (ref 6–20)
CO2: 19 mmol/L — ABNORMAL LOW (ref 22–32)
Calcium: 8.4 mg/dL — ABNORMAL LOW (ref 8.9–10.3)
Chloride: 112 mmol/L — ABNORMAL HIGH (ref 98–111)
Creatinine, Ser: 0.95 mg/dL (ref 0.44–1.00)
GFR calc Af Amer: >60 mL/min (ref >60)
GFR calc non Af Amer: >60 mL/min (ref >60)
Glucose, Bld: 137 mg/dL — ABNORMAL HIGH (ref 70–99)
Phosphorus: 2.5 mg/dL (ref 2.5–4.6)
Potassium: 4.2 mmol/L (ref 3.5–5.1)
Sodium: 137 mmol/L (ref 135–145)

## 2019-04-20 LAB — GLUCOSE, CAPILLARY
Glucose-Capillary: 71 mg/dL (ref 70–99)
Glucose-Capillary: 93 mg/dL (ref 70–99)

## 2019-04-20 LAB — MAGNESIUM: Magnesium: 2.1 mg/dL (ref 1.7–2.4)

## 2019-04-20 SURGERY — ARTHROSCOPY, SHOULDER
Anesthesia: Choice | Laterality: Left

## 2019-04-20 SURGERY — REMOVAL, HARDWARE, SHOULDER, WITH IRRIGATION, DEBRIDEMENT, AND INSERTION OF ANTIBIOTIC BEADS OR ANTIBIOTIC SPACER
Anesthesia: General | Laterality: Left

## 2019-04-20 MED ORDER — SODIUM CHLORIDE 0.9 % IV SOLN
INTRAVENOUS | Status: DC | PRN
  Administered 2019-04-20: 16:00:00 20 ug/min via INTRAVENOUS

## 2019-04-20 MED ORDER — FENTANYL CITRATE (PF) 250 MCG/5ML IJ SOLN
INTRAMUSCULAR | Status: DC | PRN
  Administered 2019-04-20: 50 ug via INTRAVENOUS
  Administered 2019-04-20: 100 ug via INTRAVENOUS
  Administered 2019-04-20 (×4): 50 ug via INTRAVENOUS

## 2019-04-20 MED ORDER — SENNOSIDES-DOCUSATE SODIUM 8.6-50 MG PO TABS: 1.0000 | ORAL_TABLET | Freq: Every evening | ORAL | Status: DC | PRN

## 2019-04-20 MED ORDER — MINERAL OIL LIGHT OIL
TOPICAL_OIL | Freq: Once | Status: DC
  Filled 2019-04-20: qty 10

## 2019-04-20 MED ORDER — TRANEXAMIC ACID-NACL 1000-0.7 MG/100ML-% IV SOLN
INTRAVENOUS | Status: DC | PRN
  Administered 2019-04-20: 1000 mg via INTRAVENOUS

## 2019-04-20 MED ORDER — ROCURONIUM BROMIDE 10 MG/ML (PF) SYRINGE
PREFILLED_SYRINGE | INTRAVENOUS | Status: DC | PRN
  Administered 2019-04-20: 50 mg via INTRAVENOUS
  Administered 2019-04-20: 20 mg via INTRAVENOUS

## 2019-04-20 MED ORDER — DEXAMETHASONE SODIUM PHOSPHATE 10 MG/ML IJ SOLN
INTRAMUSCULAR | Status: DC | PRN
  Administered 2019-04-20: 5 mg via INTRAVENOUS

## 2019-04-20 MED ORDER — TRANEXAMIC ACID-NACL 1000-0.7 MG/100ML-% IV SOLN
INTRAVENOUS | Status: AC
  Filled 2019-04-20: qty 100

## 2019-04-20 MED ORDER — ACETAMINOPHEN 500 MG PO TABS
500.0000 mg | ORAL_TABLET | Freq: Four times a day (QID) | ORAL | Status: AC
  Administered 2019-04-20 – 2019-04-21 (×4): 500 mg via ORAL
  Filled 2019-04-20 (×4): qty 1

## 2019-04-20 MED ORDER — MINERAL OIL LIGHT 100 % EX OIL
TOPICAL_OIL | CUTANEOUS | Status: DC | PRN
  Administered 2019-04-20: 1 via TOPICAL

## 2019-04-20 MED ORDER — LACTATED RINGERS IV SOLN
INTRAVENOUS | Status: DC
  Administered 2019-04-20: 15:00:00 via INTRAVENOUS

## 2019-04-20 MED ORDER — 0.9 % SODIUM CHLORIDE (POUR BTL) OPTIME
TOPICAL | Status: DC | PRN
  Administered 2019-04-20: 1000 mL

## 2019-04-20 MED ORDER — FENTANYL CITRATE (PF) 250 MCG/5ML IJ SOLN
INTRAMUSCULAR | Status: AC
  Filled 2019-04-20: qty 5

## 2019-04-20 MED ORDER — METOCLOPRAMIDE HCL 5 MG PO TABS: 5.0000 mg | ORAL_TABLET | Freq: Three times a day (TID) | ORAL | Status: DC | PRN

## 2019-04-20 MED ORDER — SODIUM CHLORIDE 0.9 % IR SOLN
Status: DC | PRN
  Administered 2019-04-20: 3000 mL

## 2019-04-20 MED ORDER — ALBUMIN HUMAN 5 % IV SOLN
INTRAVENOUS | Status: DC | PRN
  Administered 2019-04-20: 17:00:00 via INTRAVENOUS

## 2019-04-20 MED ORDER — PHENYLEPHRINE 40 MCG/ML (10ML) SYRINGE FOR IV PUSH (FOR BLOOD PRESSURE SUPPORT)
PREFILLED_SYRINGE | INTRAVENOUS | Status: DC | PRN
  Administered 2019-04-20 (×2): 120 ug via INTRAVENOUS

## 2019-04-20 MED ORDER — TOBRAMYCIN SULFATE 1.2 G IJ SOLR
INTRAMUSCULAR | Status: DC | PRN
  Administered 2019-04-20 (×2): 1.2 g

## 2019-04-20 MED ORDER — ONDANSETRON HCL 4 MG/2ML IJ SOLN
INTRAMUSCULAR | Status: AC
  Filled 2019-04-20: qty 2

## 2019-04-20 MED ORDER — LIDOCAINE 2% (20 MG/ML) 5 ML SYRINGE
INTRAMUSCULAR | Status: DC | PRN
  Administered 2019-04-20: 60 mg via INTRAVENOUS

## 2019-04-20 MED ORDER — METOCLOPRAMIDE HCL 5 MG/ML IJ SOLN: 5.0000 mg | Freq: Three times a day (TID) | INTRAMUSCULAR | Status: DC | PRN

## 2019-04-20 MED ORDER — MIDAZOLAM HCL 2 MG/2ML IJ SOLN
INTRAMUSCULAR | Status: AC
  Filled 2019-04-20: qty 2

## 2019-04-20 MED ORDER — ONDANSETRON HCL 4 MG/2ML IJ SOLN
INTRAMUSCULAR | Status: DC | PRN
  Administered 2019-04-20: 4 mg via INTRAVENOUS

## 2019-04-20 MED ORDER — DEXAMETHASONE SODIUM PHOSPHATE 10 MG/ML IJ SOLN
INTRAMUSCULAR | Status: AC
  Filled 2019-04-20: qty 1

## 2019-04-20 MED ORDER — SUGAMMADEX SODIUM 200 MG/2ML IV SOLN
INTRAVENOUS | Status: DC | PRN
  Administered 2019-04-20: 180 mg via INTRAVENOUS

## 2019-04-20 MED ORDER — FENTANYL CITRATE (PF) 100 MCG/2ML IJ SOLN
25.0000 ug | INTRAMUSCULAR | Status: DC | PRN
  Administered 2019-04-20 (×2): 50 ug via INTRAVENOUS

## 2019-04-20 MED ORDER — TOBRAMYCIN SULFATE 1.2 G IJ SOLR
INTRAMUSCULAR | Status: AC
  Filled 2019-04-20: qty 2.4

## 2019-04-20 MED ORDER — ACETAMINOPHEN 500 MG PO TABS
1000.0000 mg | ORAL_TABLET | Freq: Once | ORAL | Status: AC
  Administered 2019-04-20: 1000 mg via ORAL
  Filled 2019-04-20: qty 2

## 2019-04-20 MED ORDER — PROPOFOL 10 MG/ML IV BOLUS
INTRAVENOUS | Status: AC
  Filled 2019-04-20: qty 20

## 2019-04-20 MED ORDER — PHENYLEPHRINE 40 MCG/ML (10ML) SYRINGE FOR IV PUSH (FOR BLOOD PRESSURE SUPPORT)
PREFILLED_SYRINGE | INTRAVENOUS | Status: AC
  Filled 2019-04-20: qty 10

## 2019-04-20 MED ORDER — MENTHOL 3 MG MT LOZG: 1.0000 | LOZENGE | OROMUCOSAL | Status: DC | PRN

## 2019-04-20 MED ORDER — SODIUM CHLORIDE 0.9 % IV SOLN
INTRAVENOUS | Status: DC
  Administered 2019-04-21 (×2): via INTRAVENOUS

## 2019-04-20 MED ORDER — MUPIROCIN 2 % EX OINT
1.0000 "application " | TOPICAL_OINTMENT | Freq: Two times a day (BID) | CUTANEOUS | Status: DC
  Administered 2019-04-20 – 2019-04-23 (×7): 1 via NASAL
  Filled 2019-04-20 (×4): qty 22

## 2019-04-20 MED ORDER — ACETAMINOPHEN 325 MG PO TABS
325.0000 mg | ORAL_TABLET | Freq: Four times a day (QID) | ORAL | Status: DC | PRN
  Administered 2019-04-21 – 2019-04-22 (×3): 650 mg via ORAL
  Filled 2019-04-20 (×3): qty 2

## 2019-04-20 MED ORDER — METHOCARBAMOL 500 MG PO TABS
500.0000 mg | ORAL_TABLET | Freq: Four times a day (QID) | ORAL | Status: DC | PRN
  Administered 2019-04-21 – 2019-04-23 (×3): 500 mg via ORAL
  Filled 2019-04-20 (×3): qty 1

## 2019-04-20 MED ORDER — ZOLPIDEM TARTRATE 5 MG PO TABS: 5.0000 mg | ORAL_TABLET | Freq: Every evening | ORAL | Status: DC | PRN

## 2019-04-20 MED ORDER — DIPHENHYDRAMINE HCL 12.5 MG/5ML PO ELIX: 12.5000 mg | ORAL_SOLUTION | ORAL | Status: DC | PRN

## 2019-04-20 MED ORDER — CHLORHEXIDINE GLUCONATE CLOTH 2 % EX PADS
6.0000 | MEDICATED_PAD | Freq: Every day | CUTANEOUS | Status: DC
  Administered 2019-04-20 – 2019-04-23 (×3): 6 via TOPICAL

## 2019-04-20 MED ORDER — METHOCARBAMOL 1000 MG/10ML IJ SOLN
500.0000 mg | Freq: Four times a day (QID) | INTRAVENOUS | Status: DC | PRN
  Filled 2019-04-20: qty 5

## 2019-04-20 MED ORDER — ONDANSETRON HCL 4 MG PO TABS: 4.0000 mg | ORAL_TABLET | Freq: Four times a day (QID) | ORAL | Status: DC | PRN

## 2019-04-20 MED ORDER — ALUMINUM HYDROXIDE GEL 320 MG/5ML PO SUSP
15.0000 mL | ORAL | Status: DC | PRN
  Filled 2019-04-20: qty 30

## 2019-04-20 MED ORDER — PHENOL 1.4 % MT LIQD: 1.0000 | OROMUCOSAL | Status: DC | PRN

## 2019-04-20 MED ORDER — SUCCINYLCHOLINE CHLORIDE 200 MG/10ML IV SOSY
PREFILLED_SYRINGE | INTRAVENOUS | Status: DC | PRN
  Administered 2019-04-20: 100 mg via INTRAVENOUS

## 2019-04-20 MED ORDER — PROPOFOL 10 MG/ML IV BOLUS
INTRAVENOUS | Status: DC | PRN
  Administered 2019-04-20: 120 mg via INTRAVENOUS

## 2019-04-20 MED ORDER — ONDANSETRON HCL 4 MG/2ML IJ SOLN: 4.0000 mg | Freq: Four times a day (QID) | INTRAMUSCULAR | Status: DC | PRN

## 2019-04-20 MED ORDER — VANCOMYCIN HCL 1000 MG IV SOLR
INTRAVENOUS | Status: DC | PRN
  Administered 2019-04-20 (×2): 1000 mg

## 2019-04-20 MED ORDER — VANCOMYCIN HCL 1000 MG IV SOLR
INTRAVENOUS | Status: AC
  Filled 2019-04-20: qty 2000

## 2019-04-20 MED ORDER — DOCUSATE SODIUM 100 MG PO CAPS
100.0000 mg | ORAL_CAPSULE | Freq: Two times a day (BID) | ORAL | Status: DC
  Administered 2019-04-20 – 2019-04-23 (×6): 100 mg via ORAL
  Filled 2019-04-20 (×6): qty 1

## 2019-04-20 MED ORDER — FENTANYL CITRATE (PF) 100 MCG/2ML IJ SOLN
INTRAMUSCULAR | Status: AC
  Filled 2019-04-20: qty 2

## 2019-04-20 MED ORDER — MIDAZOLAM HCL 2 MG/2ML IJ SOLN
INTRAMUSCULAR | Status: DC | PRN
  Administered 2019-04-20: 2 mg via INTRAVENOUS

## 2019-04-20 SURGICAL SUPPLY — 80 items
BLADE SAW SAG 73X25 THK (BLADE) ×2
BLADE SAW SGTL 73X25 THK (BLADE) ×1 IMPLANT
BLADE SURG 15 STRL LF DISP TIS (BLADE) ×1 IMPLANT
BLADE SURG 15 STRL SS (BLADE) ×2
BOWL SMART MIX CTS (DISPOSABLE) ×2 IMPLANT
BUR SURG 4X8 MED (BURR) IMPLANT
BURR SURG 4MMX8MM MEDIUM (BURR) ×1
BURR SURG 4X8 MED (BURR) ×2
CEMENT BONE DEPUY (Cement) ×5 IMPLANT
CHLORAPREP W/TINT 26 (MISCELLANEOUS) ×3 IMPLANT
CLOSURE WOUND 1/2 X4 (GAUZE/BANDAGES/DRESSINGS) ×1
CONT SPEC 4OZ CLIKSEAL STRL BL (MISCELLANEOUS) ×2 IMPLANT
COVER SURGICAL LIGHT HANDLE (MISCELLANEOUS) ×3 IMPLANT
COVER WAND RF STERILE (DRAPES) ×1 IMPLANT
DRAPE INCISE IOBAN 66X45 STRL (DRAPES) ×3 IMPLANT
DRAPE ORTHO SPLIT 77X108 STRL (DRAPES) ×4
DRAPE SURG 17X23 STRL (DRAPES) ×3 IMPLANT
DRAPE SURG ORHT 6 SPLT 77X108 (DRAPES) ×2 IMPLANT
DRAPE U-SHAPE 47X51 STRL (DRAPES) ×3 IMPLANT
DRSG ADAPTIC 3X8 NADH LF (GAUZE/BANDAGES/DRESSINGS) ×1 IMPLANT
DRSG AQUACEL AG ADV 3.5X 6 (GAUZE/BANDAGES/DRESSINGS) ×2 IMPLANT
DRSG PAD ABDOMINAL 8X10 ST (GAUZE/BANDAGES/DRESSINGS) ×2 IMPLANT
ELECT BLADE 4.0 EZ CLEAN MEGAD (MISCELLANEOUS)
ELECT REM PT RETURN 9FT ADLT (ELECTROSURGICAL) ×3
ELECTRODE BLDE 4.0 EZ CLN MEGD (MISCELLANEOUS) ×1 IMPLANT
ELECTRODE REM PT RTRN 9FT ADLT (ELECTROSURGICAL) ×1 IMPLANT
EVACUATOR 1/8 PVC DRAIN (DRAIN) ×3 IMPLANT
GAUZE SPONGE 4X4 12PLY STRL (GAUZE/BANDAGES/DRESSINGS) ×1 IMPLANT
GAUZE XEROFORM 1X8 LF (GAUZE/BANDAGES/DRESSINGS) ×2 IMPLANT
GLOVE BIO SURGEON STRL SZ7 (GLOVE) ×3 IMPLANT
GLOVE BIO SURGEON STRL SZ7.5 (GLOVE) ×3 IMPLANT
GLOVE BIOGEL PI IND STRL 8 (GLOVE) ×1 IMPLANT
GLOVE BIOGEL PI INDICATOR 8 (GLOVE) ×2
GOWN STRL REUS W/ TWL LRG LVL3 (GOWN DISPOSABLE) ×3 IMPLANT
GOWN STRL REUS W/ TWL XL LVL3 (GOWN DISPOSABLE) ×2 IMPLANT
GOWN STRL REUS W/TWL LRG LVL3 (GOWN DISPOSABLE) ×6
GOWN STRL REUS W/TWL XL LVL3 (GOWN DISPOSABLE) ×4
HANDPIECE INTERPULSE COAX TIP (DISPOSABLE) ×2
HEMOSTAT SURGICEL 2X14 (HEMOSTASIS) ×1 IMPLANT
HOOD PEEL AWAY FLYTE STAYCOOL (MISCELLANEOUS) ×2 IMPLANT
KIT BASIN OR (CUSTOM PROCEDURE TRAY) ×3 IMPLANT
KIT TURNOVER KIT B (KITS) ×3 IMPLANT
MANIFOLD NEPTUNE II (INSTRUMENTS) ×3 IMPLANT
NDL HYPO 25GX1X1/2 BEV (NEEDLE) ×1 IMPLANT
NDL MAYO TROCAR (NEEDLE) ×1 IMPLANT
NEEDLE HYPO 25GX1X1/2 BEV (NEEDLE) ×3 IMPLANT
NEEDLE MAYO TROCAR (NEEDLE) ×3 IMPLANT
NS IRRIG 1000ML POUR BTL (IV SOLUTION) ×3 IMPLANT
PACK SHOULDER (CUSTOM PROCEDURE TRAY) ×3 IMPLANT
PAD ARMBOARD 7.5X6 YLW CONV (MISCELLANEOUS) ×6 IMPLANT
RETRIEVER SUT HEWSON (MISCELLANEOUS) IMPLANT
SET HNDPC FAN SPRY TIP SCT (DISPOSABLE) ×1 IMPLANT
SLING ARM FOAM STRAP MED (SOFTGOODS) ×2 IMPLANT
SLING ARM IMMOBILIZER LRG (SOFTGOODS) ×3 IMPLANT
SLING ARM IMMOBILIZER MED (SOFTGOODS) IMPLANT
SMARTMIX MINI TOWER (MISCELLANEOUS) ×3
SPONGE LAP 18X18 RF (DISPOSABLE) ×5 IMPLANT
SPONGE LAP 4X18 RFD (DISPOSABLE) ×3 IMPLANT
STRIP CLOSURE SKIN 1/2X4 (GAUZE/BANDAGES/DRESSINGS) ×2 IMPLANT
SUCTION FRAZIER HANDLE 10FR (MISCELLANEOUS) ×2
SUCTION TUBE FRAZIER 10FR DISP (MISCELLANEOUS) ×1 IMPLANT
SUPPORT WRAP ARM LG (MISCELLANEOUS) ×3 IMPLANT
SUT ETHIBOND NAB CT1 #1 30IN (SUTURE) ×1 IMPLANT
SUT ETHILON 2 0 PSLX (SUTURE) ×2 IMPLANT
SUT FIBERWIRE #2 38 T-5 BLUE (SUTURE)
SUT MNCRL AB 4-0 PS2 18 (SUTURE) ×1 IMPLANT
SUT SILK 2 0 TIES 17X18 (SUTURE)
SUT SILK 2-0 18XBRD TIE BLK (SUTURE) ×1 IMPLANT
SUT VIC AB 0 CTB1 27 (SUTURE) ×1 IMPLANT
SUT VIC AB 2-0 CT1 27 (SUTURE) ×4
SUT VIC AB 2-0 CT1 TAPERPNT 27 (SUTURE) ×1 IMPLANT
SUTURE FIBERWR #2 38 T-5 BLUE (SUTURE) ×1 IMPLANT
SYR 30ML LL (SYRINGE) ×3 IMPLANT
SYRINGE TOOMEY DISP (SYRINGE) ×2 IMPLANT
TOWEL GREEN STERILE (TOWEL DISPOSABLE) ×3 IMPLANT
TOWEL GREEN STERILE FF (TOWEL DISPOSABLE) ×3 IMPLANT
TOWER SMARTMIX MINI (MISCELLANEOUS) ×1 IMPLANT
TRAY FOLEY MTR SLVR 16FR STAT (SET/KITS/TRAYS/PACK) IMPLANT
WATER STERILE IRR 1000ML POUR (IV SOLUTION) ×1 IMPLANT
YANKAUER SUCT BULB TIP NO VENT (SUCTIONS) ×3 IMPLANT

## 2019-04-20 NOTE — Anesthesia Procedure Notes (Signed)
Procedure Name: Intubation Date/Time: 04/20/2019 4:21 PM Performed by: Elayne Snare, CRNA Pre-anesthesia Checklist: Patient identified, Emergency Drugs available, Suction available and Patient being monitored Patient Re-evaluated:Patient Re-evaluated prior to induction Oxygen Delivery Method: Circle System Utilized Preoxygenation: Pre-oxygenation with 100% oxygen Induction Type: IV induction and Rapid sequence Laryngoscope Size: Mac and 3 Grade View: Grade I Tube type: Oral Tube size: 7.0 mm Number of attempts: 1 Airway Equipment and Method: Stylet and Oral airway Placement Confirmation: ETT inserted through vocal cords under direct vision,  positive ETCO2 and breath sounds checked- equal and bilateral Secured at: 20 cm Tube secured with: Tape Dental Injury: Teeth and Oropharynx as per pre-operative assessment

## 2019-04-20 NOTE — Interval H&P Note (Signed)
History and Physical Interval Note:  04/20/2019 4:10 PM  Alicia Lynch  has presented today for surgery, with the diagnosis of infected left shoulder.  The various methods of treatment have been discussed with the patient and family. After consideration of risks, benefits and other options for treatment, the patient has consented to  Procedure(s): EXCISIONAL OSTEOMYLITIS OF LEFT SHOULDER WITH ANTIBIOTIC SPACER (Left) as a surgical intervention.  The patient's history has been reviewed, patient examined, no change in status, stable for surgery.  I have reviewed the patient's chart and labs.  Questions were answered to the patient's satisfaction.     [name]

## 2019-04-20 NOTE — Progress Notes (Signed)
Pt returned to 5N13. Call bell within reach. Pt in no distress.

## 2019-04-20 NOTE — Anesthesia Preprocedure Evaluation (Addendum)
Anesthesia Evaluation  Patient identified by MRN, date of birth, ID band Patient awake    Reviewed: Allergy & Precautions, H&P , NPO status , Patient's Chart, lab work & pertinent test results, reviewed documented beta blocker date and time   Airway Mallampati: II  TM Distance: >3 FB Neck ROM: Full    Dental no notable dental hx. (+) Edentulous Upper, Edentulous Lower, Dental Advisory Given   Pulmonary sleep apnea , COPD,  COPD inhaler,    Pulmonary exam normal breath sounds clear to auscultation       Cardiovascular hypertension, Pt. on medications and Pt. on home beta blockers  Rhythm:Regular Rate:Normal     Neuro/Psych  Headaches, negative psych ROS   GI/Hepatic negative GI ROS, Neg liver ROS,   Endo/Other  diabetes  Renal/GU negative Renal ROS  negative genitourinary   Musculoskeletal  (+) Arthritis , Osteoarthritis,    Abdominal   Peds  Hematology  (+) Blood dyscrasia, anemia ,   Anesthesia Other Findings   Reproductive/Obstetrics negative OB ROS                            Anesthesia Physical Anesthesia Plan  ASA: II  Anesthesia Plan: General   Post-op Pain Management:    Induction: Intravenous  PONV Risk Score and Plan: 3 and Ondansetron, Dexamethasone and Midazolam  Airway Management Planned: Oral ETT  Additional Equipment:   Intra-op Plan:   Post-operative Plan: Extubation in OR  Informed Consent: I have reviewed the patients History and Physical, chart, labs and discussed the procedure including the risks, benefits and alternatives for the proposed anesthesia with the patient or authorized representative who has indicated his/her understanding and acceptance.     Dental advisory given  Plan Discussed with: CRNA  Anesthesia Plan Comments:        Anesthesia Quick Evaluation

## 2019-04-20 NOTE — Op Note (Signed)
Procedure(s):   Alicia Lynch female 53 y.o. 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 female  With new diagnosis of HIV with several weeks to months of left shoulder pain which came on insidiously.  She was found on MRI to have advanced changes in the joint consistent with septic arthritis and osteomyelitis in the humeral head as well as surrounding myositis.  I was consulted today to get involved in her care given my specialization in surgery of the shoulder.  She was indicated for open surgical debridement and cement hemiarthroplasty with antibiotic impregnated spacer in an effort to try and eliminate this severe infection.   Surgeon: [name]   Assistants: Jeanmarie Hubert PA-C [facility] was present and scrubbed throughout the procedure and was essential in positioning, retraction, exposure, and closure)  Anesthesia: General endotracheal anesthesia   Procedure Detail   Findings: She had findings consistent with severe infection in the joint and in the proximal humerus.  There was not gross pus but there was significant phlegmon throughout the joint and the humeral head had significant destruction of the metaphyseal bone with large areas of abscess filled with phlegmon.  The humeral head was removed as well as all questionable bone.  The joint was copiously debrided of inflamed and unhealthy synovium and copious irrigation was used.  A Tornier cement spacer size 2 with a 43 head was implanted with 2 vials of vancomycin, 2 vials of tobramycin mixed with the  cement.   Estimated Blood Loss:  400 mL         Drains: 1 medium Hemovac  Blood Given: none          Specimens: Multiple specimens were sent including the humeral head  Complications:  * No complications entered in OR log *         Disposition: PACU - hemodynamically stable.         Condition: stable    Procedure:   The patient was identified in the preoperative holding area where I personally marked the operative extremity after verifying with the patient and consent. She  was taken to the operating room where She was transferred to the   operative table.  The patient received an interscalene block in   the holding area by the attending anesthesiologist.  General anesthesia was induced   in the operating room without complication.  The patient did receive IV  Ancef prior to the commencement of the procedure.  The patient was   placed in the beach-chair position with the back raised about 30   degrees.  The nonoperative extremity and head and neck were carefully   positioned and padded protecting against neurovascular compromise.  The   left upper extremity was then prepped and draped in the standard sterile   fashion.    The appropriate operative time-out was performed with   Anesthesia, the perioperative staff, as well as myself and we all agreed   that the left side was the correct operative site.  An approximately   8 cm incision was made from the tip of the coracoid to the center point of the   humerus at the level of the  axilla.  Dissection was carried down sharply   through subcutaneous tissues and cephalic vein was identified and taken   laterally with the deltoid.  The pectoralis major was taken medially.  The   upper 1 cm of the pectoralis major was released from its attachment on   the humerus.  The clavipectoral fascia was incised just lateral to the   conjoined tendon.  This incision was carried up to but not into the   coracoacromial ligament.  Digital palpation  was used to prove   integrity of the axillary nerve which was protected throughout the   procedure.  Musculocutaneous nerve was not palpated in the operative   field.  Conjoined tendon was then retracted gently medially and the   deltoid laterally.  Anterior circumflex humeral vessels were clamped and   coagulated.  The soft tissues overlying the biceps was incised and this   incision was carried across the transverse humeral ligament to the base   of the coracoid.   At this point the biceps was encountered and was very degenerated and there is significant surrounding phlegmon.  The fluid that emerged from the joint was bloody in appearance.  There was no gross purulence. The subscapularis was taken down off of the lesser tuberosity with progressive external rotation exposing the joint.  The bone immediately adjacent to the lesser tuberosity and the joint was noted to have a hole which went directly into the humeral head and there was significant phlegmon and absence of normal bone here.  Once adequate exposure was obtained the humeral head was resected with a saw along a standard cut for shoulder arthroplasty preserving the rotator cuff which was grossly intact.  Once the humeral head was removed the metaphyseal bone was noted to have significant bone loss which was replaced with large areas of phlegmon.  This was excised and all was sent for pathology and culture.  Once all the questionable bone was removed the humerus was retracted and the glenoid was exposed.  There was a significant amount of unhealthy appearing synovium in the joint which was all carefully excised.  The glenoid was grossly intact. Copious irrigation was used with pulse lavage in the joint. At this point the humerus was again exposed and the proximal humerus was again copiously irrigated. A size 2 stem with a 43 head cement implant was constructed on the back table with 2 vials of vancomycin, 2 vials Tobra mixed in.  A K wire was  used in the central aspect of the implant for radiographic purposes and to strengthen the implant. once the implant was hardened it was placed in the proximal humerus with an excellent fit The joint was reduced. After additional copious irrigation the subscapularis was repaired using 0 PDS suture through bone tunnels. A medium Hemovac drain was passed out the anterolateral upper arm and placed deep in the joint. Skin was then closed with antibiotic 2-0 Vicryl and 2-0 nylon for skin closure in interrupted fashion. A light sterile dressing was applied. The patient was allowed to awaken from anesthesia transferred to the stretcher and taken to the recovery room in stable condition.  Postoperative plan: She will be readmitted to the medical service and we will continue to follow postoperatively.  She will likely keep her drain in place for 2 to 3 days.  If this infection can be eradicated we could consider revision to standard hemiarthroplasty in the future if possible.
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
## 2019-04-20 NOTE — Progress Notes (Addendum)
[facility] for Infectious Disease   Reason for visit: Follow up on septic arthritis, osteomyelitis, abscess of left shoulder  Interval History: Patient with continued pain in left shoulder that is severe and restricts movement. Patient denies fever, chills, vomiting. Reports mild nausea. Patient afebrile with WBC WNL. Blood culture results pending. CD4 of of 267, up from 165 one month ago after initiation of treatment. HIV viral load pending.  Patient evaluated by ortho. They plan for I&D today.  Physical Exam: Constitutional: Patient appears in no acute distress Vitals:   04/20/19 0819 04/20/19 1001  BP: (!) 119/95   Pulse: 95   Resp: 16   Temp: 98.7 F (37.1 C)   SpO2: 99% 97%   Respiratory: Normal respiratory effort; CTAB Cardiovascular: RRR, no m/r/g GI: soft, nt, nd MSK: Pain in left shoulder, tender to palpation  Review of Systems: Review of Systems  Constitutional: Negative for diaphoresis and fever.  Respiratory: Negative for cough and shortness of breath.   Cardiovascular: Negative for chest pain and leg swelling.  Gastrointestinal: Positive for nausea. Negative for abdominal pain and vomiting.  ]   Lab Results  Component Value Date   WBC 4.3 04/19/2019   HGB 10.3 (L) 04/19/2019   HCT 32.6 (L) 04/19/2019   MCV 81.9 04/19/2019   PLT 458 (H) 04/19/2019    Lab Results  Component Value Date   CREATININE 0.95 04/20/2019   BUN 6 04/20/2019   NA 137 04/20/2019   K 4.2 04/20/2019   CL 112 (H) 04/20/2019   CO2 19 (L) 04/20/2019    Lab Results  Component Value Date   ALT 11 04/19/2019   AST 18 04/19/2019   ALKPHOS 76 04/19/2019     Microbiology: Recent Results (from the past 240 hour(s))  Blood culture (routine x 2)     Status: None (Preliminary result)   Collection Time: 04/18/19  9:56 PM   Specimen: BLOOD  Result Value Ref Range Status   Specimen Description BLOOD RIGHT ANTECUBITAL  Final   Special Requests   Final    BOTTLES DRAWN AEROBIC  AND ANAEROBIC Blood Culture adequate volume Performed at [facility], [place] [address], [place], [place] [address]    Culture PENDING  Incomplete   Report Status PENDING  Incomplete  Blood culture (routine x 2)     Status: None (Preliminary result)   Collection Time: 04/18/19  9:56 PM   Specimen: BLOOD RIGHT WRIST  Result Value Ref Range Status   Specimen Description BLOOD RIGHT WRIST  Final   Special Requests   Final    BOTTLES DRAWN AEROBIC ONLY Blood Culture adequate volume Performed at [facility], [place] [address] [place], [place] [address]    Culture PENDING  Incomplete   Report Status PENDING  Incomplete  SARS Coronavirus 2 (CEPHEID - Performed in [place] hospital lab), Hosp Order     Status: None   Collection Time: 04/18/19 11:35 PM   Specimen: Nasopharyngeal Swab  Result Value Ref Range Status   SARS Coronavirus 2 NEGATIVE NEGATIVE Final    Comment: (NOTE) If result is NEGATIVE SARS-CoV-2 target nucleic acids are NOT DETECTED. The SARS-CoV-2 RNA is generally detectable in upper and lower  respiratory specimens during the acute phase of infection. The lowest  concentration of SARS-CoV-2 viral copies this assay can detect is 250  copies / mL. A negative result does not preclude SARS-CoV-2 infection  and should not be used as the sole basis for  treatment or other  patient management decisions.  A negative result may occur with  improper specimen collection / handling, submission of specimen other  than nasopharyngeal swab, presence of viral mutation(s) within the  areas targeted by this assay, and inadequate number of viral copies  (<250 copies / mL). A negative result must be combined with clinical  observations, patient history, and epidemiological information. If result is POSITIVE SARS-CoV-2 target nucleic acids are DETECTED. The SARS-CoV-2 RNA is generally detectable in upper and lower  respiratory specimens dur ing the acute phase of infection.   Positive  results are indicative of active infection with SARS-CoV-2.  Clinical  correlation with patient history and other diagnostic information is  necessary to determine patient infection status.  Positive results do  not rule out bacterial infection or co-infection with other viruses. If result is PRESUMPTIVE POSTIVE SARS-CoV-2 nucleic acids MAY BE PRESENT.   A presumptive positive result was obtained on the submitted specimen  and confirmed on repeat testing.  While 2019 novel coronavirus  (SARS-CoV-2) nucleic acids may be present in the submitted sample  additional confirmatory testing may be necessary for epidemiological  and / or clinical management purposes  to differentiate between  SARS-CoV-2 and other Sarbecovirus currently known to infect humans.  If clinically indicated additional testing with an alternate test  methodology [phone]) is advised. The SARS-CoV-2 RNA is generally  detectable in upper and lower respiratory sp ecimens during the acute  phase of infection. The expected result is Negative. Fact Sheet for Patients:  StrictlyIdeas.no Fact Sheet for Healthcare Providers: BankingDealers.co.za This test is not yet approved or cleared by the [place] FDA and has been authorized for detection and/or diagnosis of SARS-CoV-2 by FDA under an Emergency Use Authorization (EUA).  This EUA will remain in effect (meaning this test can be used) for the duration of the COVID-19 declaration under Section 564(b)(1) of the Act, 21 U.S.C. section 360bbb-3(b)(1), unless the authorization is terminated or revoked sooner. Performed at [facility], [place] [address] [place], [place] [address]   Surgical pcr screen     Status: Abnormal   Collection Time: 04/19/19  9:31 PM   Specimen: Nasal Mucosa; Nasal Swab  Result Value Ref Range Status   MRSA, PCR NEGATIVE NEGATIVE Final   Staphylococcus aureus POSITIVE (A) NEGATIVE Final     Comment: (NOTE) The Xpert SA Assay (FDA approved for NASAL specimens in patients 1 years of age and older), is one component of a comprehensive surveillance program. It is not intended to diagnose infection nor to guide or monitor treatment. Performed at [facility], [place] [address] [place], [place] [address]     Impression: MR findings indicating septic arthritis, osteomyelitis and abscess.  Plan:  1. Hold off antibiotics until after I&D by ortho and then start on broad spectrum with vancomycin and ceftriaxone 2. Will require home IV abx, hold off placing PICC until after blood cultures negative @ 48 hours

## 2019-04-20 NOTE — Progress Notes (Signed)
PROGRESS NOTE    Alicia Lynch  DUK:[id number] DOB: 1965/12/22 DOA: 04/18/2019 PCP: Lorelee Market, MD  Outpatient Specialists:     Brief Narrative:  Alicia Lynch is a 53 y.o. female with medical history significant of recent diagnosis of HIV with CD4 count less than 200, COPD, on home oxygen, GERD, hypertension, borderline diabetes, osteoarthritis who came to the ER as a follow-up of left shoulder pain.  She was seen at urgent care on June 28 with the same problem.  She had MRI of the left shoulder ordered in the outpatient setting that was not done it seems.  She came to the ER at [facility] where she was worked up including MRI of the left shoulder.  This came back now with septic arthritis.  Patient has been evaluated in the ER and orthopedics consulted.  Attempted aspiration of the joint was unsuccessful.  Ortho has recommended that with outpatient follow-up.  Case has been discussed with infectious disease who recommends admitting the patient for treatment.  She has pain at 8 out of 10 in the shoulder.  She has had fever.  Unable to move the shoulder not able to mobilize herself.  Patient will be admitted therefore with septic arthritis..  ED Course: Temperature is 98.8 blood pressure 137/97 pulse 109 respirate of 23 oxygen sats 98% on room air.  White count is 5.6 hemoglobin 9.7 platelet 413.  Sodium 139 potassium 3.1 chloride 109 CO2 22 BUN 6 creatinine 0.89 calcium 9.0.  Albumin is 2.2.  Urinalysis so far negative.  Chest x-ray showed no active disease.  MRI of the left shoulder showed findings most consistent with severe septic arthritis, septic rotator cuff disease, septic myositis and septic biceps, tendinitis.  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  04/19/2019: Patient seen.  No new complaints.  Input from infectious disease and orthopedic teams is appreciated.  For I&D tomorrow as per the orthopedic team.  Antibiotics after specimen has  been taken for cultures as per infectious disease team.  CD4 count will be checked as well.  No fever or chills.  Left shoulder pain persists.  Very limited movement of the left shoulder joint. 04/20/2019: Patient seen.  No new complaints.  For I&D of the left shoulder joint today by orthopedic team.  Patient will start antibiotics after samples are taken for cultures.  Discussed with infectious disease team.  Assessment & Plan:   Principal Problem:   Septic arthritis of shoulder, left ([place]) Active Problems:   Morbid obesity (HCC)   Chronic pain syndrome   Hypertension   Sleep apnea   AIDS (acquired immune deficiency syndrome) (HCC)   GERD (gastroesophageal reflux disease)   Hypokalemia   #1 left pyogenic arthritis of the shoulder: Patient will be admitted.  Pain control and IV fluids.  ID has been consulted.  No antibiotics will be ordered yet until joint is aspirated and cultures obtained.  We will defer any further treatment to infectious disease. 04/19/2019: For I&D tomorrow.  Likely antibiotics after samples taken for culture. 04/20/2019: For I&D today.  Patient will start antibiotics after samples were taken for cultures.  #2 hypertension:  04/19/2019: Continue to optimize.    #3 chronic pain syndrome:  Optimize pain control  #4 HIV AIDS:  Continue Biktarvy.    #5 GERD: Continue with PPIs  #6 hypokalemia:  Repeat potassium is 3.3.   Magnesium is 1.5.   We will continue to replete potassium.   We will also replete magnesium.  Continue to monitor electrolytes.    Will discontinue IV fluids. 04/20/2019: Hypokalemia has resolved.  #7 COPD:  Stable.     DVT prophylaxis: Lovenox Code Status: Full code Family Communication: None at the present Disposition Plan: Home Consults called: Infectious disease and orthopedic team.   Procedures:   For I&D of the left shoulder today.  Antimicrobials:   No antibiotics until sample has been taken for culture as per ID  team.   Subjective: No new complaints. No fever or chills.  Objective: Vitals:   04/19/19 1937 04/19/19 2040 04/20/19 0248 04/20/19 0819  BP: (!) 133/92  110/72 (!) 119/95  Pulse: 95 98 88 95  Resp: 18 15 18 16   Temp: 98.6 F (37 C)  98.5 F (36.9 C) 98.7 F (37.1 C)  TempSrc: Oral  Oral Oral  SpO2: 99% 99% 99% 99%  Weight:      Height:        Intake/Output Summary (Last 24 hours) at 04/20/2019 0823 Last data filed at 04/19/2019 1800 Gross per 24 hour  Intake 770 ml  Output --  Net 770 ml   Filed Weights   04/19/19 0011  Weight: 73.5 kg    Examination:  General exam: Appears calm and comfortable  Respiratory system: Clear to auscultation. Respiratory effort normal. Cardiovascular system: S1 & S2 heard Gastrointestinal system: Abdomen is nondistended, soft and nontender. No organomegaly or masses felt. Normal bowel sounds heard. Central nervous system: Alert and oriented. No focal neurological deficits. Extremities: Severe tenderness of the left shoulder joint on movement.  Data Reviewed: I have personally reviewed following labs and imaging studies  CBC: Recent Labs  Lab 04/18/19 1833 04/19/19 0022  WBC 5.6 4.3  NEUTROABS 1.8  --   HGB 9.7* 10.3*  HCT 30.9* 32.6*  MCV 81.3 81.9  PLT 413* 324*   Basic Metabolic Panel: Recent Labs  Lab 04/18/19 1833 04/19/19 0022 04/19/19 1124 04/20/19 0323  NA 139 140 136 137  K 3.1* 2.8* 3.3* 4.2  CL 109 108 107 112*  CO2 22 23 22  19*  GLUCOSE 87 89 105* 137*  BUN 6 5* <5* 6  CREATININE 0.89 0.86 0.78 0.95  CALCIUM 9.0 9.2 8.5* 8.4*  MG  --   --  1.5* 2.1  PHOS  --   --  3.0 2.5   GFR: Estimated Creatinine Clearance: 68.8 mL/min (by C-G formula based on SCr of 0.95 mg/dL). Liver Function Tests: Recent Labs  Lab 04/18/19 1833 04/19/19 0022 04/19/19 1124 04/20/19 0323  AST 18 18  --   --   ALT 11 11  --   --   ALKPHOS 74 76  --   --   BILITOT 0.7 0.6  --   --   PROT 9.3* 9.4*  --   --   ALBUMIN  2.2* 2.2* 1.8* 1.6*   No results for input(s): LIPASE, AMYLASE in the last 168 hours. No results for input(s): AMMONIA in the last 168 hours. Coagulation Profile: No results for input(s): INR, PROTIME in the last 168 hours. Cardiac Enzymes: No results for input(s): CKTOTAL, CKMB, CKMBINDEX, TROPONINI in the last 168 hours. BNP (last 3 results) No results for input(s): PROBNP in the last 8760 hours. HbA1C: No results for input(s): HGBA1C in the last 72 hours. CBG: No results for input(s): GLUCAP in the last 168 hours. Lipid Profile: No results for input(s): CHOL, HDL, LDLCALC, TRIG, CHOLHDL, LDLDIRECT in the last 72 hours. Thyroid Function Tests: No results for input(s): TSH,  T4TOTAL, FREET4, T3FREE, THYROIDAB in the last 72 hours. Anemia Panel: No results for input(s): VITAMINB12, FOLATE, FERRITIN, TIBC, IRON, RETICCTPCT in the last 72 hours. Urine analysis:    Component Value Date/Time   COLORURINE YELLOW 04/18/2019 1940   APPEARANCEUR CLEAR 04/18/2019 1940   LABSPEC 1.017 04/18/2019 1940   PHURINE 5.0 04/18/2019 1940   GLUCOSEU NEGATIVE 04/18/2019 1940   HGBUR MODERATE (A) 04/18/2019 1940   BILIRUBINUR NEGATIVE 04/18/2019 1940   [place] NEGATIVE 04/18/2019 1940   PROTEINUR NEGATIVE 04/18/2019 1940   NITRITE NEGATIVE 04/18/2019 1940   LEUKOCYTESUR TRACE (A) 04/18/2019 1940   Sepsis Labs: @LABRCNTIP (procalcitonin:4,lacticidven:4)  ) Recent Results (from the past 240 hour(s))  Blood culture (routine x 2)     Status: None (Preliminary result)   Collection Time: 04/18/19  9:56 PM   Specimen: BLOOD  Result Value Ref Range Status   Specimen Description BLOOD RIGHT ANTECUBITAL  Final   Special Requests   Final    BOTTLES DRAWN AEROBIC AND ANAEROBIC Blood Culture adequate volume Performed at [facility], [place] [address] [place], [place] [address]    Culture PENDING  Incomplete   Report Status PENDING  Incomplete  Blood culture (routine x 2)     Status: None  (Preliminary result)   Collection Time: 04/18/19  9:56 PM   Specimen: BLOOD RIGHT WRIST  Result Value Ref Range Status   Specimen Description BLOOD RIGHT WRIST  Final   Special Requests   Final    BOTTLES DRAWN AEROBIC ONLY Blood Culture adequate volume Performed at [facility], [place] [address], [place],  [address]    Culture PENDING  Incomplete   Report Status PENDING  Incomplete  SARS Coronavirus 2 (CEPHEID - Performed in [place] hospital lab), Hosp Order     Status: None   Collection Time: 04/18/19 11:35 PM   Specimen: Nasopharyngeal Swab  Result Value Ref Range Status   SARS Coronavirus 2 NEGATIVE NEGATIVE Final    Comment: (NOTE) If result is NEGATIVE SARS-CoV-2 target nucleic acids are NOT DETECTED. The SARS-CoV-2 RNA is generally detectable in upper and lower  respiratory specimens during the acute phase of infection. The lowest  concentration of SARS-CoV-2 viral copies this assay can detect is 250  copies / mL. A negative result does not preclude SARS-CoV-2 infection  and should not be used as the sole basis for treatment or other  patient management decisions.  A negative result may occur with  improper specimen collection / handling, submission of specimen other  than nasopharyngeal swab, presence of viral mutation(s) within the  areas targeted by this assay, and inadequate number of viral copies  (<250 copies / mL). A negative result must be combined with clinical  observations, patient history, and epidemiological information. If result is POSITIVE SARS-CoV-2 target nucleic acids are DETECTED. The SARS-CoV-2 RNA is generally detectable in upper and lower  respiratory specimens dur ing the acute phase of infection.  Positive  results are indicative of active infection with SARS-CoV-2.  Clinical  correlation with patient history and other diagnostic information is  necessary to determine patient infection status.  Positive results do  not rule out  bacterial infection or co-infection with other viruses. If result is PRESUMPTIVE POSTIVE SARS-CoV-2 nucleic acids MAY BE PRESENT.   A presumptive positive result was obtained on the submitted specimen  and confirmed on repeat testing.  While 2019 novel coronavirus  (SARS-CoV-2) nucleic acids may be present in the submitted sample  additional confirmatory  testing may be necessary for epidemiological  and / or clinical management purposes  to differentiate between  SARS-CoV-2 and other Sarbecovirus currently known to infect humans.  If clinically indicated additional testing with an alternate test  methodology [phone] is advised. The SARS-CoV-2 RNA is generally  detectable in upper and lower respiratory sp ecimens during the acute  phase of infection. The expected result is Negative. Fact Sheet for Patients:  StrictlyIdeas.no Fact Sheet for Healthcare Providers: BankingDealers.co.za This test is not yet approved or cleared by the [place] FDA and has been authorized for detection and/or diagnosis of SARS-CoV-2 by FDA under an Emergency Use Authorization (EUA).  This EUA will remain in effect (meaning this test can be used) for the duration of the COVID-19 declaration under Section 564(b)(1) of the Act, 21 U.S.C. section 360bbb-3(b)(1), unless the authorization is terminated or revoked sooner. Performed at [facility], [place] [address], [place], [place] [address]   Surgical pcr screen     Status: Abnormal   Collection Time: 04/19/19  9:31 PM   Specimen: Nasal Mucosa; Nasal Swab  Result Value Ref Range Status   MRSA, PCR NEGATIVE NEGATIVE Final   Staphylococcus aureus POSITIVE (A) NEGATIVE Final    Comment: (NOTE) The Xpert SA Assay (FDA approved for NASAL specimens in patients 65 years of age and older), is one component of a comprehensive surveillance program. It is not intended to diagnose infection nor to guide or monitor  treatment. Performed at [facility], [place] [address] [place], [place] [address]          Radiology Studies: Dg Chest 2 View  Result Date: 04/18/2019 CLINICAL DATA:  Septic shoulder. EXAM: CHEST - 2 VIEW COMPARISON:  None. FINDINGS: Heart and mediastinal contours are within normal limits. No focal opacities or effusions. No acute bony abnormality. IMPRESSION: No active cardiopulmonary disease. Electronically Signed   By: Rolm Baptise M.D.   On: 04/18/2019 19:05   Mr Shoulder Left Wo Contrast  Result Date: 04/18/2019 CLINICAL DATA:  Chronic left shoulder pain. EXAM: MRI OF THE LEFT SHOULDER WITHOUT CONTRAST TECHNIQUE: Multiplanar, multisequence MR imaging of the shoulder was performed. No intravenous contrast was administered. COMPARISON:  Radiographs 03/24/2019 FINDINGS: Rotator cuff: Severe rotator cuff tendinopathy/tendinosis which is probably septic related. No obvious full-thickness tear. Muscles: Extensive edema like signal abnormality extending back into the rotator cuff muscles likely septic myositis. Biceps long head: Markedly thickened and demonstrating diffuse fluid-like signal intensity likely septic tendinopathy with large intrasubstance tear containing fluid/pus. Acromioclavicular Joint: Intact. Type 2 acromion. No lateral downsloping. Glenohumeral Joint: MR findings highly suspicious for septic arthritis with a large complex joint effusion, severe synovitis and associated osteomyelitis involving the humerus and glenoid with bone abscesses. The articular cartilage is likely destroyed. Labrum:  Severely degenerated and torn. Bones: Extensive marrow edema and multiloculated fluid collections in the humeral head and glenoid consistent with osteomyelitis and bone abscesses. Other: Septic subacromial/subdeltoid bursitis. IMPRESSION: 1. MR findings most consistent with severe septic arthritis, septic rotator cuff disease, septic myositis and septic biceps tendinitis. 2. Osteomyelitis  involving the humeral head and glenoid with bone abscesses. These results will be called to the ordering clinician or representative by the Radiologist Assistant, and communication documented in the PACS or zVision Dashboard. Electronically Signed   By: Marijo Sanes M.D.   On: 04/18/2019 14:23        Scheduled Meds:  amLODipine  5 mg Oral Daily   bictegravir-emtricitabine-tenofovir AF  1 tablet Oral Daily  budesonide  0.25 mg Inhalation BID   buPROPion  300 mg Oral Daily   chlorhexidine  60 mL Topical Once   Chlorhexidine Gluconate Cloth  6 each Topical Daily   gabapentin  400 mg Oral BID   heparin  5,000 Units Subcutaneous Q8H   loratadine  10 mg Oral Daily   metoprolol tartrate  25 mg Oral BID   montelukast  10 mg Oral QHS   multivitamin with minerals  1 tablet Oral Q breakfast   mupirocin ointment  1 application Nasal BID   povidone-iodine  2 application Topical Once   sodium chloride flush  3 mL Intravenous Once   traZODone  50 mg Oral QHS   umeclidinium bromide  1 puff Inhalation Daily   Continuous Infusions:   ceFAZolin (ANCEF) IV       LOS: 2 days    Time spent: 57 Minutes    Dana Allan, MD  [facility] Pager #: [phone] 7PM-7AM contact night coverage as above

## 2019-04-20 NOTE — Transfer of Care (Signed)
Immediate Anesthesia Transfer of Care Note  Patient: Alicia Lynch  Procedure(s) Performed: EXCISIONAL OSTEOMYLITIS OF LEFT SHOULDER WITH ANTIBIOTIC SPACER (Left )  Patient Location: PACU  Anesthesia Type:General  Level of Consciousness: awake, alert , oriented and patient cooperative  Airway & Oxygen Therapy: Patient Spontanous Breathing and Patient connected to face mask oxygen  Post-op Assessment: Report given to RN and Post -op Vital signs reviewed and stable  Post vital signs: Reviewed and stable  Last Vitals:  Vitals Value Taken Time  BP 138/100 04/20/19 1804  Temp    Pulse 95 04/20/19 1806  Resp 24 04/20/19 1806  SpO2 95 % 04/20/19 1806  Vitals shown include unvalidated device data.  Last Pain:  Vitals:   04/20/19 0945  TempSrc:   PainSc: 6       Patients Stated Pain Goal: 3 (16/10/96 0454)  Complications: No apparent anesthesia complications

## 2019-04-21 LAB — CBC
HCT: 28.2 % — ABNORMAL LOW (ref 36.0–46.0)
Hemoglobin: 8.9 g/dL — ABNORMAL LOW (ref 12.0–15.0)
MCH: 25.8 pg — ABNORMAL LOW (ref 26.0–34.0)
MCHC: 31.6 g/dL (ref 30.0–36.0)
MCV: 81.7 fL (ref 80.0–100.0)
Platelets: 385 10*3/uL (ref 150–400)
RBC: 3.45 MIL/uL — ABNORMAL LOW (ref 3.87–5.11)
RDW: 19.2 % — ABNORMAL HIGH (ref 11.5–15.5)
WBC: 5.5 10*3/uL (ref 4.0–10.5)
nRBC: 0 % (ref 0.0–0.2)

## 2019-04-21 MED ORDER — VANCOMYCIN HCL 10 G IV SOLR
1500.0000 mg | Freq: Once | INTRAVENOUS | Status: AC
  Administered 2019-04-21: 11:00:00 1500 mg via INTRAVENOUS
  Filled 2019-04-21: qty 1500

## 2019-04-21 MED ORDER — VANCOMYCIN HCL IN DEXTROSE 750-5 MG/150ML-% IV SOLN
750.0000 mg | Freq: Two times a day (BID) | INTRAVENOUS | Status: DC
  Administered 2019-04-21 – 2019-04-23 (×4): 750 mg via INTRAVENOUS
  Filled 2019-04-21 (×5): qty 150

## 2019-04-21 MED ORDER — ADULT MULTIVITAMIN W/MINERALS CH
1.0000 | ORAL_TABLET | Freq: Every day | ORAL | Status: DC
  Administered 2019-04-22: 21:00:00 1 via ORAL
  Filled 2019-04-21: qty 1

## 2019-04-21 MED ORDER — SODIUM CHLORIDE 0.9 % IV SOLN
2.0000 g | INTRAVENOUS | Status: DC
  Administered 2019-04-21 – 2019-04-23 (×3): 2 g via INTRAVENOUS
  Filled 2019-04-21 (×3): qty 20

## 2019-04-21 NOTE — Progress Notes (Signed)
Pharmacy Antibiotic Note  Alicia Lynch is a 53 y.o. female admitted on 04/18/2019 with left shoulder septic arthritis with humeral head osteomyelitis s/p I&D, hemiarthroplasty with antibiotic cement spacer on 04/20/2019. Pharmacy has been consulted for Vancomycin dosing. Patient also placed on Ceftriaxone per MD.   Plan: Vancomycin 1500mg  IV x 1, then 750mg  IV q12h Plan for Vancomycin levels at steady state, as indicated Ceftriaxone 2g IV q24h per MD Monitor renal function, cultures, clinical course  Height: 5\' 4"  (162.6 cm) Weight: 162 lb 0.6 oz (73.5 kg) IBW/kg (Calculated) : 54.7  Temp (24hrs), Avg:97.9 F (36.6 C), Min:97.7 F (36.5 C), Max:98.4 F (36.9 C)  Recent Labs  Lab 04/18/19 1833 04/19/19 0021 04/19/19 0022 04/19/19 1124 04/20/19 0323 04/21/19 0435  WBC 5.6  --  4.3  --   --  5.5  CREATININE 0.89  --  0.86 0.78 0.95  --   LATICACIDVEN 0.5 1.1  --   --   --   --     Estimated Creatinine Clearance: 68.8 mL/min (by C-G formula based on SCr of 0.95 mg/dL).    Allergies  Allergen Reactions  . Tramadol Anaphylaxis and Swelling    Throat swells  . Motrin [Ibuprofen] Itching  . Flexeril [Cyclobenzaprine] Other (See Comments)    Causes excessive drowsiness    Antimicrobials this admission: 7/17 Cefazolin x 1 pre-op 7/18 Vancomycin >>  7/18 Ceftriaxone >>   Microbiology results: 7/15 BCx: NGTD 7/15 COVID: negative 7/16 surgical PCR: MRSA negative, Staph aureus positive 7/17 Tissue left shoulder: IP 7/17 Tissue left humeral head bone: IP 7/17 Left shoulder abscess: IP   Thank you for allowing pharmacy to be a part of this patient's care.   Lindell Spar, PharmD, BCPS Clinical Pharmacist 04/21/2019 10:03 AM

## 2019-04-21 NOTE — Anesthesia Postprocedure Evaluation (Signed)
Anesthesia Post Note  Patient: Alicia Lynch  Procedure(s) Performed: EXCISIONAL OSTEOMYLITIS OF LEFT SHOULDER WITH ANTIBIOTIC SPACER (Left )     Patient location during evaluation: PACU Anesthesia Type: General Level of consciousness: awake and alert Pain management: pain level controlled Vital Signs Assessment: post-procedure vital signs reviewed and stable Respiratory status: spontaneous breathing, nonlabored ventilation, respiratory function stable and patient connected to nasal cannula oxygen Cardiovascular status: blood pressure returned to baseline and stable Postop Assessment: no apparent nausea or vomiting Anesthetic complications: no    Last Vitals:  Vitals:   04/21/19 1224 04/21/19 1615  BP: 127/90 124/78  Pulse: 92 90  Resp: 16 18  Temp: 36.8 C 36.9 C  SpO2: 100% 100%    Last Pain:  Vitals:   04/21/19 1731  TempSrc:   PainSc: Asleep                 Vihaan Gloss

## 2019-04-21 NOTE — Progress Notes (Signed)
RN attempted to call and update pt's spouse, Nancy Nordmann, on pt status. Unsuccessful at this time. Will continue to monitor.

## 2019-04-21 NOTE — Progress Notes (Signed)
   PATIENT ID: Alicia Lynch   1 Day Post-Op Procedure(s) (LRB): EXCISIONAL OSTEOMYLITIS OF LEFT SHOULDER WITH ANTIBIOTIC SPACER (Left)  Subjective: doing well this am. Shoulder pain improving. No complaints. Denies lightheadedness/dizziness.   Objective:  Vitals:   04/21/19 0807 04/21/19 0822  BP: 118/86   Pulse: 94   Resp: 18   Temp: 98.4 F (36.9 C)   SpO2: 99% 98%     L Shoulder dressing c/d/i Wiggles fingers, distally nvi  Labs:  Recent Labs    04/18/19 1833 04/19/19 0022 04/21/19 0435  HGB 9.7* 10.3* 8.9*   Recent Labs    04/19/19 0022 04/21/19 0435  WBC 4.3 5.5  RBC 3.98 3.45*  HCT 32.6* 28.2*  PLT 458* 385   Recent Labs    04/19/19 1124 04/20/19 0323  NA 136 137  K 3.3* 4.2  CL 107 112*  CO2 22 19*  BUN <5* 6  CREATININE 0.78 0.95  GLUCOSE 105* 137*  CALCIUM 8.5* 8.4*    Assessment and Plan: 1 day s/p I&D left shoulder, hemiarthroplasty with antibiotic cement spacer abla- expected po and asymtpomatic Cultures send intraop, pending Drain output 30 cc blood, will leave in likely till Monday Continue to follow   VTE proph: per primary team

## 2019-04-21 NOTE — Plan of Care (Signed)
Problem: Education: Goal: Knowledge of General Education information will improve Description: Including pain rating scale, medication(s)/side effects and non-pharmacologic comfort measures Outcome: Progressing   Problem: Health Behavior/Discharge Planning: Goal: Ability to manage health-related needs will improve Outcome: Progressing   Problem: Clinical Measurements: Goal: Ability to maintain clinical measurements within normal limits will improve Outcome: Progressing  Goal: Cardiovascular complication will be avoided Outcome: Progressing   Problem: Activity: Goal: Risk for activity intolerance will decrease Outcome: Progressing   Problem: Nutrition: Goal: Adequate nutrition will be maintained Outcome: Progressing   Problem: Coping: Goal: Level of anxiety will decrease Outcome: Progressing   Problem: Elimination: Goal: Will not experience complications related to urinary retention Outcome: Progressing   Problem: Pain Managment: Goal: General experience of comfort will improve Outcome: Progressing   Problem: Safety: Goal: Ability to remain free from injury will improve Outcome: Progressing   Problem: Skin Integrity: Goal: Risk for impaired skin integrity will decrease Outcome: Progressing

## 2019-04-21 NOTE — Progress Notes (Signed)
PROGRESS NOTE    Alicia Lynch  HMC:[id number] DOB: 10-22-1965 DOA: 04/18/2019 PCP: Lorelee Market, MD  Outpatient Specialists:     Brief Narrative:  Alicia Lynch is a 53 y.o. female with medical history significant of recent diagnosis of HIV with CD4 count less than 200, COPD, on home oxygen, GERD, hypertension, borderline diabetes, osteoarthritis who came to the ER as a follow-up of left shoulder pain.  She was seen at urgent care on June 28 with the same problem.  She had MRI of the left shoulder ordered in the outpatient setting that was not done it seems.  She came to the ER at [facility] where she was worked up including MRI of the left shoulder.  This came back now with septic arthritis.  Patient has been evaluated in the ER and orthopedics consulted.  Attempted aspiration of the joint was unsuccessful.  Ortho has recommended that with outpatient follow-up.  Case has been discussed with infectious disease who recommends admitting the patient for treatment.  She has pain at 8 out of 10 in the shoulder.  She has had fever.  Unable to move the shoulder not able to mobilize herself.  Patient will be admitted therefore with septic arthritis..  ED Course: Temperature is 98.8 blood pressure 137/97 pulse 109 respirate of 23 oxygen sats 98% on room air.  White count is 5.6 hemoglobin 9.7 platelet 413.  Sodium 139 potassium 3.1 chloride 109 CO2 22 BUN 6 creatinine 0.89 calcium 9.0.  Albumin is 2.2.  Urinalysis so far negative.  Chest x-ray showed no active disease.  MRI of the left shoulder showed findings most consistent with severe septic arthritis, septic rotator cuff disease, septic myositis and septic biceps, tendinitis.  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  04/19/2019: Patient seen.  No new complaints.  Input from infectious disease and orthopedic teams is appreciated.  For I&D tomorrow as per the orthopedic team.  Antibiotics after specimen has  been taken for cultures as per infectious disease team.  CD4 count will be checked as well.  No fever or chills.  Left shoulder pain persists.  Very limited movement of the left shoulder joint. 04/20/2019: Patient seen.  No new complaints.  For I&D of the left shoulder joint today by orthopedic team.  Patient will start antibiotics after samples are taken for cultures.  Discussed with infectious disease team. 04/21/2019: Patient seen.  Orthopedic input is highly appreciated.  Patient underwent I&D of the septic left shoulder joint yesterday.  Will start patient on antibiotics.  Samples have been sent for cultures.   Assessment & Plan:   Principal Problem:   Septic arthritis of shoulder, left (HCC) Active Problems:   Morbid obesity (HCC)   Chronic pain syndrome   Hypertension   Sleep apnea   AIDS (acquired immune deficiency syndrome) (HCC)   GERD (gastroesophageal reflux disease)   Hypokalemia   #1 left pyogenic arthritis of the shoulder: Patient will be admitted.  Pain control and IV fluids.  ID has been consulted.  No antibiotics will be ordered yet until joint is aspirated and cultures obtained.  We will defer any further treatment to infectious disease. 04/19/2019: For I&D tomorrow.  Likely antibiotics after samples taken for culture. 04/20/2019: For I&D today.  Patient will start antibiotics after samples were taken for cultures. 04/21/2019: Follow culture results.  Start patient on antibiotics.  #2 hypertension:  04/21/2019: Continue to optimize.    #3 chronic pain syndrome:  Optimize pain control  #4  HIV AIDS:  Continue Biktarvy.    #5 GERD: Continue with PPIs  #6 hypokalemia:  Resolved.  #7 COPD:  Stable.     DVT prophylaxis: Lovenox Code Status: Full code Family Communication: None at the present Disposition Plan: Home Consults called: Infectious disease and orthopedic team.   Procedures:   For I&D of the left shoulder today.  Antimicrobials:   IV  vancomycin   IV Rocephin    Subjective: Reports left shoulder pain.   No fever or chills.  Objective: Vitals:   04/21/19 0452 04/21/19 0807 04/21/19 0822 04/21/19 1224  BP: 123/80 118/86  127/90  Pulse: 91 94  92  Resp:  18  16  Temp: 97.8 F (36.6 C) 98.4 F (36.9 C)  98.2 F (36.8 C)  TempSrc: Oral Oral  Oral  SpO2: 100% 99% 98% 100%  Weight:      Height:        Intake/Output Summary (Last 24 hours) at 04/21/2019 1329 Last data filed at 04/21/2019 0900 Gross per 24 hour  Intake 1485 ml  Output 130 ml  Net 1355 ml   Filed Weights   04/19/19 0011  Weight: 73.5 kg    Examination:  General exam: Appears calm and comfortable  Respiratory system: Clear to auscultation. Respiratory effort normal. Cardiovascular system: S1 & S2 heard Gastrointestinal system: Abdomen is nondistended, soft and nontender. No organomegaly or masses felt. Normal bowel sounds heard. Central nervous system: Alert and oriented. No focal neurological deficits. Extremities: Severe tenderness of the left shoulder joint on movement.  Data Reviewed: I have personally reviewed following labs and imaging studies  CBC: Recent Labs  Lab 04/18/19 1833 04/19/19 0022 04/21/19 0435  WBC 5.6 4.3 5.5  NEUTROABS 1.8  --   --   HGB 9.7* 10.3* 8.9*  HCT 30.9* 32.6* 28.2*  MCV 81.3 81.9 81.7  PLT 413* 458* 811   Basic Metabolic Panel: Recent Labs  Lab 04/18/19 1833 04/19/19 0022 04/19/19 1124 04/20/19 0323  NA 139 140 136 137  K 3.1* 2.8* 3.3* 4.2  CL 109 108 107 112*  CO2 22 23 22  19*  GLUCOSE 87 89 105* 137*  BUN 6 5* <5* 6  CREATININE 0.89 0.86 0.78 0.95  CALCIUM 9.0 9.2 8.5* 8.4*  MG  --   --  1.5* 2.1  PHOS  --   --  3.0 2.5   GFR: Estimated Creatinine Clearance: 68.8 mL/min (by C-G formula based on SCr of 0.95 mg/dL). Liver Function Tests: Recent Labs  Lab 04/18/19 1833 04/19/19 0022 04/19/19 1124 04/20/19 0323  AST 18 18  --   --   ALT 11 11  --   --   ALKPHOS 74 76  --    --   BILITOT 0.7 0.6  --   --   PROT 9.3* 9.4*  --   --   ALBUMIN 2.2* 2.2* 1.8* 1.6*   No results for input(s): LIPASE, AMYLASE in the last 168 hours. No results for input(s): AMMONIA in the last 168 hours. Coagulation Profile: No results for input(s): INR, PROTIME in the last 168 hours. Cardiac Enzymes: No results for input(s): CKTOTAL, CKMB, CKMBINDEX, TROPONINI in the last 168 hours. BNP (last 3 results) No results for input(s): PROBNP in the last 8760 hours. HbA1C: No results for input(s): HGBA1C in the last 72 hours. CBG: Recent Labs  Lab 04/20/19 1437 04/20/19 1807  GLUCAP 71 93   Lipid Profile: No results for input(s): CHOL, HDL, LDLCALC, TRIG, CHOLHDL, LDLDIRECT  in the last 72 hours. Thyroid Function Tests: No results for input(s): TSH, T4TOTAL, FREET4, T3FREE, THYROIDAB in the last 72 hours. Anemia Panel: No results for input(s): VITAMINB12, FOLATE, FERRITIN, TIBC, IRON, RETICCTPCT in the last 72 hours. Urine analysis:    Component Value Date/Time   COLORURINE YELLOW 04/18/2019 1940   APPEARANCEUR CLEAR 04/18/2019 1940   LABSPEC 1.017 04/18/2019 1940   PHURINE 5.0 04/18/2019 1940   GLUCOSEU NEGATIVE 04/18/2019 1940   HGBUR MODERATE (A) 04/18/2019 1940   BILIRUBINUR NEGATIVE 04/18/2019 1940   [place] NEGATIVE 04/18/2019 1940   PROTEINUR NEGATIVE 04/18/2019 1940   NITRITE NEGATIVE 04/18/2019 1940   LEUKOCYTESUR TRACE (A) 04/18/2019 1940   Sepsis Labs: @LABRCNTIP (procalcitonin:4,lacticidven:4)  ) Recent Results (from the past 240 hour(s))  Blood culture (routine x 2)     Status: None (Preliminary result)   Collection Time: 04/18/19  9:56 PM   Specimen: BLOOD  Result Value Ref Range Status   Specimen Description BLOOD RIGHT ANTECUBITAL  Final   Special Requests   Final    BOTTLES DRAWN AEROBIC AND ANAEROBIC Blood Culture adequate volume   Culture   Final    NO GROWTH 2 DAYS Performed at [facility], [place] [address] [place], [place] [address]      Report Status PENDING  Incomplete  Blood culture (routine x 2)     Status: None (Preliminary result)   Collection Time: 04/18/19  9:56 PM   Specimen: BLOOD RIGHT WRIST  Result Value Ref Range Status   Specimen Description BLOOD RIGHT WRIST  Final   Special Requests   Final    BOTTLES DRAWN AEROBIC ONLY Blood Culture adequate volume   Culture   Final    NO GROWTH 2 DAYS Performed at [facility], [place] [address], [place], [place] [address]    Report Status PENDING  Incomplete  SARS Coronavirus 2 (CEPHEID - Performed in [place] hospital lab), Hosp Order     Status: None   Collection Time: 04/18/19 11:35 PM   Specimen: Nasopharyngeal Swab  Result Value Ref Range Status   SARS Coronavirus 2 NEGATIVE NEGATIVE Final    Comment: (NOTE) If result is NEGATIVE SARS-CoV-2 target nucleic acids are NOT DETECTED. The SARS-CoV-2 RNA is generally detectable in upper and lower  respiratory specimens during the acute phase of infection. The lowest  concentration of SARS-CoV-2 viral copies this assay can detect is 250  copies / mL. A negative result does not preclude SARS-CoV-2 infection  and should not be used as the sole basis for treatment or other  patient management decisions.  A negative result may occur with  improper specimen collection / handling, submission of specimen other  than nasopharyngeal swab, presence of viral mutation(s) within the  areas targeted by this assay, and inadequate number of viral copies  (<250 copies / mL). A negative result must be combined with clinical  observations, patient history, and epidemiological information. If result is POSITIVE SARS-CoV-2 target nucleic acids are DETECTED. The SARS-CoV-2 RNA is generally detectable in upper and lower  respiratory specimens dur ing the acute phase of infection.  Positive  results are indicative of active infection with SARS-CoV-2.  Clinical  correlation with patient history and other diagnostic information  is  necessary to determine patient infection status.  Positive results do  not rule out bacterial infection or co-infection with other viruses. If result is PRESUMPTIVE POSTIVE SARS-CoV-2 nucleic acids MAY BE PRESENT.   A presumptive positive result was obtained on  the submitted specimen  and confirmed on repeat testing.  While 2019 novel coronavirus  (SARS-CoV-2) nucleic acids may be present in the submitted sample  additional confirmatory testing may be necessary for epidemiological  and / or clinical management purposes  to differentiate between  SARS-CoV-2 and other Sarbecovirus currently known to infect humans.  If clinically indicated additional testing with an alternate test  methodology [phone]) is advised. The SARS-CoV-2 RNA is generally  detectable in upper and lower respiratory sp ecimens during the acute  phase of infection. The expected result is Negative. Fact Sheet for Patients:  StrictlyIdeas.no Fact Sheet for Healthcare Providers: BankingDealers.co.za This test is not yet approved or cleared by the [place] FDA and has been authorized for detection and/or diagnosis of SARS-CoV-2 by FDA under an Emergency Use Authorization (EUA).  This EUA will remain in effect (meaning this test can be used) for the duration of the COVID-19 declaration under Section 564(b)(1) of the Act, 21 U.S.C. section 360bbb-3(b)(1), unless the authorization is terminated or revoked sooner. Performed at [facility], [place] [address], [place],  [place] [address]   Surgical pcr screen     Status: Abnormal   Collection Time: 04/19/19  9:31 PM   Specimen: Nasal Mucosa; Nasal Swab  Result Value Ref Range Status   MRSA, PCR NEGATIVE NEGATIVE Final   Staphylococcus aureus POSITIVE (A) NEGATIVE Final    Comment: (NOTE) The Xpert SA Assay (FDA approved for NASAL specimens in patients 34 years of age and older), is one component of a  comprehensive surveillance program. It is not intended to diagnose infection nor to guide or monitor treatment. Performed at [facility], [place] [address] [place], [place] [address]   Aerobic/Anaerobic Culture (surgical/deep wound)     Status: None (Preliminary result)   Collection Time: 04/20/19  5:20 PM   Specimen: Bone; Tissue  Result Value Ref Range Status   Specimen Description TISSUE LEFT HUMERAL HEAD BONE  Final   Special Requests PATIENT ON FOLLOWING ANCEF  Final   Gram Stain   Final    FEW WBC PRESENT, PREDOMINANTLY MONONUCLEAR NO ORGANISMS SEEN    Culture   Final    NO GROWTH < 24 HOURS Performed at [facility], [place] [address], [place], [place] [address]    Report Status PENDING  Incomplete  Aerobic/Anaerobic Culture (surgical/deep wound)     Status: None (Preliminary result)   Collection Time: 04/20/19  5:21 PM   Specimen: Joint, Other; Tissue  Result Value Ref Range Status   Specimen Description TISSUE LEFT SHOULDER  Final   Special Requests PATIENT ON FOLLOWING ANCEF  Final   Gram Stain   Final    ABUNDANT WBC PRESENT,BOTH PMN AND MONONUCLEAR NO ORGANISMS SEEN    Culture   Final    NO GROWTH < 24 HOURS Performed at [facility], [place] [address] [place], [place] [address]    Report Status PENDING  Incomplete  Aerobic/Anaerobic Culture (surgical/deep wound)     Status: None (Preliminary result)   Collection Time: 04/20/19  5:22 PM   Specimen: Joint, Other; Body Fluid  Result Value Ref Range Status   Specimen Description ABSCESS LEFT SHOULDER  Final   Special Requests PATIENT ON FOLLOWING ANCEF  Final   Gram Stain   Final    ABUNDANT WBC PRESENT,BOTH PMN AND MONONUCLEAR NO ORGANISMS SEEN    Culture   Final    NO GROWTH < 24 HOURS Performed at [facility], [place] [address] [place], [place] [address]    Report Status PENDING  Incomplete         Radiology Studies: Dg Shoulder Left Port  Result Date: 04/20/2019 CLINICAL DATA:   Post shoulder surgery EXAM: LEFT SHOULDER - 1 VIEW COMPARISON:  Shoulder MRI 04/18/2019, shoulder radiograph 03/24/2019 FINDINGS: Patient is now post resection shaft of the left humeral head with left shoulder hemiarthroplasty. A left humeral head prosthesis is in place transfixed by metallic pin. Surgical drains are noted. Normal expected alignment. No other bony destructive changes. No unexpected foreign body. IMPRESSION: Expected postoperative appearance without acute postoperative complication. Electronically Signed   By: Lovena Le M.D.   On: 04/20/2019 20:42        Scheduled Meds:  amLODipine  5 mg Oral Daily   bictegravir-emtricitabine-tenofovir AF  1 tablet Oral Daily   budesonide  0.25 mg Inhalation BID   buPROPion  300 mg Oral Daily   Chlorhexidine Gluconate Cloth  6 each Topical Daily   docusate sodium  100 mg Oral BID   gabapentin  400 mg Oral BID   heparin  5,000 Units Subcutaneous Q8H   loratadine  10 mg Oral Daily   metoprolol tartrate  25 mg Oral BID   montelukast  10 mg Oral QHS   [START ON 04/22/2019] multivitamin with minerals  1 tablet Oral Daily   mupirocin ointment  1 application Nasal BID   sodium chloride flush  3 mL Intravenous Once   traZODone  50 mg Oral QHS   umeclidinium bromide  1 puff Inhalation Daily   Continuous Infusions:  sodium chloride 100 mL/hr at 04/21/19 0512   cefTRIAXone (ROCEPHIN)  IV 2 g (04/21/19 1004)   lactated ringers 10 mL/hr at 04/20/19 1500   methocarbamol (ROBAXIN) IV     vancomycin       LOS: 3 days    Time spent: 18 Minutes    Dana Allan, MD  [facility] Pager #: [phone] 7PM-7AM contact night coverage as above

## 2019-04-21 NOTE — Plan of Care (Signed)
  Problem: Education: Goal: Knowledge of General Education information will improve Description: Including pain rating scale, medication(s)/side effects and non-pharmacologic comfort measures Outcome: Progressing   Problem: Pain Managment: Goal: General experience of comfort will improve Outcome: Progressing   Problem: Clinical Measurements: Goal: Will remain free from infection Outcome: Progressing   Problem: Safety: Goal: Ability to remain free from injury will improve Outcome: Progressing

## 2019-04-22 LAB — CREATININE, SERUM
Creatinine, Ser: 0.7 mg/dL (ref 0.44–1.00)
GFR calc Af Amer: >60 mL/min (ref >60)
GFR calc non Af Amer: >60 mL/min (ref >60)

## 2019-04-22 NOTE — Progress Notes (Signed)
PROGRESS NOTE    Alicia Lynch  MEQ:[id number] DOB: 1966/05/01 DOA: 04/18/2019 PCP: Lorelee Market, MD  Outpatient Specialists:     Brief Narrative:  Alicia Lynch is a 53 y.o. female with medical history significant of recent diagnosis of HIV with CD4 count less than 200, COPD, on home oxygen, GERD, hypertension, borderline diabetes and osteoarthritis.  Patient was admitted with septic arthritis of left shoulder joint as well as osteomyelitis.  Patient underwent I&D of the left shoulder, hemiarthroplasty with antibiotic cement spacer placement.  Specimen was sent for cultures by the orthopedic team at the time of I&D.  Final culture results are still pending.  Patient is currently on IV vancomycin and Rocephin as per infectious disease team recommendation.  Further management depend on hospital course.    Assessment & Plan:   Principal Problem:   Septic arthritis of shoulder, left (HCC) Active Problems:   Morbid obesity (HCC)   Chronic pain syndrome   Hypertension   Sleep apnea   AIDS (acquired immune deficiency syndrome) (HCC)   GERD (gastroesophageal reflux disease)   Hypokalemia   #1 left pyogenic arthritis of the shoulder: Patient is status post I&D, hemiarthroplasty and antibiotic cement spacer placement.   Cultures are pending.   Patient is currently on IV vancomycin and Rocephin.    #2 hypertension:  Controlled.   Continue current medication.      #3 chronic pain syndrome:  Optimize pain control  #4 HIV AIDS:  Continue Biktarvy.    #5 GERD: Continue with PPIs  #6 hypokalemia:  Resolved.  #7 COPD:  Stable.     DVT prophylaxis: Lovenox Code Status: Full code Family Communication: None at the present Disposition Plan: Home Consults called: Infectious disease and orthopedic team.   Procedures:   For I&D of the left shoulder today.  Antimicrobials:   IV vancomycin   IV Rocephin    Subjective: Reports some improvement in left  shoulder pain. Mild fever noted (T-max of 100.6 F)  Objective: Vitals:   04/22/19 0756 04/22/19 0911 04/22/19 1243 04/22/19 1552  BP:  (!) 131/104 109/80 (!) 135/93  Pulse:  (!) 107 99 (!) 103  Resp:  18 18 18   Temp:  98.5 F (36.9 C) 98.4 F (36.9 C) (!) 100.6 F (38.1 C)  TempSrc:  Oral Oral Oral  SpO2: 98% 99% 99% 98%  Weight:      Height:        Intake/Output Summary (Last 24 hours) at 04/22/2019 1609 Last data filed at 04/22/2019 1514 Gross per 24 hour  Intake 2693.18 ml  Output 30 ml  Net 2663.18 ml   Filed Weights   04/19/19 0011  Weight: 73.5 kg    Examination:  General exam: Appears calm and comfortable  Respiratory system: Clear to auscultation. Respiratory effort normal. Cardiovascular system: S1 & S2 heard Gastrointestinal system: Abdomen is nondistended, soft and nontender. No organomegaly or masses felt. Normal bowel sounds heard. Central nervous system: Alert and oriented. No focal neurological deficits. Extremities: Severe tenderness of the left shoulder joint on movement.  Data Reviewed: I have personally reviewed following labs and imaging studies  CBC: Recent Labs  Lab 04/18/19 1833 04/19/19 0022 04/21/19 0435  WBC 5.6 4.3 5.5  NEUTROABS 1.8  --   --   HGB 9.7* 10.3* 8.9*  HCT 30.9* 32.6* 28.2*  MCV 81.3 81.9 81.7  PLT 413* 458* 297   Basic Metabolic Panel: Recent Labs  Lab 04/18/19 1833 04/19/19 0022 04/19/19 1124 04/20/19 0323 04/22/19 0404  NA 139 140 136 137  --   K 3.1* 2.8* 3.3* 4.2  --   CL 109 108 107 112*  --   CO2 22 23 22  19*  --   GLUCOSE 87 89 105* 137*  --   BUN 6 5* <5* 6  --   CREATININE 0.89 0.86 0.78 0.95 0.70  CALCIUM 9.0 9.2 8.5* 8.4*  --   MG  --   --  1.5* 2.1  --   PHOS  --   --  3.0 2.5  --    GFR: Estimated Creatinine Clearance: 81.7 mL/min (by C-G formula based on SCr of 0.7 mg/dL). Liver Function Tests: Recent Labs  Lab 04/18/19 1833 04/19/19 0022 04/19/19 1124 04/20/19 0323  AST 18 18  --    --   ALT 11 11  --   --   ALKPHOS 74 76  --   --   BILITOT 0.7 0.6  --   --   PROT 9.3* 9.4*  --   --   ALBUMIN 2.2* 2.2* 1.8* 1.6*   No results for input(s): LIPASE, AMYLASE in the last 168 hours. No results for input(s): AMMONIA in the last 168 hours. Coagulation Profile: No results for input(s): INR, PROTIME in the last 168 hours. Cardiac Enzymes: No results for input(s): CKTOTAL, CKMB, CKMBINDEX, TROPONINI in the last 168 hours. BNP (last 3 results) No results for input(s): PROBNP in the last 8760 hours. HbA1C: No results for input(s): HGBA1C in the last 72 hours. CBG: Recent Labs  Lab 04/20/19 1437 04/20/19 1807  GLUCAP 71 93   Lipid Profile: No results for input(s): CHOL, HDL, LDLCALC, TRIG, CHOLHDL, LDLDIRECT in the last 72 hours. Thyroid Function Tests: No results for input(s): TSH, T4TOTAL, FREET4, T3FREE, THYROIDAB in the last 72 hours. Anemia Panel: No results for input(s): VITAMINB12, FOLATE, FERRITIN, TIBC, IRON, RETICCTPCT in the last 72 hours. Urine analysis:    Component Value Date/Time   COLORURINE YELLOW 04/18/2019 1940   APPEARANCEUR CLEAR 04/18/2019 1940   LABSPEC 1.017 04/18/2019 1940   PHURINE 5.0 04/18/2019 1940   GLUCOSEU NEGATIVE 04/18/2019 1940   HGBUR MODERATE (A) 04/18/2019 1940   BILIRUBINUR NEGATIVE 04/18/2019 1940   [place] NEGATIVE 04/18/2019 1940   PROTEINUR NEGATIVE 04/18/2019 1940   NITRITE NEGATIVE 04/18/2019 1940   LEUKOCYTESUR TRACE (A) 04/18/2019 1940   Sepsis Labs: @LABRCNTIP (procalcitonin:4,lacticidven:4)  ) Recent Results (from the past 240 hour(s))  Blood culture (routine x 2)     Status: None (Preliminary result)   Collection Time: 04/18/19  9:56 PM   Specimen: BLOOD  Result Value Ref Range Status   Specimen Description BLOOD RIGHT ANTECUBITAL  Final   Special Requests   Final    BOTTLES DRAWN AEROBIC AND ANAEROBIC Blood Culture adequate volume   Culture   Final    NO GROWTH 3 DAYS Performed at [facility], [place] [address] [place], [place] [address]    Report Status PENDING  Incomplete  Blood culture (routine x 2)     Status: None (Preliminary result)   Collection Time: 04/18/19  9:56 PM   Specimen: BLOOD RIGHT WRIST  Result Value Ref Range Status   Specimen Description BLOOD RIGHT WRIST  Final   Special Requests   Final    BOTTLES DRAWN AEROBIC ONLY Blood Culture adequate volume   Culture   Final    NO GROWTH 3 DAYS Performed at [facility], [place] [address], [place], [place] [address]  Report Status PENDING  Incomplete  SARS Coronavirus 2 (CEPHEID - Performed in [place] hospital lab), Hosp Order     Status: None   Collection Time: 04/18/19 11:35 PM   Specimen: Nasopharyngeal Swab  Result Value Ref Range Status   SARS Coronavirus 2 NEGATIVE NEGATIVE Final    Comment: (NOTE) If result is NEGATIVE SARS-CoV-2 target nucleic acids are NOT DETECTED. The SARS-CoV-2 RNA is generally detectable in upper and lower  respiratory specimens during the acute phase of infection. The lowest  concentration of SARS-CoV-2 viral copies this assay can detect is 250  copies / mL. A negative result does not preclude SARS-CoV-2 infection  and should not be used as the sole basis for treatment or other  patient management decisions.  A negative result may occur with  improper specimen collection / handling, submission of specimen other  than nasopharyngeal swab, presence of viral mutation(s) within the  areas targeted by this assay, and inadequate number of viral copies  (<250 copies / mL). A negative result must be combined with clinical  observations, patient history, and epidemiological information. If result is POSITIVE SARS-CoV-2 target nucleic acids are DETECTED. The SARS-CoV-2 RNA is generally detectable in upper and lower  respiratory specimens dur ing the acute phase of infection.  Positive  results are indicative of active infection with SARS-CoV-2.  Clinical  correlation  with patient history and other diagnostic information is  necessary to determine patient infection status.  Positive results do  not rule out bacterial infection or co-infection with other viruses. If result is PRESUMPTIVE POSTIVE SARS-CoV-2 nucleic acids MAY BE PRESENT.   A presumptive positive result was obtained on the submitted specimen  and confirmed on repeat testing.  While 2019 novel coronavirus  (SARS-CoV-2) nucleic acids may be present in the submitted sample  additional confirmatory testing may be necessary for epidemiological  and / or clinical management purposes  to differentiate between  SARS-CoV-2 and other Sarbecovirus currently known to infect humans.  If clinically indicated additional testing with an alternate test  methodology [phone]) is advised. The SARS-CoV-2 RNA is generally  detectable in upper and lower respiratory sp ecimens during the acute  phase of infection. The expected result is Negative. Fact Sheet for Patients:  StrictlyIdeas.no Fact Sheet for Healthcare Providers: BankingDealers.co.za This test is not yet approved or cleared by the [place] FDA and has been authorized for detection and/or diagnosis of SARS-CoV-2 by FDA under an Emergency Use Authorization (EUA).  This EUA will remain in effect (meaning this test can be used) for the duration of the COVID-19 declaration under Section 564(b)(1) of the Act, 21 U.S.C. section 360bbb-3(b)(1), unless the authorization is terminated or revoked sooner. Performed at [facility], [place] [address], [place], [place] [address]   Surgical pcr screen     Status: Abnormal   Collection Time: 04/19/19  9:31 PM   Specimen: Nasal Mucosa; Nasal Swab  Result Value Ref Range Status   MRSA, PCR NEGATIVE NEGATIVE Final   Staphylococcus aureus POSITIVE (A) NEGATIVE Final    Comment: (NOTE) The Xpert SA Assay (FDA approved for NASAL specimens in patients 85 years  of age and older), is one component of a comprehensive surveillance program. It is not intended to diagnose infection nor to guide or monitor treatment. Performed at [facility], [place] [address] [place], [place] [address]   Aerobic/Anaerobic Culture (surgical/deep wound)     Status: None (Preliminary result)   Collection Time: 04/20/19  5:20  PM   Specimen: Bone; Tissue  Result Value Ref Range Status   Specimen Description TISSUE LEFT HUMERAL HEAD BONE  Final   Special Requests PATIENT ON FOLLOWING ANCEF  Final   Gram Stain   Final    FEW WBC PRESENT, PREDOMINANTLY MONONUCLEAR NO ORGANISMS SEEN    Culture   Final    NO GROWTH 2 DAYS Performed at [facility], [address], [place], [place] [address]    Report Status PENDING  Incomplete  Aerobic/Anaerobic Culture (surgical/deep wound)     Status: None (Preliminary result)   Collection Time: 04/20/19  5:21 PM   Specimen: Joint, Other; Tissue  Result Value Ref Range Status   Specimen Description TISSUE LEFT SHOULDER  Final   Special Requests PATIENT ON FOLLOWING ANCEF  Final   Gram Stain   Final    ABUNDANT WBC PRESENT,BOTH PMN AND MONONUCLEAR NO ORGANISMS SEEN    Culture   Final    NO GROWTH 2 DAYS Performed at [facility], [place] [address] [place], [place] [address]    Report Status PENDING  Incomplete  Aerobic/Anaerobic Culture (surgical/deep wound)     Status: None (Preliminary result)   Collection Time: 04/20/19  5:22 PM   Specimen: Joint, Other; Body Fluid  Result Value Ref Range Status   Specimen Description ABSCESS LEFT SHOULDER  Final   Special Requests PATIENT ON FOLLOWING ANCEF  Final   Gram Stain   Final    ABUNDANT WBC PRESENT,BOTH PMN AND MONONUCLEAR NO ORGANISMS SEEN    Culture   Final    NO GROWTH 2 DAYS Performed at [facility], [address] [place], [place] [address]    Report Status PENDING  Incomplete         Radiology Studies: Dg Shoulder Left Port  Result  Date: 04/20/2019 CLINICAL DATA:  Post shoulder surgery EXAM: LEFT SHOULDER - 1 VIEW COMPARISON:  Shoulder MRI 04/18/2019, shoulder radiograph 03/24/2019 FINDINGS: Patient is now post resection shaft of the left humeral head with left shoulder hemiarthroplasty. A left humeral head prosthesis is in place transfixed by metallic pin. Surgical drains are noted. Normal expected alignment. No other bony destructive changes. No unexpected foreign body. IMPRESSION: Expected postoperative appearance without acute postoperative complication. Electronically Signed   By: Lovena Le M.D.   On: 04/20/2019 20:42        Scheduled Meds: . amLODipine  5 mg Oral Daily  . bictegravir-emtricitabine-tenofovir AF  1 tablet Oral Daily  . budesonide  0.25 mg Inhalation BID  . buPROPion  300 mg Oral Daily  . Chlorhexidine Gluconate Cloth  6 each Topical Daily  . docusate sodium  100 mg Oral BID  . gabapentin  400 mg Oral BID  . heparin  5,000 Units Subcutaneous Q8H  . loratadine  10 mg Oral Daily  . metoprolol tartrate  25 mg Oral BID  . montelukast  10 mg Oral QHS  . multivitamin with minerals  1 tablet Oral Daily  . mupirocin ointment  1 application Nasal BID  . sodium chloride flush  3 mL Intravenous Once  . traZODone  50 mg Oral QHS  . umeclidinium bromide  1 puff Inhalation Daily   Continuous Infusions: . sodium chloride Stopped (04/22/19 1146)  . cefTRIAXone (ROCEPHIN)  IV Stopped (04/22/19 1017)  . lactated ringers 10 mL/hr at 04/20/19 1500  . methocarbamol (ROBAXIN) IV    . vancomycin Stopped (04/22/19 1139)     LOS: 4 days  Time spent: 65 Minutes    Dana Allan, MD  [facility] Pager #: [phone] 7PM-7AM contact night coverage as above

## 2019-04-23 ENCOUNTER — Inpatient Hospital Stay: Payer: Self-pay

## 2019-04-23 ENCOUNTER — Other Ambulatory Visit: Payer: Self-pay | Admitting: Orthopedic Surgery

## 2019-04-23 ENCOUNTER — Inpatient Hospital Stay (HOSPITAL_COMMUNITY): Payer: Medicaid Other

## 2019-04-23 ENCOUNTER — Encounter (HOSPITAL_COMMUNITY): Payer: Self-pay | Admitting: Orthopedic Surgery

## 2019-04-23 DIAGNOSIS — E119 Type 2 diabetes mellitus without complications: Secondary | ICD-10-CM

## 2019-04-23 DIAGNOSIS — G894 Chronic pain syndrome: Secondary | ICD-10-CM

## 2019-04-23 DIAGNOSIS — B20 Human immunodeficiency virus [HIV] disease: Secondary | ICD-10-CM

## 2019-04-23 LAB — ACID FAST SMEAR (AFB, MYCOBACTERIA)
Acid Fast Smear: NEGATIVE
Acid Fast Smear: NEGATIVE
Acid Fast Smear: NEGATIVE

## 2019-04-23 LAB — SEDIMENTATION RATE: Sed Rate: 128 mm/hr — ABNORMAL HIGH (ref 0–22)

## 2019-04-23 LAB — C-REACTIVE PROTEIN: CRP: 3.2 mg/dL — ABNORMAL HIGH (ref <1.0)

## 2019-04-23 LAB — VANCOMYCIN, RANDOM: Vancomycin Rm: 21

## 2019-04-23 MED ORDER — CEFTRIAXONE IV (FOR PTA / DISCHARGE USE ONLY): 2.0000 g | INTRAVENOUS | 0 refills | Status: AC

## 2019-04-23 MED ORDER — OXYCODONE-ACETAMINOPHEN 5-325 MG PO TABS: 1.0000 | ORAL_TABLET | ORAL | 0 refills | Status: DC | PRN

## 2019-04-23 MED ORDER — VANCOMYCIN IV (FOR PTA / DISCHARGE USE ONLY): 750.0000 mg | Freq: Two times a day (BID) | INTRAVENOUS | 0 refills | Status: AC

## 2019-04-23 MED ORDER — SODIUM CHLORIDE 0.9% FLUSH: 10.0000 mL | INTRAVENOUS | Status: DC | PRN

## 2019-04-23 NOTE — TOC Progression Note (Signed)
Transition of Care [facility]) - Progression Note    Patient Details  Name: Alicia Lynch MRN: [id number] Date of Birth: 1966/10/01  Transition of Care [facility]) CM/SW Contact  Jacalyn Lefevre Edson Snowball, RN Phone Number: 04/23/2019, 4:06 PM  Clinical Narrative:     Damaris Schooner to patient via telephone. Confirmed face sheet information. Patient from home with husband.  Explained HHRN will not be there everytime a dose is due, patient and husband will be taught how to administrator antibiotics, patient voiced understanding.   Pam with [employer] will provide teaching this afternoon.   Expected Discharge Plan: [place] Barriers to Discharge: No Barriers Identified  Expected Discharge Plan and Services Expected Discharge Plan: [place]   Discharge Planning Services: CM Consult Post Acute Care Choice: [place] arrangements for the past 2 months: Single Family Home Expected Discharge Date: 04/23/19               DME Arranged: N/A         HH Arranged: RN HH Agency: Personnel officer)         Social Determinants of Health (SDOH) Interventions    Readmission Risk Interventions No flowsheet data found.

## 2019-04-23 NOTE — Progress Notes (Signed)
[facility] for Infectious Disease   Reason for visit: Follow up on septic arthritis, osteomyelitis of left shoulder  Interval History: On 7/17, ortho performed left shoulder arthrotomy with open debridement and debridement/resection of bone with osteomyelitis including the humeral head. Intraoperative cultures were sent.   Subjective: Patient reports that she is doing very well this morning. Patient reports pain in her shoulder from the procedure. She denies fever, chills, nausea, vomiting.  Physical Exam: Constitutional: NAD Vitals:   04/23/19 0755 04/23/19 0811  BP: 119/88   Pulse: 99   Resp: 18   Temp: 99.2 F (37.3 C)   SpO2: 99% 99%  Respiratory: Normal respiratory effort; CTAB Cardiovascular: RRR, no m/r/g GI: soft, nt, nd  Review of Systems: Review of Systems  Constitutional: Negative for chills and fever.  Respiratory: Negative for shortness of breath.   Cardiovascular: Negative for chest pain.  Gastrointestinal: Negative for nausea and vomiting.    Lab Results  Component Value Date   WBC 5.5 04/21/2019   HGB 8.9 (L) 04/21/2019   HCT 28.2 (L) 04/21/2019   MCV 81.7 04/21/2019   PLT 385 04/21/2019    Lab Results  Component Value Date   CREATININE 0.70 04/22/2019   BUN 6 04/20/2019   NA 137 04/20/2019   K 4.2 04/20/2019   CL 112 (H) 04/20/2019   CO2 19 (L) 04/20/2019    Lab Results  Component Value Date   ALT 11 04/19/2019   AST 18 04/19/2019   ALKPHOS 76 04/19/2019     Microbiology: Recent Results (from the past 240 hour(s))  Blood culture (routine x 2)     Status: None (Preliminary result)   Collection Time: 04/18/19  9:56 PM   Specimen: BLOOD  Result Value Ref Range Status   Specimen Description BLOOD RIGHT ANTECUBITAL  Final   Special Requests   Final    BOTTLES DRAWN AEROBIC AND ANAEROBIC Blood Culture adequate volume   Culture   Final    NO GROWTH 4 DAYS Performed at [facility], [address] [place], [place] [address]   Report Status PENDING  Incomplete  Blood culture (routine x 2)     Status: None (Preliminary result)   Collection Time: 04/18/19  9:56 PM   Specimen: BLOOD RIGHT WRIST  Result Value Ref Range Status   Specimen Description BLOOD RIGHT WRIST  Final   Special Requests   Final    BOTTLES DRAWN AEROBIC ONLY Blood Culture adequate volume   Culture   Final    NO GROWTH 4 DAYS Performed at [facility], [place] [address], [place], [place] [address]    Report Status PENDING  Incomplete  SARS Coronavirus 2 (CEPHEID - Performed in [place] hospital lab), Hosp Order     Status: None   Collection Time: 04/18/19 11:35 PM   Specimen: Nasopharyngeal Swab  Result Value Ref Range Status   SARS Coronavirus 2 NEGATIVE NEGATIVE Final    Comment: (NOTE) If result is NEGATIVE SARS-CoV-2 target nucleic acids are NOT DETECTED. The SARS-CoV-2 RNA is generally detectable in upper and lower  respiratory specimens during the acute phase of infection. The lowest  concentration of SARS-CoV-2 viral copies this assay can detect is 250  copies / mL. A negative result does not preclude SARS-CoV-2 infection  and should not be used as the sole basis for treatment or other  patient management decisions.  A negative result may occur with  improper specimen collection / handling, submission  of specimen other  than nasopharyngeal swab, presence of viral mutation(s) within the  areas targeted by this assay, and inadequate number of viral copies  (<250 copies / mL). A negative result must be combined with clinical  observations, patient history, and epidemiological information. If result is POSITIVE SARS-CoV-2 target nucleic acids are DETECTED. The SARS-CoV-2 RNA is generally detectable in upper and lower  respiratory specimens dur ing the acute phase of infection.  Positive  results are indicative of active infection with SARS-CoV-2.  Clinical  correlation with patient history and other diagnostic information is   necessary to determine patient infection status.  Positive results do  not rule out bacterial infection or co-infection with other viruses. If result is PRESUMPTIVE POSTIVE SARS-CoV-2 nucleic acids MAY BE PRESENT.   A presumptive positive result was obtained on the submitted specimen  and confirmed on repeat testing.  While 2019 novel coronavirus  (SARS-CoV-2) nucleic acids may be present in the submitted sample  additional confirmatory testing may be necessary for epidemiological  and / or clinical management purposes  to differentiate between  SARS-CoV-2 and other Sarbecovirus currently known to infect humans.  If clinically indicated additional testing with an alternate test  methodology [phone]) is advised. The SARS-CoV-2 RNA is generally  detectable in upper and lower respiratory sp ecimens during the acute  phase of infection. The expected result is Negative. Fact Sheet for Patients:  StrictlyIdeas.no Fact Sheet for Healthcare Providers: BankingDealers.co.za This test is not yet approved or cleared by the [place] FDA and has been authorized for detection and/or diagnosis of SARS-CoV-2 by FDA under an Emergency Use Authorization (EUA).  This EUA will remain in effect (meaning this test can be used) for the duration of the COVID-19 declaration under Section 564(b)(1) of the Act, 21 U.S.C. section 360bbb-3(b)(1), unless the authorization is terminated or revoked sooner. Performed at [facility], [place] [address], [place], [place] [address]   Surgical pcr screen     Status: Abnormal   Collection Time: 04/19/19  9:31 PM   Specimen: Nasal Mucosa; Nasal Swab  Result Value Ref Range Status   MRSA, PCR NEGATIVE NEGATIVE Final   Staphylococcus aureus POSITIVE (A) NEGATIVE Final    Comment: (NOTE) The Xpert SA Assay (FDA approved for NASAL specimens in patients 27 years of age and older), is one component of a comprehensive  surveillance program. It is not intended to diagnose infection nor to guide or monitor treatment. Performed at [facility], [place] [address] [place], [place] [address]   Aerobic/Anaerobic Culture (surgical/deep wound)     Status: None (Preliminary result)   Collection Time: 04/20/19  5:20 PM   Specimen: Bone; Tissue  Result Value Ref Range Status   Specimen Description TISSUE LEFT HUMERAL HEAD BONE  Final   Special Requests PATIENT ON FOLLOWING ANCEF  Final   Gram Stain   Final    FEW WBC PRESENT, PREDOMINANTLY MONONUCLEAR NO ORGANISMS SEEN    Culture   Final    NO GROWTH 3 DAYS NO ANAEROBES ISOLATED; CULTURE IN PROGRESS FOR 5 DAYS Performed at [facility], [place] [address] [place], [place] [address]    Report Status PENDING  Incomplete  Aerobic/Anaerobic Culture (surgical/deep wound)     Status: None (Preliminary result)   Collection Time: 04/20/19  5:21 PM   Specimen: Joint, Other; Tissue  Result Value Ref Range Status   Specimen Description TISSUE LEFT SHOULDER  Final   Special Requests PATIENT ON FOLLOWING  ANCEF  Final   Gram Stain   Final    ABUNDANT WBC PRESENT,BOTH PMN AND MONONUCLEAR NO ORGANISMS SEEN    Culture   Final    NO GROWTH 3 DAYS NO ANAEROBES ISOLATED; CULTURE IN PROGRESS FOR 5 DAYS Performed at [facility], [place] [address] [place], [place] [address]    Report Status PENDING  Incomplete  Aerobic/Anaerobic Culture (surgical/deep wound)     Status: None (Preliminary result)   Collection Time: 04/20/19  5:22 PM   Specimen: Joint, Other; Body Fluid  Result Value Ref Range Status   Specimen Description ABSCESS LEFT SHOULDER  Final   Special Requests PATIENT ON FOLLOWING ANCEF  Final   Gram Stain   Final    ABUNDANT WBC PRESENT,BOTH PMN AND MONONUCLEAR NO ORGANISMS SEEN    Culture   Final    NO GROWTH 3 DAYS NO ANAEROBES ISOLATED; CULTURE IN PROGRESS FOR 5 DAYS Performed at [facility], [place] [address] [place], [place] [address]     Report Status PENDING  Incomplete    Impression: Alicia Lynch is a 53 yo female with PMH of HIV, diabetes, and osteoarthritis who was found to have septic arthritis and osteomyelitis and is currently s/p I&D with resection by ortho.  Plan: 1. Will need PICC line placed for outpatient antibiotics 2. Continue on broad spectrum with vanc+recoephin pending identification of agent from surgical pathology 3. Will order ESR/CRP to establish baseline

## 2019-04-23 NOTE — Plan of Care (Signed)

## 2019-04-23 NOTE — Plan of Care (Signed)
  Problem: Pain Managment: Goal: General experience of comfort will improve Outcome: Progressing   Problem: Safety: Goal: Ability to remain free from injury will improve Outcome: Progressing   Problem: Skin Integrity: Goal: Risk for impaired skin integrity will decrease Outcome: Progressing   

## 2019-04-23 NOTE — Progress Notes (Signed)
   PATIENT ID: Alicia Lynch   3 Days Post-Op Procedure(s) (LRB): EXCISIONAL OSTEOMYLITIS OF LEFT SHOULDER WITH ANTIBIOTIC SPACER (Left)  Subjective: Reports doing well w left shoulder, discomfort but tolerable. No other complaints.   Objective:  Vitals:   04/23/19 0755 04/23/19 0811  BP: 119/88   Pulse: 99   Resp: 18   Temp: 99.2 F (37.3 C)   SpO2: 99% 99%     L shoulder dressing c/d/i Wiggles fingers, distally NVI  Labs:  Recent Labs    04/21/19 0435  HGB 8.9*   Recent Labs    04/21/19 0435  WBC 5.5  RBC 3.45*  HCT 28.2*  PLT 385   Recent Labs    04/22/19 0404  CREATININE 0.70    Assessment and Plan: 3 days s/p I&D left shoulder, hemiarthroplasty with antibiotic cement spacer abla- expected po and asymtpomatic Cultures send intraop, pending On IV vanc and rocephin per primary team Drain pulled today Follow up with Dr. Tamera Punt in 10 days  VTE proph: per primary team

## 2019-04-23 NOTE — Progress Notes (Signed)
Peripherally Inserted Central Catheter/Midline Placement  The IV Nurse has discussed with the patient and/or persons authorized to consent for the patient, the purpose of this procedure and the potential benefits and risks involved with this procedure.  The benefits include less needle sticks, lab draws from the catheter, and the patient may be discharged home with the catheter. Risks include, but not limited to, infection, bleeding, blood clot (thrombus formation), and puncture of an artery; nerve damage and irregular heartbeat and possibility to perform a PICC exchange if needed/ordered by physician.  Alternatives to this procedure were also discussed.  Bard Power PICC patient education guide, fact sheet on infection prevention and patient information card has been provided to patient /or left at bedside.    PICC/Midline Placement Documentation  PICC Single Lumen 04/23/19 PICC Right Brachial 36 cm 1 cm (Active)  Indication for Insertion or Continuance of Line Home intravenous therapies (PICC only) 04/23/19 1455  Exposed Catheter (cm) 1 cm 04/23/19 1455  Site Assessment Clean;Dry;Intact 04/23/19 1455  Line Status Flushed;Blood return noted 04/23/19 1455  Dressing Type Transparent 04/23/19 1455  Dressing Status Clean;Dry;Intact;Antimicrobial disc in place 04/23/19 1455  Dressing Intervention New dressing 04/23/19 1455  Dressing Change Due 04/30/19 04/23/19 1455       Alicia Lynch 04/23/2019, 2:56 PM

## 2019-04-23 NOTE — Progress Notes (Signed)
Diagnosis: Septic arthritis with osteomyelitis  Culture Result: Culture negative  Allergies  Allergen Reactions  . Tramadol Anaphylaxis and Swelling    Throat swells  . Motrin [Ibuprofen] Itching  . Flexeril [Cyclobenzaprine] Other (See Comments)    Causes excessive drowsiness    OPAT Orders Discharge antibiotics: Vancomycin + ceftriaxone Per pharmacy protocol 5  Aim for Vancomycin trough 15-20 (unless otherwise indicated) Duration: 6 weeks  End Date: 06/01/2019   [facility] Care Per Protocol:  Labs weekly while on IV antibiotics: _X_ CBC with differential _X_ BMP (twice a week) ___ CMP _X_ CRP _X_ ESR _X_ Vancomycin trough __ CK  _X_ Please pull PIC at completion of IV antibiotics __ Please leave PIC in place until doctor has seen patient or been notified  Fax weekly labs to [phone]  Clinic Follow Up Appt: Followup with Dr. Delaine Lame as planned  Jeanmarie Hubert, MD, PGY1 04/23/2019, 2:24 PM Pager: [phone]

## 2019-04-23 NOTE — Progress Notes (Signed)
PHARMACY CONSULT NOTE FOR:  OUTPATIENT  PARENTERAL ANTIBIOTIC THERAPY (OPAT)  Indication: Septic arthritis/Osteomyelitis of left shoulder  Regimen: Vancomycin 750 mg IV Q 12 hours + Ceftriaxone 2 gm every 24 hours  End date: 06/01/2019  IV antibiotic discharge orders are pended. To discharging provider:  please sign these orders via discharge navigator,  Select New Orders & click on the button choice - Manage This Unsigned Work.     Thank you for allowing pharmacy to be a part of this patient's care.  Jimmy Footman, PharmD, BCPS, BCIDP Infectious Diseases Clinical Pharmacist Phone: [phone] 04/23/2019, 1:51 PM

## 2019-04-23 NOTE — Discharge Summary (Signed)
Physician Discharge Summary  Alicia Lynch IPJ:[id number] DOB: Jul 18, 1966 DOA: 04/18/2019  PCP: Lorelee Market, MD  Admit date: 04/18/2019 Discharge date: 04/23/2019  Admitted From: Home Disposition: Home  Recommendations for Outpatient Follow-up:  1. Follow up with orthopedic surgery and infectious disease as below. 2. Please obtain CBC/BMP/Mag at follow up 3. Please follow up on the following pending results: None  Home Health: None Equipment/Devices: None  Discharge Condition: Stable CODE STATUS: Full code  Hospital Course: 53 y.o.femalewith history of HIV (CD4 count 267, COPD, GERD, HTN, prediabetes and osteoarthritis presenting with left shoulder pain and found to have septic arthritis of left shoulder as well as osteomyelitis. Patient underwent I&D of the left shoulder, hemiarthroplasty with antibiotic cement spacer placement impregnated with vancomycin and daptomycin.  Specimen culture did not grow an organism at time of discharge.   She was started on IV vancomycin and Rocephin as per infectious disease team recommendation.  Patient will be discharged on IV vancomycin and Rocephin through 05/30/2019 to complete 6 weeks course.  PICC line placed prior to discharge.  Patient to follow-up with orthopedic surgeon and infectious disease as below.  See individual problem list below for more.  Discharge Diagnoses:  Left pyogenic arthritis of the shoulder: Patient is status post I&D, hemiarthroplasty and antibiotic cement spacer placement.   Specimen cultures negative IV vancomycin and Rocephin via PEG through 05/30/2019    HIV: CD4 count 267.  HIV quantitative RNA 223,000 on 03/15/2019 -Discharged on home Biktarvy  HTN/chronic pain syndrome/GERD/COPD: stable -Discharged on home medications.  Anemia of chronic disease/normocytic anemia: Hgb at baseline.   Discharge Instructions  Discharge Instructions    Call MD for:  difficulty breathing, headache or visual  disturbances   Complete by: As directed    Call MD for:  extreme fatigue   Complete by: As directed    Call MD for:  persistant dizziness or light-headedness   Complete by: As directed    Call MD for:  redness, tenderness, or signs of infection (pain, swelling, redness, odor or green/yellow discharge around incision site)   Complete by: As directed    Call MD for:  severe uncontrolled pain   Complete by: As directed    Call MD for:  temperature >100.4   Complete by: As directed    Diet general   Complete by: As directed    Discharge instructions   Complete by: As directed    It has been a pleasure taking care of you! You were admitted with left shoulder infection.  You had surgery.  We are discharging you on IV antibiotics that you need to continue for 6 weeks.  Please review your new medication list and the directions before you take your medications. Please call your surgeon and infectious disease doctor as soon as possible to schedule hospital follow-up as recommended.  Take care,   Home infusion instructions [facility] May follow [place] Dosing Protocol; May administer Cathflo as needed to maintain patency of vascular access device.; Flushing of vascular access device: per [facility] Protocol: 0.9% NaCl pre/post medica...   Complete by: As directed    Instructions: May follow [place] Dosing Protocol   Instructions: May administer Cathflo as needed to maintain patency of vascular access device.   Instructions: Flushing of vascular access device: per [facility] Protocol: 0.9% NaCl pre/post medication administration and prn patency; Heparin 100 u/ml, 49m for implanted ports and Heparin 10u/ml, 548mfor all other central venous catheters.   Instructions: [facility]  Anaphylaxis Protocol for First Dose Administration in the home: 0.9% NaCl at 25-50 ml/hr to maintain IV access for protocol meds. Epinephrine 0.3 ml IV/IM PRN and Benadryl 25-50 IV/IM PRN s/s of anaphylaxis.    Instructions: [facility]fusion Coordinator (RN) to assist per patient IV care needs in the home PRN.   Increase activity slowly   Complete by: As directed      Allergies as of 04/23/2019      Reactions   Tramadol Anaphylaxis, Swelling   Throat swells   Motrin [ibuprofen] Itching   Flexeril [cyclobenzaprine] Other (See Comments)   Causes excessive drowsiness      Medication List    TAKE these medications   albuterol 0.63 MG/3ML nebulizer solution Commonly known as: ACCUNEB Inhale 0.63 mg into the lungs every 4 (four) hours as needed for wheezing or shortness of breath.   ProAir HFA 108 (90 Base) MCG/ACT inhaler Generic drug: albuterol Inhale 1-2 puffs into the lungs every 6 (six) hours as needed for wheezing or shortness of breath.   amLODipine 5 MG tablet Commonly known as: NORVASC Take 5 mg by mouth every morning.   BC HEADACHE POWDER PO Take 1 packet by mouth as needed (for pain).   Biktarvy 50-200-25 MG Tabs tablet Generic drug: bictegravir-emtricitabine-tenofovir AF Take 1 tablet by mouth daily.   buPROPion 300 MG 24 hr tablet Commonly known as: WELLBUTRIN XL Take 300 mg by mouth every morning.   cefTRIAXone  IVPB Commonly known as: ROCEPHIN Inject 2 g into the vein daily. Indication: Septic arthritis/osteomyelitis of left shoulder Last Day of Therapy:  06/01/2019  Labs - Once weekly:  CBC/D and BMP, Labs - Every other week:  ESR and CRP   cetirizine 10 MG tablet Commonly known as: ZYRTEC Take 10 mg by mouth daily as needed for allergies or rhinitis.   cyclobenzaprine 10 MG tablet Commonly known as: FLEXERIL Take 10 mg by mouth 3 (three) times daily as needed for muscle spasms.   fluticasone 220 MCG/ACT inhaler Commonly known as: FLOVENT HFA Inhale 1 puff into the lungs 2 (two) times daily.   gabapentin 400 MG capsule Commonly known as: NEURONTIN Take 400 mg by mouth 2 (two) times daily.   hydrOXYzine 25 MG tablet Commonly known as:  ATARAX/VISTARIL Take 25 mg by mouth 2 (two) times daily as needed for anxiety.   metoprolol tartrate 25 MG tablet Commonly known as: LOPRESSOR Take 25 mg by mouth 2 (two) times daily.   montelukast 10 MG tablet Commonly known as: SINGULAIR Take 10 mg by mouth at bedtime.   ondansetron 8 MG tablet Commonly known as: ZOFRAN Take 8 mg by mouth every 8 (eight) hours as needed for nausea or vomiting.   ONE-A-DAY WOMENS FORMULA PO Take 1 tablet by mouth daily with breakfast.   OXYGEN Inhale 2 L/min into the lungs See admin instructions. Inhale 2 l/min at bedtime and throughout the day as needed for shortness of breath   Spiriva HandiHaler 18 MCG inhalation capsule Generic drug: tiotropium Place 36 mcg into inhaler and inhale daily.   traZODone 50 MG tablet Commonly known as: DESYREL Take 50 mg by mouth at bedtime.   vancomycin  IVPB Inject 750 mg into the vein every 12 (twelve) hours. Indication:  Septic arthritis/osteomyelitis of left shoulder Last Day of Therapy:  06/01/2019 Labs - Sunday/Monday:  CBC/D, BMP, and vancomycin trough. Labs - Thursday:  BMP and vancomycin trough Labs - Every other week:  ESR and CRP  Home Infusion Instuctions  (From admission, onward)         Start     Ordered   04/23/19 0000  Home infusion instructions [facility] May follow [place] Dosing Protocol; May administer Cathflo as needed to maintain patency of vascular access device.; Flushing of vascular access device: per [facility] Protocol: 0.9% NaCl pre/post medica...    Question Answer Comment  Instructions May follow [place] Dosing Protocol   Instructions May administer Cathflo as needed to maintain patency of vascular access device.   Instructions Flushing of vascular access device: per [facility] Protocol: 0.9% NaCl pre/post medication administration and prn patency; Heparin 100 u/ml, 36m for implanted ports and Heparin 10u/ml, 515mfor all other central venous catheters.    Instructions May follow [facility] Anaphylaxis Protocol for First Dose Administration in the home: 0.9% NaCl at 25-50 ml/hr to maintain IV access for protocol meds. Epinephrine 0.3 ml IV/IM PRN and Benadryl 25-50 IV/IM PRN s/s of anaphylaxis.   Instructions [facility] Infusion Coordinator (RN) to assist per patient IV care needs in the home PRN.      04/23/19 1432         Follow-up Information    NiLorelee MarketMD .   Specialty: Family Medicine Contact information: [phone]      RaTsosie BillingMD Follow up in 2 week(s).   Specialty: Infectious Diseases Contact information: [phone]        ChTania AdeMD. Schedule an appointment as soon as possible for a visit in 10 day(s).   Specialty: Orthopedic Surgery Contact information: [email or web address] [phone]         Consultations:  Orthopedic surgery  Infectious disease  Procedures/Studies:  2D Echo: None  Dg Chest 2 View  Result Date: 04/18/2019 CLINICAL DATA:  Septic shoulder. EXAM: CHEST - 2 VIEW COMPARISON:  None. FINDINGS: Heart and mediastinal contours are within normal limits. No focal opacities or effusions. No acute bony abnormality. IMPRESSION: No active cardiopulmonary disease. Electronically Signed   By: KeRolm Baptise.D.   On: 04/18/2019 19:05   Mr Shoulder Left Wo Contrast  Result Date: 04/18/2019 CLINICAL DATA:  Chronic left shoulder pain. EXAM: MRI OF THE LEFT SHOULDER WITHOUT CONTRAST TECHNIQUE: Multiplanar, multisequence MR imaging of the shoulder was performed. No intravenous contrast was administered. COMPARISON:  Radiographs 03/24/2019 FINDINGS: Rotator cuff: Severe rotator cuff tendinopathy/tendinosis which is probably septic related. No obvious full-thickness tear. Muscles: Extensive edema like signal abnormality extending back into the rotator cuff  muscles likely septic myositis. Biceps long head: Markedly thickened and demonstrating diffuse fluid-like signal intensity likely septic tendinopathy with large intrasubstance tear containing fluid/pus. Acromioclavicular Joint: Intact. Type 2 acromion. No lateral downsloping. Glenohumeral Joint: MR findings highly suspicious for septic arthritis with a large complex joint effusion, severe synovitis and associated osteomyelitis involving the humerus and glenoid with bone abscesses. The articular cartilage is likely destroyed. Labrum:  Severely degenerated and torn. Bones: Extensive marrow edema and multiloculated fluid collections in the humeral head and glenoid consistent with osteomyelitis and bone abscesses. Other: Septic subacromial/subdeltoid bursitis. IMPRESSION: 1. MR findings most consistent with severe septic arthritis, septic rotator cuff disease, septic myositis and septic biceps tendinitis. 2. 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 These results will be called to the ordering clinician or representative by the Radiologist Assistant, and communication documented in the PACS  or zVision Dashboard. Electronically Signed   By: Marijo Sanes M.D.   On: 04/18/2019 14:23   Dg Shoulder Left Port  Result Date: 04/20/2019 CLINICAL DATA:  Post shoulder surgery EXAM: LEFT SHOULDER - 1 VIEW COMPARISON:  Shoulder MRI 04/18/2019, shoulder radiograph 03/24/2019 FINDINGS: Patient is now post resection shaft of the left humeral head with left shoulder hemiarthroplasty. A left humeral head prosthesis is in place transfixed by metallic pin. Surgical drains are noted. Normal expected alignment. No other bony destructive changes. No unexpected foreign body. IMPRESSION: Expected postoperative appearance without acute postoperative complication. Electronically Signed   By: Lovena Le M.D.   On: 04/20/2019 20:42   [place] Ekg Site Rite  Result Date: 04/23/2019 If Site Rite image not  attached, placement could not be confirmed due to current cardiac rhythm.     Subjective: No major events overnight of this morning.  Complains left shoulder pain.  Rates her pain as 10/10 but not in distress.  Dressing over anterior aspect of left shoulder appears clean and dry.  No apparent swelling.  She is in arm sling.  Denies chest pain, dyspnea or palpitation.  Denies GI or GU symptoms.   Discharge Exam: Vitals:   04/23/19 0811 04/23/19 1420  BP:  130/82  Pulse:  98  Resp:  18  Temp:  98.7 F (37.1 C)  SpO2: 99% 99%    GENERAL: No acute distress.  Appears well.  HEENT: MMM.  Vision and hearing grossly intact.  NECK: Supple.  No JVD.  LUNGS:  No IWOB. Good air movement bilaterally. HEART:  RRR. Heart sounds normal.  ABD: Bowel sounds present. Soft. Non tender.  MSK/EXT:  Moves all extremities. No apparent deformity. No edema bilaterally. SKIN: no apparent skin lesion or wound.  Dressing over anterior aspect of left shoulder appears dry and clean. NEURO: Awake, alert and oriented appropriately.  No gross deficit.  PSYCH: Calm. Normal affect.   The results of significant diagnostics from this hospitalization (including imaging, microbiology, ancillary and laboratory) are listed below for reference.     Microbiology: Recent Results (from the past 240 hour(s))  Blood culture (routine x 2)     Status: None (Preliminary result)   Collection Time: 04/18/19  9:56 PM   Specimen: BLOOD  Result Value Ref Range Status   Specimen Description BLOOD RIGHT ANTECUBITAL  Final   Special Requests   Final    BOTTLES DRAWN AEROBIC AND ANAEROBIC Blood Culture adequate volume   Culture   Final    NO GROWTH 4 DAYS Performed at [facility], [address], [place], [place] [address]    Report Status PENDING  Incomplete  Blood culture (routine x 2)     Status: None (Preliminary result)   Collection Time: 04/18/19  9:56 PM   Specimen: BLOOD RIGHT WRIST  Result Value Ref Range  Status   Specimen Description BLOOD RIGHT WRIST  Final   Special Requests   Final    BOTTLES DRAWN AEROBIC ONLY Blood Culture adequate volume   Culture   Final    NO GROWTH 4 DAYS Performed at [facility], [place] [address] [place],  [address]    Report Status PENDING  Incomplete  SARS Coronavirus 2 (CEPHEID - Performed in [place] hospital lab), Hosp Order     Status: None   Collection Time: 04/18/19 11:35 PM   Specimen: Nasopharyngeal Swab  Result Value Ref Range Status   SARS Coronavirus 2 NEGATIVE NEGATIVE Final  Comment: (NOTE) If result is NEGATIVE SARS-CoV-2 target nucleic acids are NOT DETECTED. The SARS-CoV-2 RNA is generally detectable in upper and lower  respiratory specimens during the acute phase of infection. The lowest  concentration of SARS-CoV-2 viral copies this assay can detect is 250  copies / mL. A negative result does not preclude SARS-CoV-2 infection  and should not be used as the sole basis for treatment or other  patient management decisions.  A negative result may occur with  improper specimen collection / handling, submission of specimen other  than nasopharyngeal swab, presence of viral mutation(s) within the  areas targeted by this assay, and inadequate number of viral copies  (<250 copies / mL). A negative result must be combined with clinical  observations, patient history, and epidemiological information. If result is POSITIVE SARS-CoV-2 target nucleic acids are DETECTED. The SARS-CoV-2 RNA is generally detectable in upper and lower  respiratory specimens dur ing the acute phase of infection.  Positive  results are indicative of active infection with SARS-CoV-2.  Clinical  correlation with patient history and other diagnostic information is  necessary to determine patient infection status.  Positive results do  not rule out bacterial infection or co-infection with other viruses. If result is PRESUMPTIVE POSTIVE SARS-CoV-2 nucleic  acids MAY BE PRESENT.   A presumptive positive result was obtained on the submitted specimen  and confirmed on repeat testing.  While 2019 novel coronavirus  (SARS-CoV-2) nucleic acids may be present in the submitted sample  additional confirmatory testing may be necessary for epidemiological  and / or clinical management purposes  to differentiate between  SARS-CoV-2 and other Sarbecovirus currently known to infect humans.  If clinically indicated additional testing with an alternate test  methodology [phone]) is advised. The SARS-CoV-2 RNA is generally  detectable in upper and lower respiratory sp ecimens during the acute  phase of infection. The expected result is Negative. Fact Sheet for Patients:  StrictlyIdeas.no Fact Sheet for Healthcare Providers: BankingDealers.co.za This test is not yet approved or cleared by the [place] FDA and has been authorized for detection and/or diagnosis of SARS-CoV-2 by FDA under an Emergency Use Authorization (EUA).  This EUA will remain in effect (meaning this test can be used) for the duration of the COVID-19 declaration under Section 564(b)(1) of the Act, 21 U.S.C. section 360bbb-3(b)(1), unless the authorization is terminated or revoked sooner. Performed at [facility], [place] [address] [place], [place] [address]   Surgical pcr screen     Status: Abnormal   Collection Time: 04/19/19  9:31 PM   Specimen: Nasal Mucosa; Nasal Swab  Result Value Ref Range Status   MRSA, PCR NEGATIVE NEGATIVE Final   Staphylococcus aureus POSITIVE (A) NEGATIVE Final    Comment: (NOTE) The Xpert SA Assay (FDA approved for NASAL specimens in patients 51 years of age and older), is one component of a comprehensive surveillance program. It is not intended to diagnose infection nor to guide or monitor treatment. Performed at [facility], [place] [address] [place], [place] [address]    Aerobic/Anaerobic Culture (surgical/deep wound)     Status: None (Preliminary result)   Collection Time: 04/20/19  5:20 PM   Specimen: Bone; Tissue  Result Value Ref Range Status   Specimen Description TISSUE LEFT HUMERAL HEAD BONE  Final   Special Requests PATIENT ON FOLLOWING ANCEF  Final   Gram Stain   Final    FEW WBC PRESENT, PREDOMINANTLY MONONUCLEAR NO ORGANISMS SEEN  Culture   Final    NO GROWTH 3 DAYS NO ANAEROBES ISOLATED; CULTURE IN PROGRESS FOR 5 DAYS Performed at [facility], [place] [address] [place], [place] [address]    Report Status PENDING  Incomplete  Aerobic/Anaerobic Culture (surgical/deep wound)     Status: None (Preliminary result)   Collection Time: 04/20/19  5:21 PM   Specimen: Joint, Other; Tissue  Result Value Ref Range Status   Specimen Description TISSUE LEFT SHOULDER  Final   Special Requests PATIENT ON FOLLOWING ANCEF  Final   Gram Stain   Final    ABUNDANT WBC PRESENT,BOTH PMN AND MONONUCLEAR NO ORGANISMS SEEN    Culture   Final    NO GROWTH 3 DAYS NO ANAEROBES ISOLATED; CULTURE IN PROGRESS FOR 5 DAYS Performed at [facility], [place] [address], [place], [place] [address]    Report Status PENDING  Incomplete  Aerobic/Anaerobic Culture (surgical/deep wound)     Status: None (Preliminary result)   Collection Time: 04/20/19  5:22 PM   Specimen: Joint, Other; Body Fluid  Result Value Ref Range Status   Specimen Description ABSCESS LEFT SHOULDER  Final   Special Requests PATIENT ON FOLLOWING ANCEF  Final   Gram Stain   Final    ABUNDANT WBC PRESENT,BOTH PMN AND MONONUCLEAR NO ORGANISMS SEEN    Culture   Final    NO GROWTH 3 DAYS NO ANAEROBES ISOLATED; CULTURE IN PROGRESS FOR 5 DAYS Performed at [facility], [place] [address] [place], [place] [address]    Report Status PENDING  Incomplete     Labs: BNP (last 3 results) No results for input(s): BNP in the last 8760 hours. Basic Metabolic Panel: Recent Labs  Lab 04/18/19  1833 04/19/19 0022 04/19/19 1124 04/20/19 0323 04/22/19 0404  NA 139 140 136 137  --   K 3.1* 2.8* 3.3* 4.2  --   CL 109 108 107 112*  --   CO2 _0 19*  --   GLUCOSE 87 89 105* 137*  --   BUN 6 5* <5* 6  --   CREATININE 0.89 0.86 0.78 0.95 0.70  CALCIUM 9.0 9.2 8.5* 8.4*  --   MG  --   --  1.5* 2.1  --   PHOS  --   --  3.0 2.5  --    Liver Function Tests: Recent Labs  Lab 04/18/19 1833 04/19/19 0022 04/19/19 1124 04/20/19 0323  AST 18 18  --   --   ALT 11 11  --   --   ALKPHOS 74 76  --   --   BILITOT 0.7 0.6  --   --   PROT 9.3* 9.4*  --   --   ALBUMIN 2.2* 2.2* 1.8* 1.6*   No results for input(s): LIPASE, AMYLASE in the last 168 hours. No results for input(s): AMMONIA in the last 168 hours. CBC: Recent Labs  Lab 04/18/19 1833 04/19/19 0022 04/21/19 0435  WBC 5.6 4.3 5.5  NEUTROABS 1.8  --   --   HGB 9.7* 10.3* 8.9*  HCT 30.9* 32.6* 28.2*  MCV 81.3 81.9 81.7  PLT 413* 458* 385   Cardiac Enzymes: No results for input(s): CKTOTAL, CKMB, CKMBINDEX, TROPONINI in the last 168 hours. BNP: Invalid input(s): POCBNP CBG: Recent Labs  Lab 04/20/19 1437 04/20/19 1807  GLUCAP 71 93   D-Dimer No results for input(s): DDIMER in the last 72 hours. Hgb A1c No results for input(s): HGBA1C  in the last 72 hours. Lipid Profile No results for input(s): CHOL, HDL, LDLCALC, TRIG, CHOLHDL, LDLDIRECT in the last 72 hours. Thyroid function studies No results for input(s): TSH, T4TOTAL, T3FREE, THYROIDAB in the last 72 hours.  Invalid input(s): FREET3 Anemia work up No results for input(s): VITAMINB12, FOLATE, FERRITIN, TIBC, IRON, RETICCTPCT in the last 72 hours. Urinalysis    Component Value Date/Time   COLORURINE YELLOW 04/18/2019 1940   APPEARANCEUR CLEAR 04/18/2019 1940   LABSPEC 1.017 04/18/2019 1940   PHURINE 5.0 04/18/2019 1940   GLUCOSEU NEGATIVE 04/18/2019 1940   HGBUR MODERATE (A) 04/18/2019 1940   BILIRUBINUR NEGATIVE 04/18/2019 1940   KETONESUR  NEGATIVE 04/18/2019 1940   PROTEINUR NEGATIVE 04/18/2019 1940   NITRITE NEGATIVE 04/18/2019 1940   LEUKOCYTESUR TRACE (A) 04/18/2019 1940   Sepsis Labs Invalid input(s): PROCALCITONIN,  WBC,  LACTICIDVEN   Time coordinating discharge: 35 minutes  SIGNED:  Mercy Riding, MD  [facility] 04/23/2019, 2:35 PM  If 7PM-7AM, please contact night-coverage [email or web address] Password [email or web address]

## 2019-04-24 LAB — CULTURE, BLOOD (ROUTINE X 2)
Culture: NO GROWTH
Culture: NO GROWTH
Special Requests: ADEQUATE
Special Requests: ADEQUATE

## 2019-04-25 LAB — GENOSURE PRIME (GSPRIL)

## 2019-04-25 LAB — HIV-1 RNA QUANT-NO REFLEX-BLD
HIV 1 RNA Quant: 190 copies/mL
LOG10 HIV-1 RNA: 2.279 log10copy/mL

## 2019-04-26 ENCOUNTER — Encounter: Payer: Self-pay | Admitting: Infectious Diseases

## 2019-04-26 ENCOUNTER — Other Ambulatory Visit: Payer: Self-pay

## 2019-04-26 ENCOUNTER — Ambulatory Visit: Payer: Medicaid Other | Attending: Infectious Diseases | Admitting: Infectious Diseases

## 2019-04-26 VITALS — BP 120/82 | HR 97 | Temp 98.7°F | Ht 64.0 in | Wt 166.1 lb

## 2019-04-26 DIAGNOSIS — M009 Pyogenic arthritis, unspecified: Secondary | ICD-10-CM

## 2019-04-26 DIAGNOSIS — B20 Human immunodeficiency virus [HIV] disease: Secondary | ICD-10-CM

## 2019-04-26 DIAGNOSIS — M545 Low back pain: Secondary | ICD-10-CM

## 2019-04-26 DIAGNOSIS — Z89232 Acquired absence of left shoulder: Secondary | ICD-10-CM

## 2019-04-26 DIAGNOSIS — Z888 Allergy status to other drugs, medicaments and biological substances status: Secondary | ICD-10-CM

## 2019-04-26 DIAGNOSIS — M86222 Subacute osteomyelitis, left humerus: Secondary | ICD-10-CM

## 2019-04-26 DIAGNOSIS — M869 Osteomyelitis, unspecified: Secondary | ICD-10-CM | POA: Insufficient documentation

## 2019-04-26 DIAGNOSIS — Z886 Allergy status to analgesic agent status: Secondary | ICD-10-CM

## 2019-04-26 DIAGNOSIS — Z79899 Other long term (current) drug therapy: Secondary | ICD-10-CM

## 2019-04-26 DIAGNOSIS — Z95828 Presence of other vascular implants and grafts: Secondary | ICD-10-CM

## 2019-04-26 DIAGNOSIS — Z885 Allergy status to narcotic agent status: Secondary | ICD-10-CM
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
## 2019-04-26 NOTE — Patient Instructions (Addendum)
You are here for follow-up after recent hospitalization at [facility]..  On 04/19/2021 you underwent open debridement of the left shoulder and removal of the bone which had the infection.  You will complete 6 weeks of antibiotics.  You currently are on vancomycin which is to be taken twice a day 750 mg in the morning and 750 mg in the evening and then ceftriaxone which is 2 g once a day.  There has been a misunderstanding and you have been taking vancomycin only once a day.  So please take the evening dose as well.  Both the doses have to be 12 hours apart. While you were in the hospital they also checked your T-cell count and it is increased to 267 and a viral load is 149.  So continue to take the Biktarvy every day.. I will see you back in 3 to 4 weeks.

## 2019-05-11 ENCOUNTER — Other Ambulatory Visit: Payer: Self-pay

## 2019-05-11 ENCOUNTER — Ambulatory Visit
Admission: RE | Admit: 2019-05-11 | Discharge: 2019-05-11 | Disposition: A | Discharge status: Home / Self Care | Payer: Medicaid Other | Source: Ambulatory Visit | Attending: Oncology | Admitting: Oncology

## 2019-05-11 DIAGNOSIS — R937 Abnormal findings on diagnostic imaging of other parts of musculoskeletal system: Secondary | ICD-10-CM | POA: Diagnosis not present

## 2019-05-11 DIAGNOSIS — D649 Anemia, unspecified: Secondary | ICD-10-CM | POA: Diagnosis present

## 2019-05-11 LAB — AEROBIC/ANAEROBIC CULTURE W GRAM STAIN (SURGICAL/DEEP WOUND)
Culture: NO GROWTH
Culture: NO GROWTH
Culture: NO GROWTH

## 2019-05-11 MED ORDER — GADOBUTROL 1 MMOL/ML IV SOLN
7.0000 mL | Freq: Once | INTRAVENOUS | Status: AC | PRN
  Administered 2019-05-11: 7 mL via INTRAVENOUS

## 2019-05-13 LAB — CULTURE, FUNGUS WITHOUT SMEAR

## 2019-05-14 ENCOUNTER — Other Ambulatory Visit: Payer: Self-pay

## 2019-05-15 ENCOUNTER — Inpatient Hospital Stay: Payer: Medicaid Other | Attending: Oncology

## 2019-05-15 ENCOUNTER — Inpatient Hospital Stay (HOSPITAL_BASED_OUTPATIENT_CLINIC_OR_DEPARTMENT_OTHER): Payer: Medicaid Other | Admitting: Oncology

## 2019-05-15 ENCOUNTER — Other Ambulatory Visit: Payer: Self-pay

## 2019-05-15 ENCOUNTER — Inpatient Hospital Stay: Payer: Medicaid Other

## 2019-05-15 ENCOUNTER — Encounter: Payer: Self-pay | Admitting: Oncology

## 2019-05-15 ENCOUNTER — Inpatient Hospital Stay: Payer: Medicaid Other | Admitting: Oncology

## 2019-05-15 VITALS — BP 164/110 | HR 100 | Temp 98.3°F | Resp 16 | Ht 64.0 in | Wt 169.3 lb

## 2019-05-15 DIAGNOSIS — D649 Anemia, unspecified: Secondary | ICD-10-CM | POA: Diagnosis not present

## 2019-05-15 DIAGNOSIS — Z800 Family history of malignant neoplasm of digestive organs: Secondary | ICD-10-CM | POA: Insufficient documentation

## 2019-05-15 DIAGNOSIS — Z803 Family history of malignant neoplasm of breast: Secondary | ICD-10-CM | POA: Insufficient documentation

## 2019-05-15 DIAGNOSIS — M869 Osteomyelitis, unspecified: Secondary | ICD-10-CM | POA: Diagnosis not present

## 2019-05-15 DIAGNOSIS — Z21 Asymptomatic human immunodeficiency virus [HIV] infection status: Secondary | ICD-10-CM | POA: Diagnosis not present

## 2019-05-15 DIAGNOSIS — D709 Neutropenia, unspecified: Secondary | ICD-10-CM

## 2019-05-15 LAB — CBC WITH DIFFERENTIAL/PLATELET
Abs Immature Granulocytes: 0.01 10*3/uL (ref 0.00–0.07)
Basophils Absolute: 0.1 10*3/uL (ref 0.0–0.1)
Basophils Relative: 3 %
Eosinophils Absolute: 0.3 10*3/uL (ref 0.0–0.5)
Eosinophils Relative: 9 %
HCT: 31.8 % — ABNORMAL LOW (ref 36.0–46.0)
Hemoglobin: 10.1 g/dL — ABNORMAL LOW (ref 12.0–15.0)
Immature Granulocytes: 0 %
Lymphocytes Relative: 50 %
Lymphs Abs: 1.7 10*3/uL (ref 0.7–4.0)
MCH: 26.8 pg (ref 26.0–34.0)
MCHC: 31.8 g/dL (ref 30.0–36.0)
MCV: 84.4 fL (ref 80.0–100.0)
Monocytes Absolute: 0.4 10*3/uL (ref 0.1–1.0)
Monocytes Relative: 10 %
Neutro Abs: 1 10*3/uL — ABNORMAL LOW (ref 1.7–7.7)
Neutrophils Relative %: 28 %
Platelets: 293 10*3/uL (ref 150–400)
RBC: 3.77 MIL/uL — ABNORMAL LOW (ref 3.87–5.11)
RDW: 18.2 % — ABNORMAL HIGH (ref 11.5–15.5)
WBC: 3.4 10*3/uL — ABNORMAL LOW (ref 4.0–10.5)
nRBC: 0 % (ref 0.0–0.2)

## 2019-05-15 NOTE — Progress Notes (Signed)
Pt having pain today, she recently had infection in left shoulder and is on 2 IVATB til 8/28. Her shoulder is hurting and rated pain 9/ no pain med taken due to coming over here. She has PICC line in left arm. B/p elevated today
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
## 2019-05-16 LAB — KAPPA/LAMBDA LIGHT CHAINS
Kappa free light chain: 141.8 mg/L — ABNORMAL HIGH (ref 3.3–19.4)
Kappa, lambda light chain ratio: 1.17 (ref 0.26–1.65)
Lambda free light chains: 120.8 mg/L — ABNORMAL HIGH (ref 5.7–26.3)

## 2019-05-29 ENCOUNTER — Other Ambulatory Visit
Admission: RE | Admit: 2019-05-29 | Discharge: 2019-05-29 | Disposition: A | Discharge status: Home / Self Care | Payer: Medicaid Other | Source: Ambulatory Visit | Attending: Infectious Diseases | Admitting: Infectious Diseases

## 2019-05-29 ENCOUNTER — Encounter: Payer: Self-pay | Admitting: Infectious Diseases

## 2019-05-29 ENCOUNTER — Ambulatory Visit: Payer: Medicaid Other | Attending: Infectious Diseases | Admitting: Infectious Diseases

## 2019-05-29 ENCOUNTER — Other Ambulatory Visit: Payer: Self-pay

## 2019-05-29 ENCOUNTER — Other Ambulatory Visit: Payer: Self-pay | Admitting: Infectious Diseases

## 2019-05-29 VITALS — BP 161/91 | HR 92 | Temp 98.1°F | Ht 64.0 in | Wt 172.5 lb

## 2019-05-29 DIAGNOSIS — F419 Anxiety disorder, unspecified: Secondary | ICD-10-CM

## 2019-05-29 DIAGNOSIS — I10 Essential (primary) hypertension: Secondary | ICD-10-CM

## 2019-05-29 DIAGNOSIS — Z23 Encounter for immunization: Secondary | ICD-10-CM

## 2019-05-29 DIAGNOSIS — Z79899 Other long term (current) drug therapy: Secondary | ICD-10-CM

## 2019-05-29 DIAGNOSIS — M545 Low back pain: Secondary | ICD-10-CM | POA: Diagnosis not present

## 2019-05-29 DIAGNOSIS — J45909 Unspecified asthma, uncomplicated: Secondary | ICD-10-CM

## 2019-05-29 DIAGNOSIS — G8929 Other chronic pain: Secondary | ICD-10-CM

## 2019-05-29 DIAGNOSIS — B20 Human immunodeficiency virus [HIV] disease: Secondary | ICD-10-CM

## 2019-05-29 DIAGNOSIS — Z886 Allergy status to analgesic agent status: Secondary | ICD-10-CM

## 2019-05-29 DIAGNOSIS — D638 Anemia in other chronic diseases classified elsewhere: Secondary | ICD-10-CM

## 2019-05-29 DIAGNOSIS — Z888 Allergy status to other drugs, medicaments and biological substances status: Secondary | ICD-10-CM

## 2019-05-29 DIAGNOSIS — Z21 Asymptomatic human immunodeficiency virus [HIV] infection status: Secondary | ICD-10-CM | POA: Diagnosis not present

## 2019-05-29 DIAGNOSIS — Z885 Allergy status to narcotic agent status: Secondary | ICD-10-CM

## 2019-05-29 DIAGNOSIS — Z7951 Long term (current) use of inhaled steroids: Secondary | ICD-10-CM

## 2019-05-29 DIAGNOSIS — F329 Major depressive disorder, single episode, unspecified: Secondary | ICD-10-CM
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
## 2019-05-29 NOTE — Patient Instructions (Addendum)
You are here for follow up of HIV- today we did labs to check Viral load and cd4. You are also on IV vancomycin and Iv ceftriaxone which you will finish on 8/30 and then you would go on 2 weeks of oral antibiotic levaquin

## 2019-05-30 LAB — T-HELPER CELLS CD4/CD8 %
% CD 4 Pos. Lymph.: 12.3 % — ABNORMAL LOW (ref 30.8–58.5)
Absolute CD 4 Helper: 271 /uL — ABNORMAL LOW (ref 359–1519)
Basophils Absolute: 0.1 10*3/uL (ref 0.0–0.2)
Basos: 1 %
CD3+CD4+ Cells/CD3+CD8+ Cells Bld: 0.17 — ABNORMAL LOW (ref 0.92–3.72)
CD3+CD8+ Cells # Bld: 1549 /uL — ABNORMAL HIGH (ref 109–897)
CD3+CD8+ Cells NFr Bld: 70.4 % — ABNORMAL HIGH (ref 12.0–35.5)
EOS (ABSOLUTE): 0.2 10*3/uL (ref 0.0–0.4)
Eos: 5 %
Hematocrit: 32.1 % — ABNORMAL LOW (ref 34.0–46.6)
Hemoglobin: 10.5 g/dL — ABNORMAL LOW (ref 11.1–15.9)
Immature Grans (Abs): 0 10*3/uL (ref 0.0–0.1)
Immature Granulocytes: 0 %
Lymphocytes Absolute: 2.2 10*3/uL (ref 0.7–3.1)
Lymphs: 52 %
MCH: 27.7 pg (ref 26.6–33.0)
MCHC: 32.7 g/dL (ref 31.5–35.7)
MCV: 85 fL (ref 79–97)
Monocytes Absolute: 0.5 10*3/uL (ref 0.1–0.9)
Monocytes: 11 %
Neutrophils Absolute: 1.4 10*3/uL (ref 1.4–7.0)
Neutrophils: 31 %
Platelets: 293 10*3/uL (ref 150–450)
RBC: 3.79 x10E6/uL (ref 3.77–5.28)
RDW: 15 % (ref 11.7–15.4)
WBC: 4.3 10*3/uL (ref 3.4–10.8)

## 2019-05-30 LAB — HIV-1 RNA QUANT-NO REFLEX-BLD
HIV 1 RNA Quant: <20 copies/mL
LOG10 HIV-1 RNA: UNDETERMINED log10copy/mL

## 2019-05-31 ENCOUNTER — Other Ambulatory Visit: Payer: Self-pay | Admitting: Infectious Diseases

## 2019-06-04 ENCOUNTER — Other Ambulatory Visit: Payer: Self-pay | Admitting: Licensed Clinical Social Worker

## 2019-06-04 DIAGNOSIS — B20 Human immunodeficiency virus [HIV] disease: Secondary | ICD-10-CM

## 2019-06-04 MED ORDER — BIKTARVY 50-200-25 MG PO TABS: 1.0000 | ORAL_TABLET | Freq: Every day | ORAL | 3 refills | Status: DC

## 2019-06-05 LAB — ACID FAST CULTURE WITH REFLEXED SENSITIVITIES (MYCOBACTERIA)
Acid Fast Culture: NEGATIVE
Acid Fast Culture: NEGATIVE
Acid Fast Culture: NEGATIVE

## 2019-06-06 ENCOUNTER — Other Ambulatory Visit: Payer: Self-pay | Admitting: Infectious Diseases

## 2019-06-07 ENCOUNTER — Other Ambulatory Visit: Payer: Self-pay | Admitting: Licensed Clinical Social Worker

## 2019-06-07 ENCOUNTER — Telehealth: Payer: Self-pay | Admitting: Licensed Clinical Social Worker

## 2019-06-07 NOTE — Telephone Encounter (Signed)
Patient called wanting oral antibiotic called in to her pharmacy.

## 2019-06-08 ENCOUNTER — Other Ambulatory Visit: Payer: Self-pay | Admitting: Infectious Diseases

## 2019-06-08 MED ORDER — LEVOFLOXACIN 500 MG PO TABS: 500.0000 mg | ORAL_TABLET | Freq: Every day | ORAL | 0 refills | Status: DC

## 2019-06-08 NOTE — Telephone Encounter (Signed)
Sent eprescription for levaquin

## 2019-07-31 ENCOUNTER — Other Ambulatory Visit
Admission: RE | Admit: 2019-07-31 | Discharge: 2019-07-31 | Disposition: A | Discharge status: Home / Self Care | Payer: Medicaid Other | Source: Ambulatory Visit | Attending: Infectious Diseases | Admitting: Infectious Diseases

## 2019-07-31 ENCOUNTER — Ambulatory Visit: Payer: Medicaid Other | Attending: Infectious Diseases | Admitting: Infectious Diseases

## 2019-07-31 ENCOUNTER — Encounter: Payer: Self-pay | Admitting: Infectious Diseases

## 2019-07-31 ENCOUNTER — Other Ambulatory Visit: Payer: Self-pay

## 2019-07-31 VITALS — BP 125/89 | HR 86 | Resp 16 | Ht 64.0 in | Wt 190.0 lb

## 2019-07-31 DIAGNOSIS — G8929 Other chronic pain: Secondary | ICD-10-CM | POA: Diagnosis not present

## 2019-07-31 DIAGNOSIS — Z888 Allergy status to other drugs, medicaments and biological substances status: Secondary | ICD-10-CM

## 2019-07-31 DIAGNOSIS — F329 Major depressive disorder, single episode, unspecified: Secondary | ICD-10-CM

## 2019-07-31 DIAGNOSIS — B20 Human immunodeficiency virus [HIV] disease: Secondary | ICD-10-CM | POA: Insufficient documentation

## 2019-07-31 DIAGNOSIS — F419 Anxiety disorder, unspecified: Secondary | ICD-10-CM

## 2019-07-31 DIAGNOSIS — Z23 Encounter for immunization: Secondary | ICD-10-CM

## 2019-07-31 DIAGNOSIS — Z79899 Other long term (current) drug therapy: Secondary | ICD-10-CM

## 2019-07-31 DIAGNOSIS — Z8739 Personal history of other diseases of the musculoskeletal system and connective tissue: Secondary | ICD-10-CM

## 2019-07-31 DIAGNOSIS — Z885 Allergy status to narcotic agent status: Secondary | ICD-10-CM

## 2019-07-31 DIAGNOSIS — Z886 Allergy status to analgesic agent status: Secondary | ICD-10-CM

## 2019-07-31 DIAGNOSIS — J45909 Unspecified asthma, uncomplicated: Secondary | ICD-10-CM

## 2019-07-31 DIAGNOSIS — M545 Low back pain: Secondary | ICD-10-CM | POA: Diagnosis not present

## 2019-07-31 DIAGNOSIS — Z7951 Long term (current) use of inhaled steroids: Secondary | ICD-10-CM

## 2019-07-31 DIAGNOSIS — Z89232 Acquired absence of left shoulder: Secondary | ICD-10-CM

## 2019-07-31 DIAGNOSIS — I10 Essential (primary) hypertension: Secondary | ICD-10-CM

## 2019-07-31 DIAGNOSIS — D638 Anemia in other chronic diseases classified elsewhere: Secondary | ICD-10-CM

## 2019-07-31 LAB — COMPREHENSIVE METABOLIC PANEL
ALT: 8 U/L (ref 0–44)
AST: 13 U/L — ABNORMAL LOW (ref 15–41)
Albumin: 3.7 g/dL (ref 3.5–5.0)
Alkaline Phosphatase: 59 U/L (ref 38–126)
Anion gap: 8 (ref 5–15)
BUN: 14 mg/dL (ref 6–20)
CO2: 26 mmol/L (ref 22–32)
Calcium: 10 mg/dL (ref 8.9–10.3)
Chloride: 106 mmol/L (ref 98–111)
Creatinine, Ser: 0.94 mg/dL (ref 0.44–1.00)
GFR calc Af Amer: >60 mL/min (ref >60)
GFR calc non Af Amer: >60 mL/min (ref >60)
Glucose, Bld: 97 mg/dL (ref 70–99)
Potassium: 4.5 mmol/L (ref 3.5–5.1)
Sodium: 140 mmol/L (ref 135–145)
Total Bilirubin: 0.7 mg/dL (ref 0.3–1.2)
Total Protein: 8.7 g/dL — ABNORMAL HIGH (ref 6.5–8.1)

## 2019-07-31 LAB — C-REACTIVE PROTEIN: CRP: <0.8 mg/dL (ref <1.0)

## 2019-07-31 LAB — SEDIMENTATION RATE: Sed Rate: 46 mm/hr — ABNORMAL HIGH (ref 0–30)

## 2019-07-31 NOTE — Addendum Note (Signed)
Addended by: Santiago Bur on: 07/31/2019 09:52 AM   Modules accepted: Orders

## 2019-07-31 NOTE — Addendum Note (Signed)
Addended by: Theresia Majors A on: 07/31/2019 10:00 AM   Modules accepted: Orders
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
## 2019-07-31 NOTE — Patient Instructions (Signed)
You are here for follow up visit- your Vl is undetectable and last cd4 count is 275. Please continue biktavry- you have gained 24 pounds in 3 months- Would recommend to do more exercise and limit calories.if you need a new PCP within the [facility] for continuity of care the number is [phone]- [place] ([email or web address]) Today you got Flu vaccine and TdaP You will do labs today and I will speak with your orthopedic doctor Follow up 3 months

## 2019-08-01 LAB — T-HELPER CELLS CD4/CD8 %
% CD 4 Pos. Lymph.: 15.4 % — ABNORMAL LOW (ref 30.8–58.5)
Absolute CD 4 Helper: 323 /uL — ABNORMAL LOW (ref 359–1519)
Basophils Absolute: 0 10*3/uL (ref 0.0–0.2)
Basos: 1 %
CD3+CD4+ Cells/CD3+CD8+ Cells Bld: 0.23 — ABNORMAL LOW (ref 0.92–3.72)
CD3+CD8+ Cells # Bld: 1411 /uL — ABNORMAL HIGH (ref 109–897)
CD3+CD8+ Cells NFr Bld: 67.2 % — ABNORMAL HIGH (ref 12.0–35.5)
EOS (ABSOLUTE): 0.1 10*3/uL (ref 0.0–0.4)
Eos: 2 %
Hematocrit: 38.3 % (ref 34.0–46.6)
Hemoglobin: 12.5 g/dL (ref 11.1–15.9)
Immature Grans (Abs): 0 10*3/uL (ref 0.0–0.1)
Immature Granulocytes: 0 %
Lymphocytes Absolute: 2.1 10*3/uL (ref 0.7–3.1)
Lymphs: 62 %
MCH: 27.2 pg (ref 26.6–33.0)
MCHC: 32.6 g/dL (ref 31.5–35.7)
MCV: 83 fL (ref 79–97)
Monocytes Absolute: 0.3 10*3/uL (ref 0.1–0.9)
Monocytes: 8 %
Neutrophils Absolute: 0.9 10*3/uL — ABNORMAL LOW (ref 1.4–7.0)
Neutrophils: 27 %
Platelets: 254 10*3/uL (ref 150–450)
RBC: 4.59 x10E6/uL (ref 3.77–5.28)
RDW: 13.3 % (ref 11.7–15.4)
WBC: 3.3 10*3/uL — ABNORMAL LOW (ref 3.4–10.8)

## 2019-08-01 LAB — HIV-1 RNA QUANT-NO REFLEX-BLD
HIV 1 RNA Quant: <20 copies/mL
LOG10 HIV-1 RNA: UNDETERMINED log10copy/mL

## 2019-08-09 ENCOUNTER — Other Ambulatory Visit: Payer: Self-pay | Admitting: Orthopedic Surgery

## 2019-08-09 DIAGNOSIS — M25512 Pain in left shoulder: Secondary | ICD-10-CM

## 2019-08-15 ENCOUNTER — Inpatient Hospital Stay: Payer: Medicaid Other | Attending: Oncology

## 2019-08-25 ENCOUNTER — Other Ambulatory Visit: Payer: Self-pay

## 2019-08-25 ENCOUNTER — Ambulatory Visit
Admission: RE | Admit: 2019-08-25 | Discharge: 2019-08-25 | Disposition: A | Discharge status: Home / Self Care | Payer: Medicaid Other | Source: Ambulatory Visit | Attending: Orthopedic Surgery | Admitting: Orthopedic Surgery

## 2019-08-25 DIAGNOSIS — M25512 Pain in left shoulder: Secondary | ICD-10-CM

## 2019-09-03 IMAGING — DX CHEST - 2 VIEW
2 series · 2 of 2 positions shown · non-contrast
Comparison: None.

CLINICAL DATA: Septic shoulder.

EXAM:
CHEST - 2 VIEW

[w chest pa]
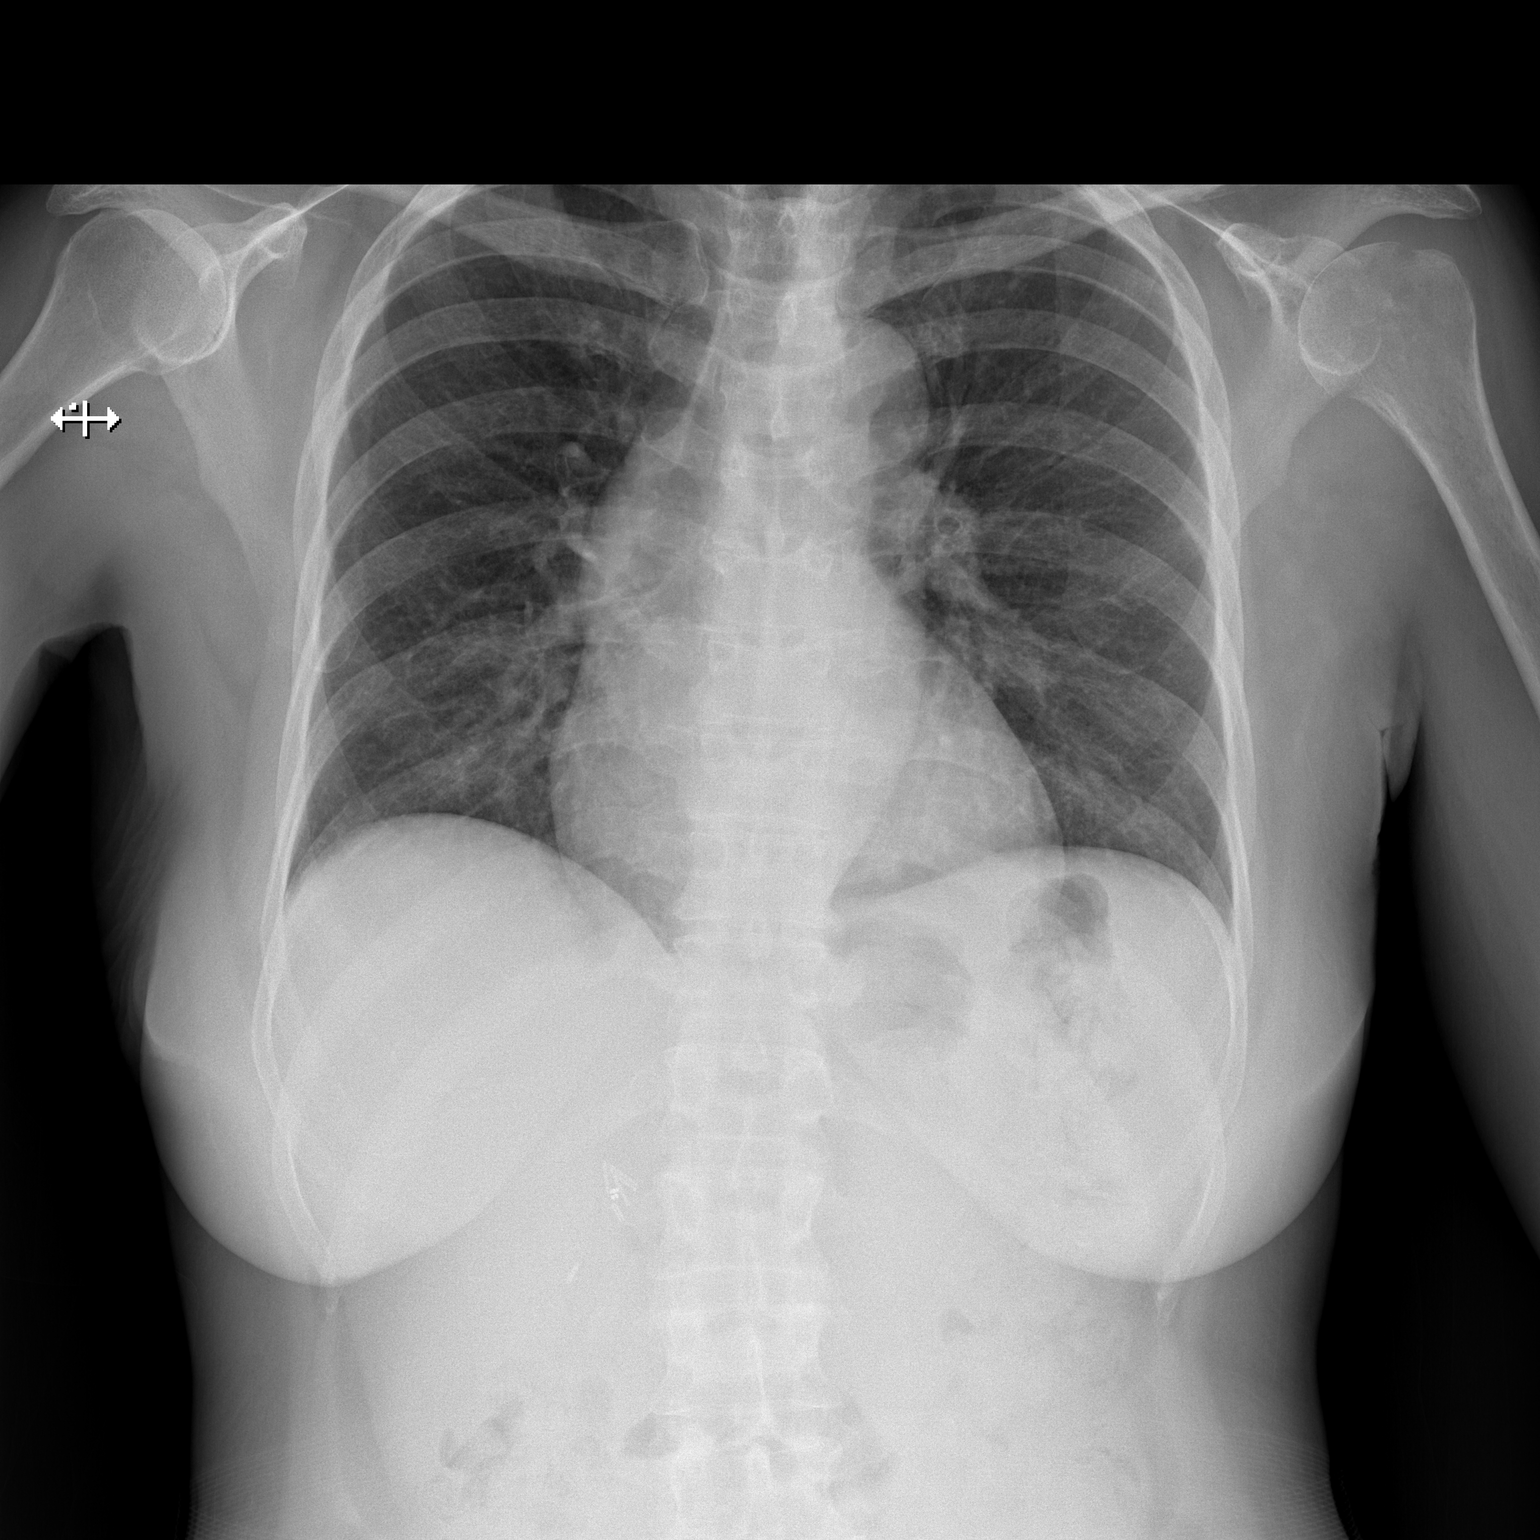

[w chest lat]
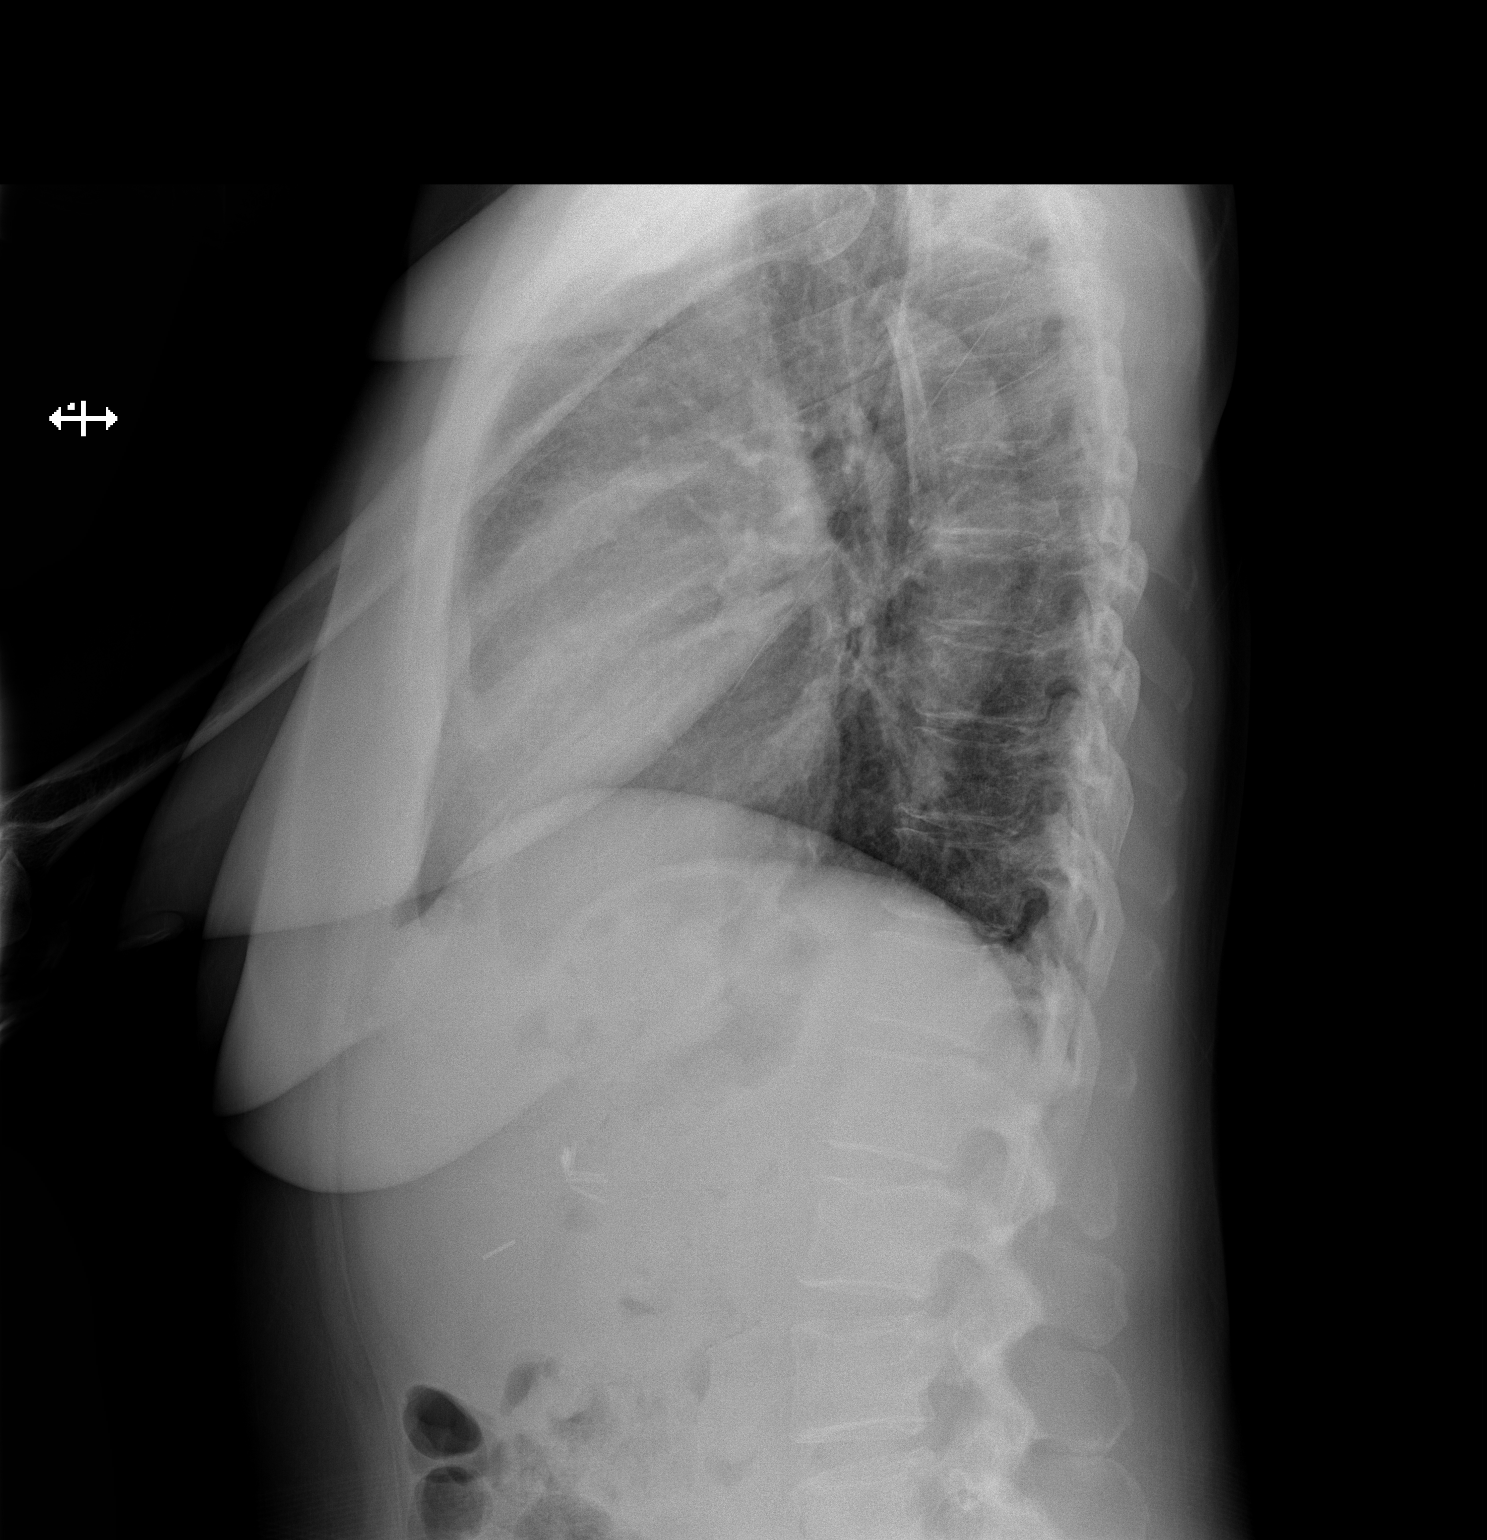

[2 of 2 positions shown; findings below may reference images not displayed]

FINDINGS: Heart and mediastinal contours are within normal limits. No focal
opacities or effusions. No acute bony abnormality.
IMPRESSION: No active cardiopulmonary disease.

## 2019-09-03 IMAGING — MR MRI OF THE LEFT SHOULDER WITHOUT CONTRAST
4 of 5 series · 30 of 40 positions shown · non-contrast
Comparison: Radiographs 03/24/2019

CLINICAL DATA: Chronic left shoulder pain.

EXAM:
MRI OF THE LEFT SHOULDER WITHOUT CONTRAST
TECHNIQUE: Multiplanar, multisequence MR imaging of the shoulder was performed.
No intravenous contrast was administered.

[Series 5: PD fat-sat · axial · left · 4.0mm · 0.55mm/px · z∈[-47,+81]mm · 8 of 28 slices shown (1 of 2)]
[im 1/28]
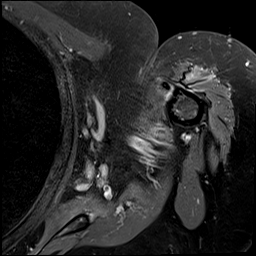
[im 4/28]
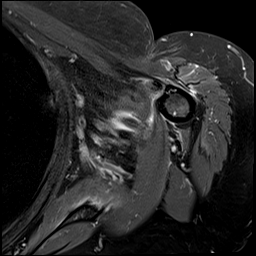
[im 8/28]
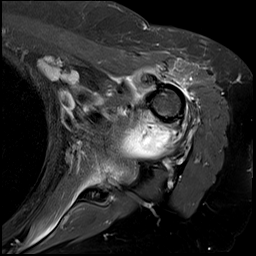
[im 12/28]
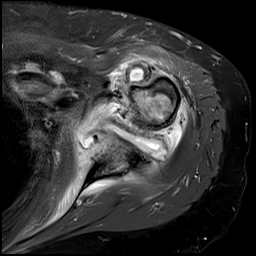
[im 16/28]
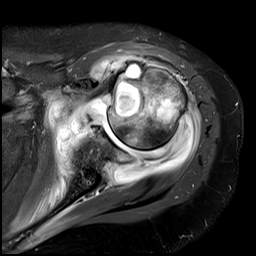
[im 20/28]
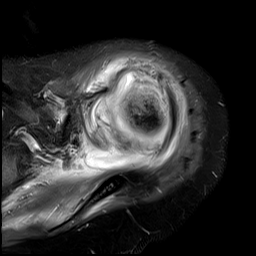
[im 24/28]
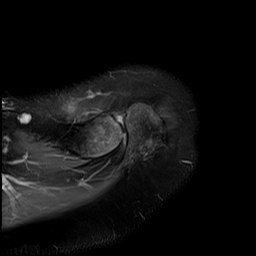
[im 28/28]
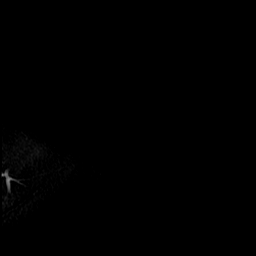

[Series 6: PD fat-sat · oblique · left · 4.0mm · 0.44mm/px · 7 of 26 slices shown (2 of 2)]
[im 1/26]
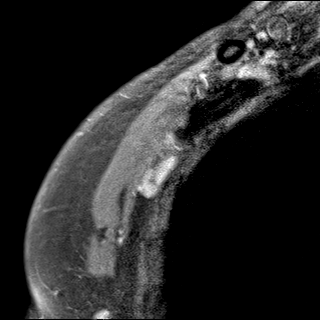
[im 5/26]
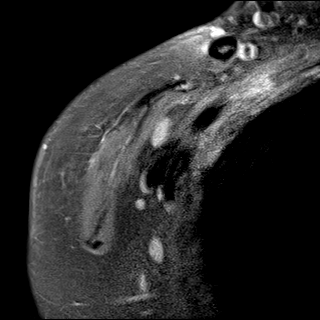
[im 9/26]
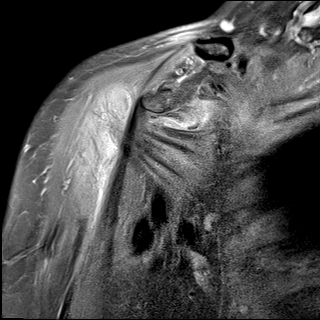
[im 13/26]
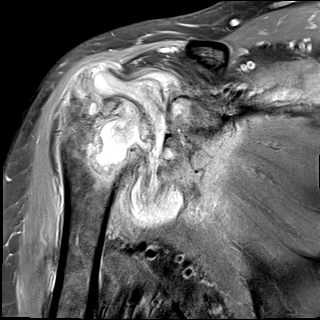
[im 17/26]
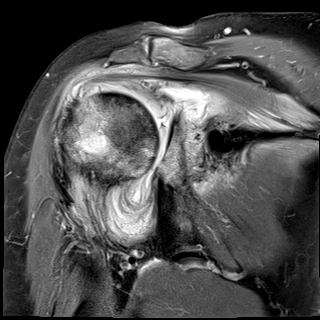
[im 21/26]
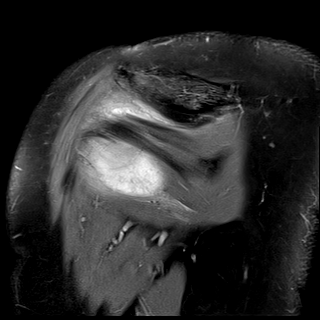
[im 26/26]
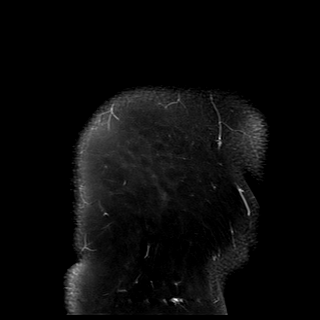

[Series 7: T2 fat-sat · oblique · left · 4.0mm · 0.44mm/px · 7 of 26 slices shown (1 of 2)]
[im 1/26]
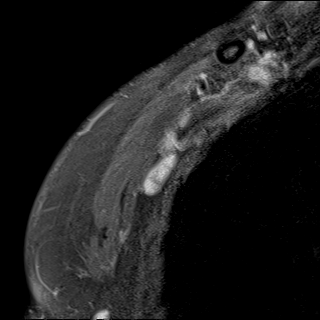
[im 5/26]
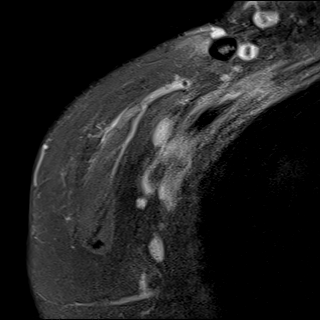
[im 9/26]
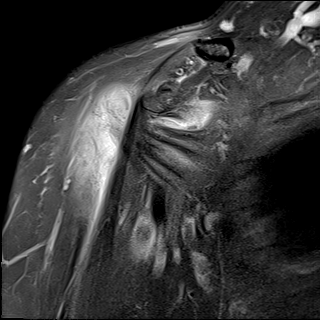
[im 13/26]
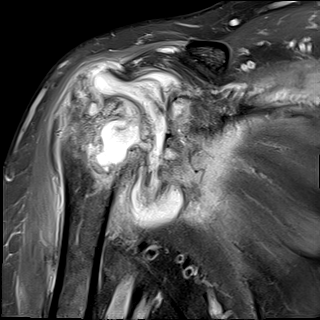
[im 17/26]
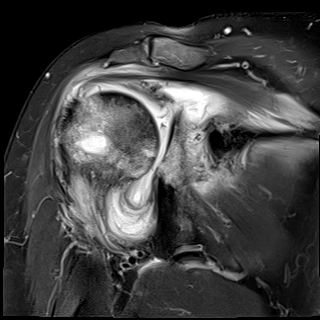
[im 21/26]
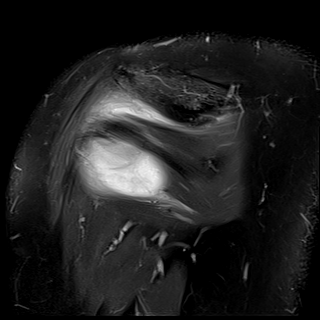
[im 26/26]
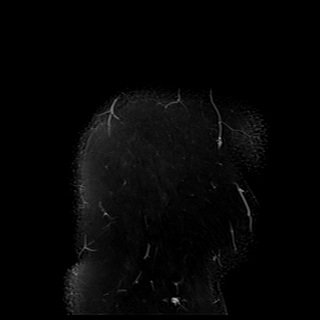

[Series 8: T2 fat-sat · oblique · left · 4.0mm · 0.23mm/px · 8 of 31 slices shown (2 of 2)]
[im 1/31]
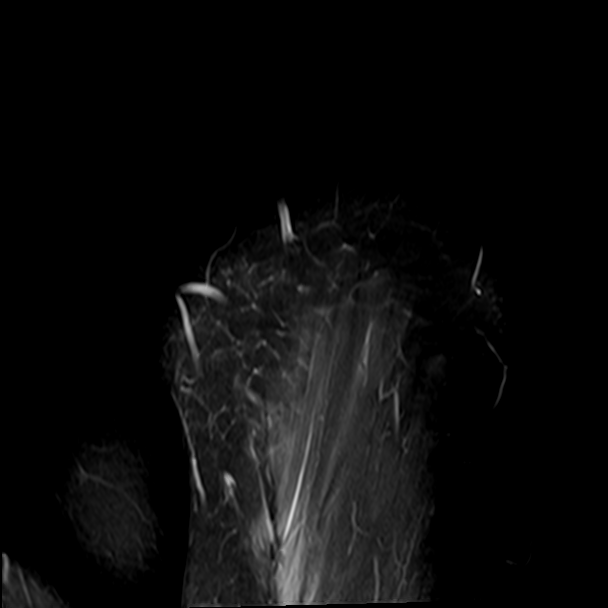
[im 4/31]
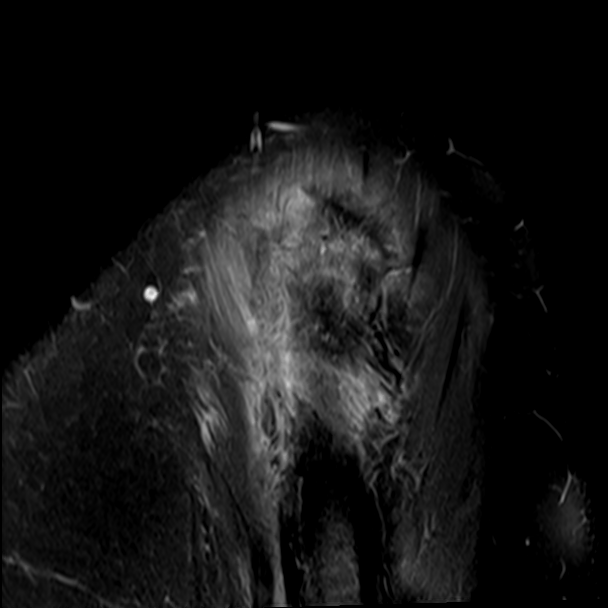
[im 8/31]
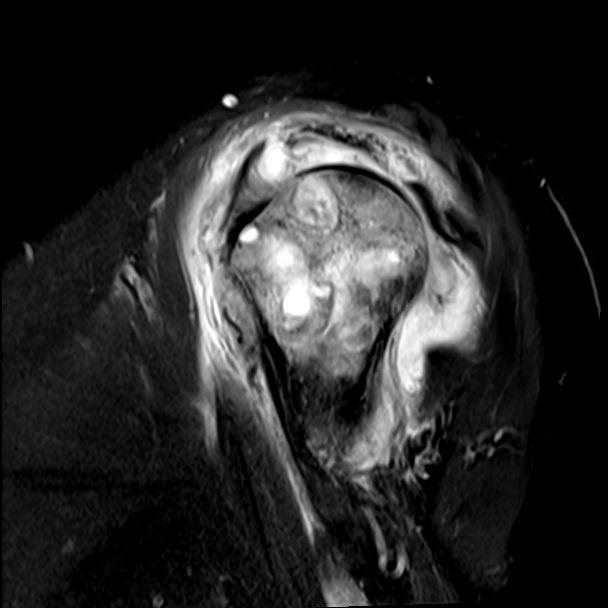
[im 12/31]
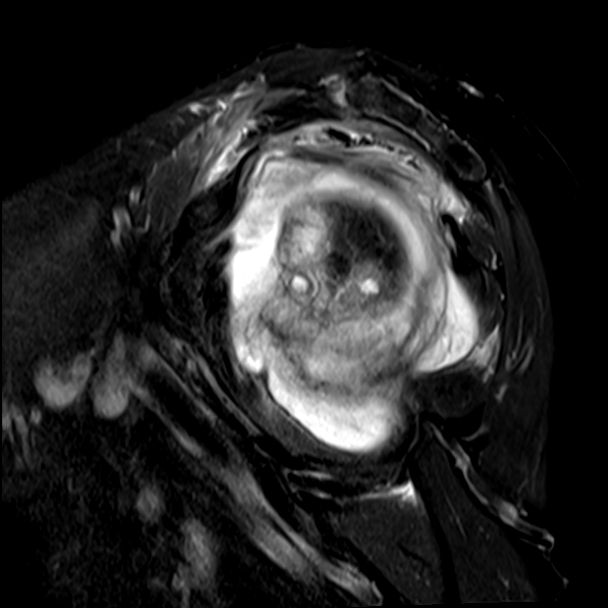
[im 16/31]
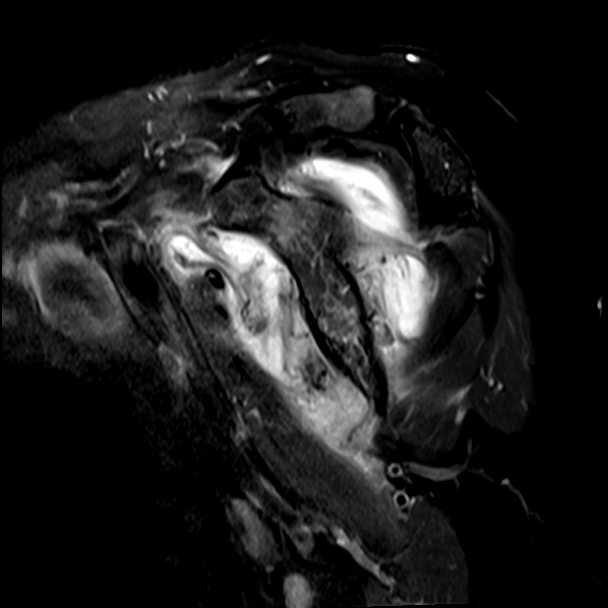
[im 19/31]
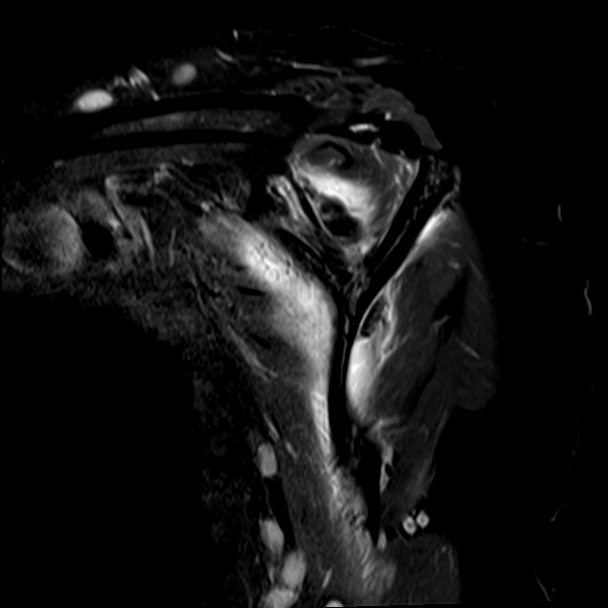
[im 23/31]
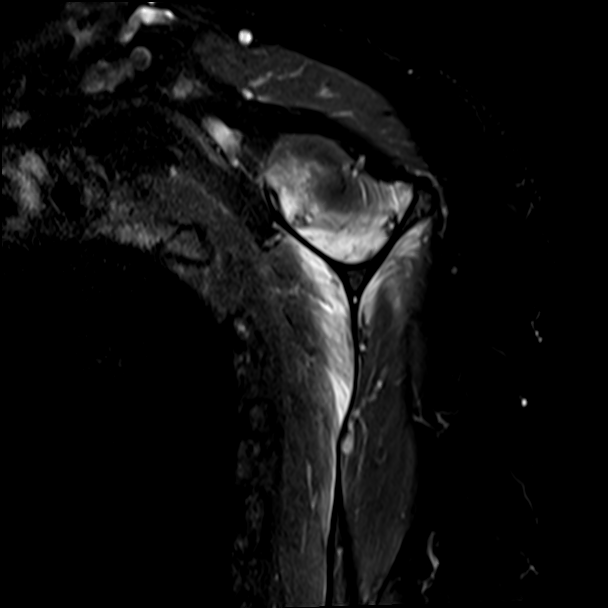
[im 27/31]
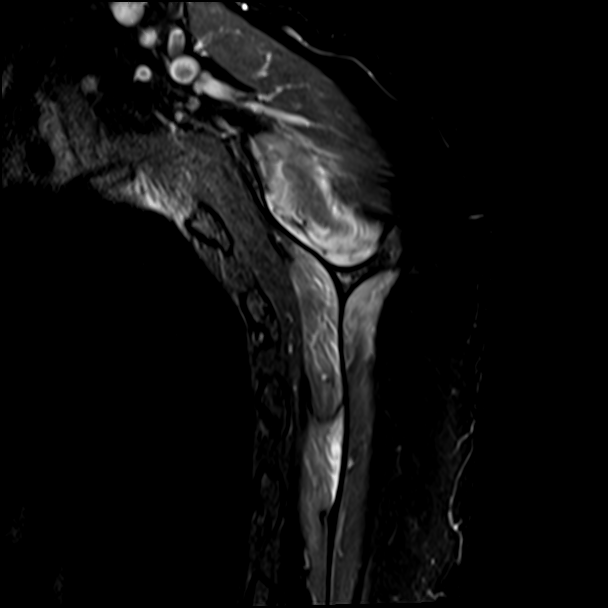

[30 of 40 positions shown; findings below may reference images not displayed]

FINDINGS: Rotator cuff: Severe rotator cuff tendinopathy/tendinosis which is
probably septic related. No obvious full-thickness tear.

Muscles: Extensive edema like signal abnormality extending back into
the rotator cuff muscles likely septic myositis.

Biceps long head: Markedly thickened and demonstrating diffuse
fluid-like signal intensity likely septic tendinopathy with large
intrasubstance tear containing fluid/pus.

Acromioclavicular Joint: Intact. Type 2 acromion. No lateral
downsloping.

Glenohumeral Joint: MR findings highly suspicious for septic
arthritis with a large complex joint effusion, severe synovitis and
associated osteomyelitis involving the humerus and glenoid with bone
abscesses. The articular cartilage is likely destroyed.

Labrum:  Severely degenerated and torn.

Bones: Extensive marrow edema and multiloculated fluid collections
in the humeral head and glenoid consistent with osteomyelitis and
bone abscesses.

Other: Septic subacromial/subdeltoid bursitis.
IMPRESSION: 1. MR findings most consistent with severe septic arthritis, septic
rotator cuff disease, septic myositis and septic biceps tendinitis.
2. Osteomyelitis involving the humeral head and glenoid with bone
abscesses.

These results will be called to the ordering clinician or
representative by the Radiologist Assistant, and communication
documented in the PACS or zVision Dashboard.

## 2019-09-05 IMAGING — DX LEFT SHOULDER - 1 VIEW
1 series · 2 of 2 positions shown · non-contrast
Comparison: Shoulder MRI 04/18/2019, shoulder radiograph 03/24/2019

CLINICAL DATA: Post shoulder surgery

EXAM:
LEFT SHOULDER - 1 VIEW

[Series 1: shoulder · 0.14mm/px · 2 of 2 slices shown]
[im 1/2]
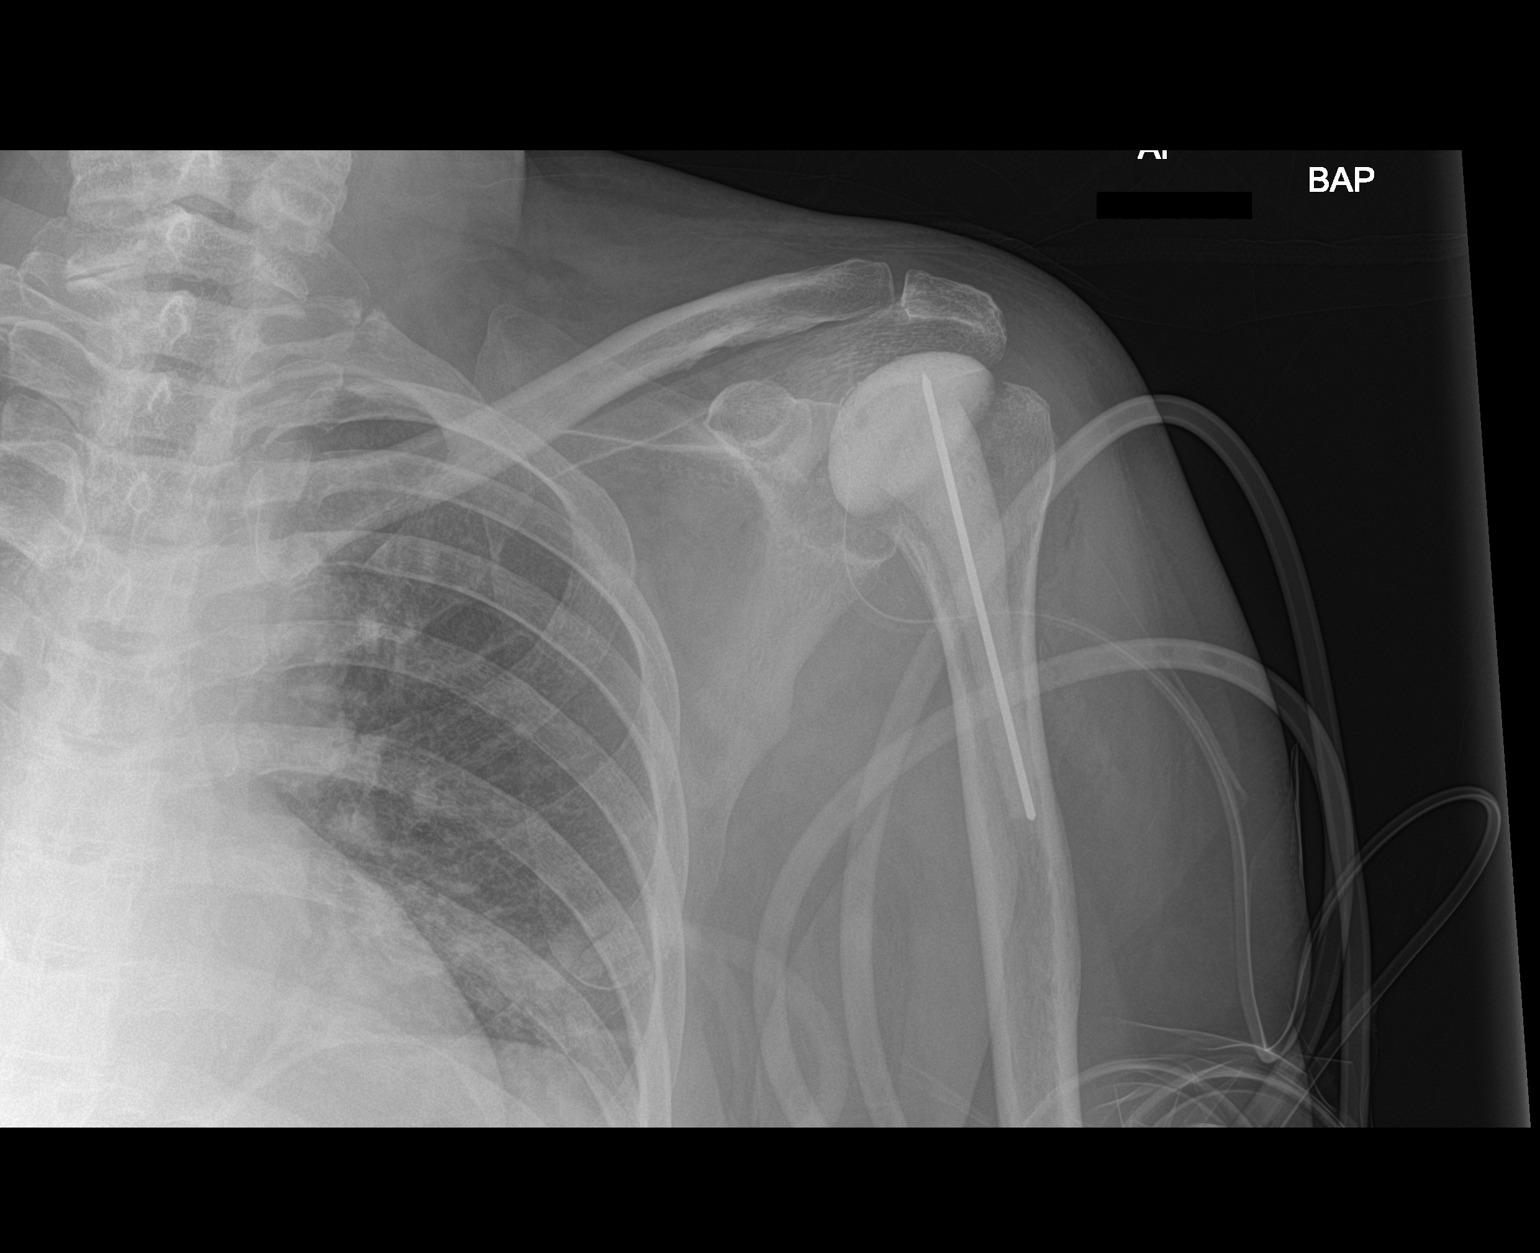
[im 2/2]
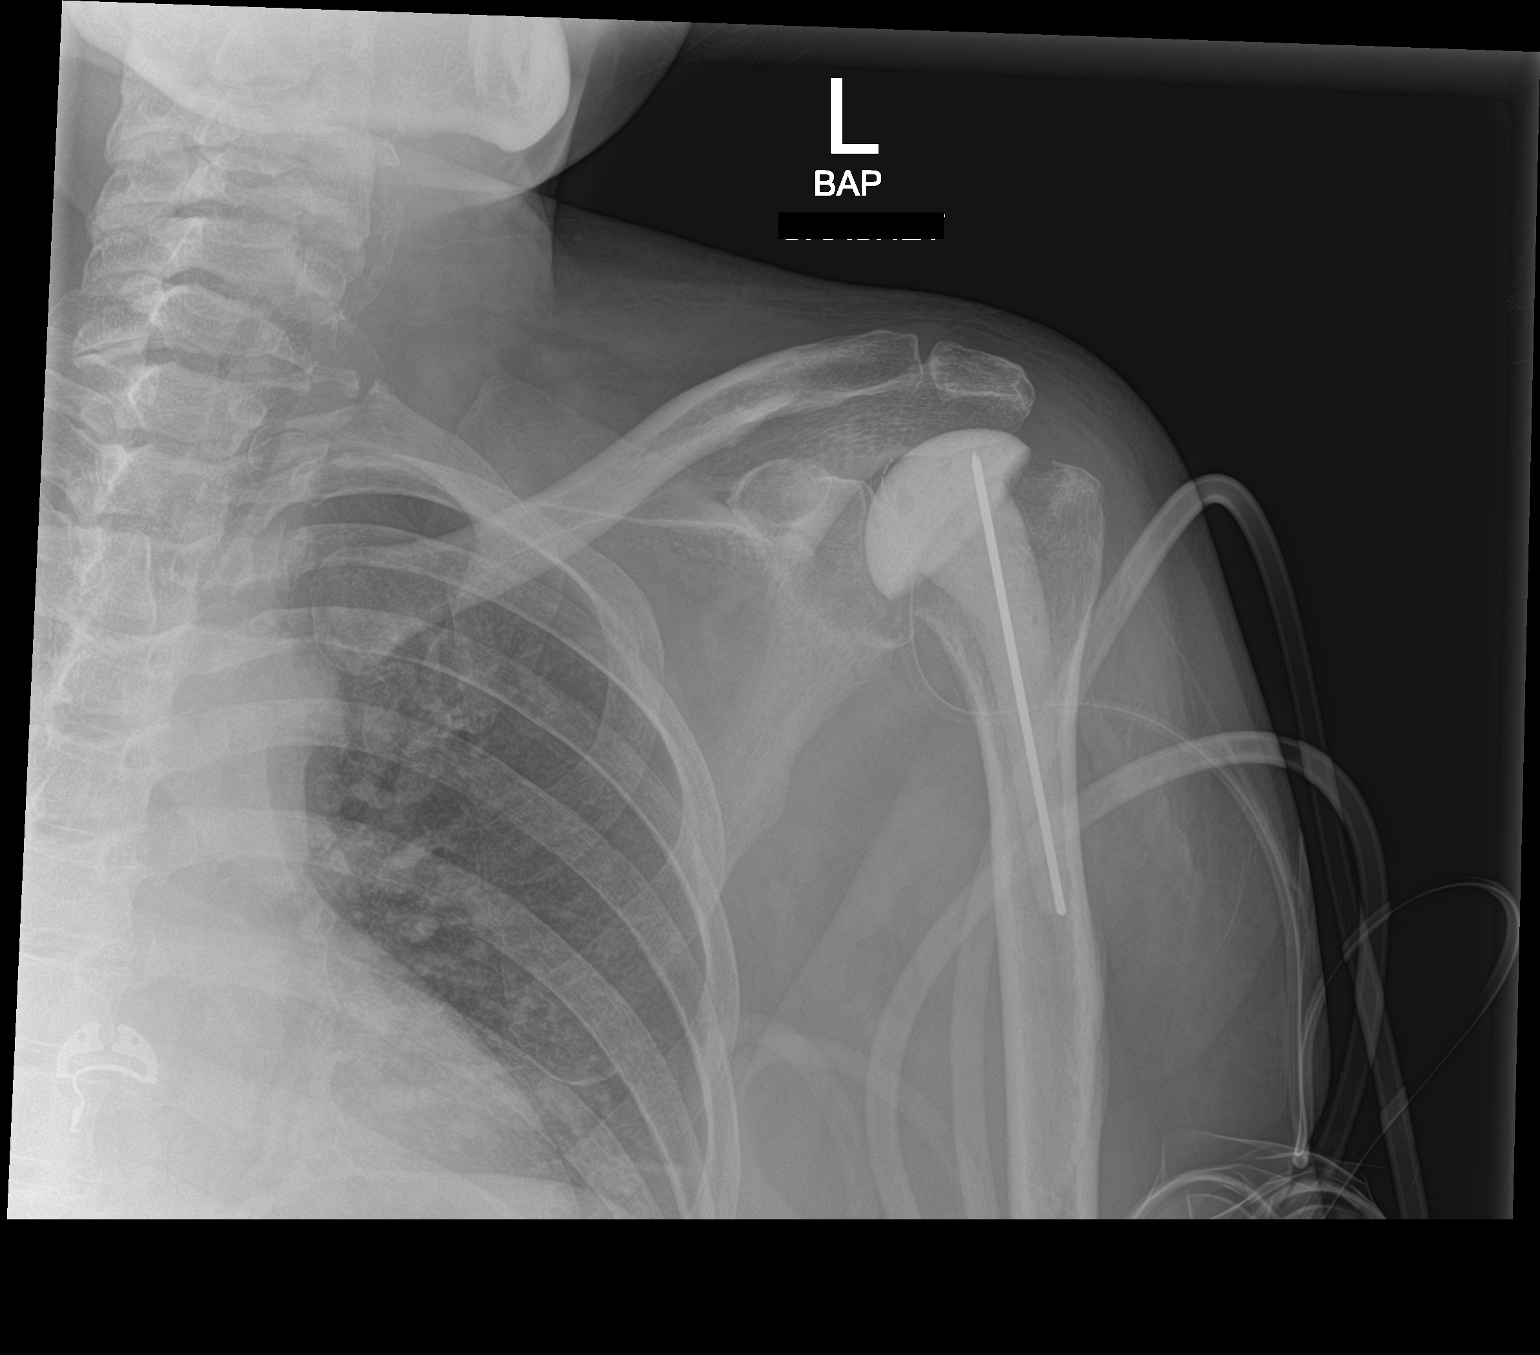

[2 of 2 positions shown; findings below may reference images not displayed]

FINDINGS: Patient is now post resection shaft of the left humeral head with
left shoulder hemiarthroplasty. A left humeral head prosthesis is in
place transfixed by metallic pin. Surgical drains are noted. Normal
expected alignment. No other bony destructive changes. No unexpected
foreign body.
IMPRESSION: Expected postoperative appearance without acute postoperative
complication.

## 2019-09-06 ENCOUNTER — Encounter: Payer: Self-pay | Admitting: Oncology

## 2019-09-07 ENCOUNTER — Other Ambulatory Visit: Payer: Self-pay

## 2019-09-10 ENCOUNTER — Inpatient Hospital Stay: Payer: Medicaid Other | Attending: Oncology

## 2019-09-13 ENCOUNTER — Other Ambulatory Visit: Payer: Self-pay | Admitting: Orthopedic Surgery

## 2019-10-03 ENCOUNTER — Other Ambulatory Visit: Payer: Self-pay | Admitting: Infectious Diseases

## 2019-10-03 DIAGNOSIS — B20 Human immunodeficiency virus [HIV] disease: Secondary | ICD-10-CM

## 2019-10-03 MED ORDER — BIKTARVY 50-200-25 MG PO TABS: 1.0000 | ORAL_TABLET | Freq: Every day | ORAL | 5 refills | Status: DC

## 2019-10-12 NOTE — Patient Instructions (Addendum)
DUE TO COVID-19 ONLY ONE VISITOR IS ALLOWED TO COME WITH YOU AND STAY IN THE WAITING ROOM ONLY DURING PRE OP AND PROCEDURE DAY OF SURGERY. THE 1 VISITOR MAY VISIT WITH YOU AFTER SURGERY IN YOUR PRIVATE ROOM DURING VISITING HOURS ONLY!  YOU NEED TO HAVE A COVID 19 TEST ON_1/11______ @_8 :35______, THIS TEST MUST BE DONE BEFORE SURGERY, COME  Alicia Lynch , [id number].  ([place]) ONCE YOUR COVID TEST IS COMPLETED, PLEASE BEGIN THE QUARANTINE INSTRUCTIONS AS OUTLINED IN YOUR HANDOUT.                Alicia Lynch    Your procedure is scheduled on: 1/14.21   Report to [facility]  Entrance   Report to Short Stay 5:30 AM     Call this number if you have problems the morning of surgery [phone]    Midnight. BRUSH YOUR TEETH MORNING OF SURGERY AND RINSE YOUR MOUTH OUT, NO CHEWING GUM CANDY OR MINTS.    Do not eat food After Midnight.   YOU MAY HAVE CLEAR LIQUIDS FROM MIDNIGHT UNTIL 4:30 AM.   At 4:30 AM Please finish the prescribed Pre-Surgery  drink.   Nothing by mouth after you finish the  drink !   Take these medicines the morning of surgery with A SIP OF WATER: Buspirone, Gabapentin, Wellbutrin, Metoprolol, use your inhalers and bring them with you to the hospital  DO NOT Alicia      How to Manage Your Diabetes Before and After Surgery  Why is it important to control my blood sugar before and after surgery? . Improving blood sugar levels before and after surgery helps healing and can limit problems. . A way of improving blood sugar control is eating a healthy diet by: o  Eating less sugar and carbohydrates o  Increasing activity/exercise o  Talking with your doctor about reaching your blood sugar goals . High blood sugars (greater than 180 mg/dL) can raise your risk of infections and slow your recovery, so you will need to focus on controlling your diabetes during the weeks before  surgery. . Make sure that the doctor who takes care of your diabetes knows about your planned surgery including the date and location.  How do I manage my blood sugar before surgery? . Check your blood sugar at least 4 times a day, starting 2 days before surgery, to make sure that the level is not too high or low. o Check your blood sugar the morning of your surgery when you wake up and every 2 hours until you get to the Short Stay unit. . If your blood sugar is less than 70 mg/dL, you will need to treat for low blood sugar: o Do not take insulin. o Treat a low blood sugar (less than 70 mg/dL) with  cup of clear juice (cranberry or apple), 4 glucose tablets, OR glucose gel. o Recheck blood sugar in 15 minutes after treatment (to make sure it is greater than 70 mg/dL). If your blood sugar is not greater than 70 mg/dL on recheck, call [phone] for further instructions. . Report your blood sugar to the short stay nurse when you get to Short Stay.  . If you are admitted to the hospital after surgery: o Your blood sugar will be checked by the staff and you will probably be given insulin after surgery (instead of oral diabetes medicines) to make sure you have good blood  sugar levels. o The goal for blood sugar control after surgery is 80-180 mg/dL.   WHAT DO I DO ABOUT MY DIABETES MEDICATION?  Marland Kitchen Do not take oral diabetes medicines (pills) the morning of surgery.                You may not have any metal on your body including hair pins and              piercings  Do not wear jewelry, make-up, lotions, powders or perfumes, deodorant             Do not wear nail polish on your fingernails.  Do not shave  48 hours prior to surgery.     Do not bring valuables to the hospital. [place].  Contacts, dentures or bridgework may not be worn into surgery.    Name and phone number of your driver:  Special Instructions: N/A              Please  read over the following fact sheets you were given: _____________________________________________________________________             [facility] - Preparing for Surgery  Before surgery, you can play an important role.   Because skin is not sterile, your skin needs to be as free of germs as possible.   You can reduce the number of germs on your skin by washing with CHG (chlorahexidine gluconate) soap before surgery.   CHG is an antiseptic cleaner which kills germs and bonds with the skin to continue killing germs even after washing. Please DO NOT use if you have an allergy to CHG or antibacterial soaps.   If your skin becomes reddened/irritated stop using the CHG and inform your nurse when you arrive at Short Stay. Do not shave (including legs and underarms) for at least 48 hours prior to the first CHG shower.   . Please follow these instructions carefully:  1.  Shower with CHG Soap the night before surgery and the  morning of Surgery.  2.  If you choose to wash your hair, wash your hair first as usual with your  normal  shampoo.  3.  After you shampoo, rinse your hair and body thoroughly to remove the  shampoo.                                        4.  Use CHG as you would any other liquid soap.  You can apply chg directly  to the skin and wash                       Gently with a scrungie or clean washcloth.  5.  Apply the CHG Soap to your body ONLY FROM THE NECK DOWN.   Do not use on face/ open                           Wound or open sores. Avoid contact with eyes, ears mouth and genitals (private parts).                       Wash face,  Genitals (private parts) with your normal soap.  6.  Wash thoroughly, paying special attention to the area where your surgery  will be performed.  7.  Thoroughly rinse your body with warm water from the neck down.  8.  DO NOT shower/wash with your normal soap after using and rinsing off  the CHG Soap.             9.  Pat yourself dry with a  clean towel.            10.  Wear clean pajamas.            11.  Place clean sheets on your bed the night of your first shower and do not  sleep with pets. Day of Surgery : Do not apply any lotions/deodorants the morning of surgery.  Please wear clean clothes to the hospital/surgery center.  FAILURE TO FOLLOW THESE INSTRUCTIONS MAY RESULT IN THE CANCELLATION OF YOUR SURGERY PATIENT SIGNATURE_________________________________  NURSE SIGNATURE__________________________________  ________________________________________________________________________   Alicia Lynch  An incentive spirometer is a tool that can help keep your lungs clear and active. This tool measures how well you are filling your lungs with each breath. Taking long deep breaths may help reverse or decrease the chance of developing breathing (pulmonary) problems (especially infection) following:  A long period of time when you are unable to move or be active. BEFORE THE PROCEDURE   If the spirometer includes an indicator to show your best effort, your nurse or respiratory therapist will set it to a desired goal.  If possible, sit up straight or lean slightly forward. Try not to slouch.  Hold the incentive spirometer in an upright position. INSTRUCTIONS FOR USE  1. Sit on the edge of your bed if possible, or sit up as far as you can in bed or on a chair. 2. Hold the incentive spirometer in an upright position. 3. Breathe out normally. 4. Place the mouthpiece in your mouth and seal your lips tightly around it. 5. Breathe in slowly and as deeply as possible, raising the piston or the ball toward the top of the column. 6. Hold your breath for 3-5 seconds or for as long as possible. Allow the piston or ball to fall to the bottom of the column. 7. Remove the mouthpiece from your mouth and breathe out normally. 8. Rest for a few seconds and repeat Steps 1 through 7 at least 10 times every 1-2 hours when you are awake.  Take your time and take a few normal breaths between deep breaths. 9. The spirometer may include an indicator to show your best effort. Use the indicator as a goal to work toward during each repetition. 10. After each set of 10 deep breaths, practice coughing to be sure your lungs are clear. If you have an incision (the cut made at the time of surgery), support your incision when coughing by placing a pillow or rolled up towels firmly against it. Once you are able to get out of bed, walk around indoors and cough well. You may stop using the incentive spirometer when instructed by your caregiver.  RISKS AND COMPLICATIONS  Take your time so you do not get dizzy or light-headed.  If you are in pain, you may need to take or ask for pain medication before doing incentive spirometry. It is harder to take a deep breath if you are having pain. AFTER USE  Rest and breathe slowly and easily.  It can be helpful to keep track of a log of your progress. Your  caregiver can provide you with a simple table to help with this. If you are using the spirometer at home, follow these instructions: [place] IF:   You are having difficultly using the spirometer.  You have trouble using the spirometer as often as instructed.  Your pain medication is not giving enough relief while using the spirometer.  You develop fever of 100.5 F (38.1 C) or higher. SEEK IMMEDIATE MEDICAL CARE IF:   You cough up bloody sputum that had not been present before.  You develop fever of 102 F (38.9 C) or greater.  You develop worsening pain at or near the incision site. MAKE SURE YOU:   Understand these instructions.  Will watch your condition.  Will get help right away if you are not doing well or get worse. Document Released: 01/31/2007 Document Revised: 12/13/2011 Document Reviewed: 04/03/2007 [facility] Patient Information 2014 [place],  [place].   ________________________________________________________________________

## 2019-10-15 ENCOUNTER — Encounter (HOSPITAL_COMMUNITY)
Admission: RE | Admit: 2019-10-15 | Discharge: 2019-10-15 | Disposition: A | Discharge status: Home / Self Care | Payer: Medicaid Other | Source: Ambulatory Visit | Attending: Orthopedic Surgery | Admitting: Orthopedic Surgery

## 2019-10-15 ENCOUNTER — Encounter (HOSPITAL_COMMUNITY): Payer: Self-pay

## 2019-10-15 ENCOUNTER — Other Ambulatory Visit: Payer: Self-pay

## 2019-10-15 ENCOUNTER — Other Ambulatory Visit (HOSPITAL_COMMUNITY)
Admission: RE | Admit: 2019-10-15 | Discharge: 2019-10-15 | Disposition: A | Discharge status: Home / Self Care | Payer: Medicaid Other | Source: Ambulatory Visit | Attending: Orthopedic Surgery | Admitting: Orthopedic Surgery

## 2019-10-15 ENCOUNTER — Ambulatory Visit (HOSPITAL_COMMUNITY)
Admission: RE | Admit: 2019-10-15 | Discharge: 2019-10-15 | Disposition: A | Discharge status: Home / Self Care | Payer: Medicaid Other | Source: Ambulatory Visit | Attending: Orthopedic Surgery | Admitting: Orthopedic Surgery

## 2019-10-15 DIAGNOSIS — Z01811 Encounter for preprocedural respiratory examination: Secondary | ICD-10-CM

## 2019-10-15 DIAGNOSIS — Z01818 Encounter for other preprocedural examination: Secondary | ICD-10-CM | POA: Diagnosis not present

## 2019-10-15 DIAGNOSIS — Z01812 Encounter for preprocedural laboratory examination: Secondary | ICD-10-CM | POA: Diagnosis present

## 2019-10-15 DIAGNOSIS — M00812 Arthritis due to other bacteria, left shoulder: Secondary | ICD-10-CM | POA: Insufficient documentation

## 2019-10-15 DIAGNOSIS — Z20822 Contact with and (suspected) exposure to covid-19: Secondary | ICD-10-CM | POA: Diagnosis not present

## 2019-10-15 HISTORY — DX: Human immunodeficiency virus (HIV) disease: B20

## 2019-10-15 HISTORY — DX: Asymptomatic human immunodeficiency virus (hiv) infection status: Z21

## 2019-10-15 LAB — CBC WITH DIFFERENTIAL/PLATELET
Abs Immature Granulocytes: 0.02 10*3/uL (ref 0.00–0.07)
Basophils Absolute: 0 10*3/uL (ref 0.0–0.1)
Basophils Relative: 1 %
Eosinophils Absolute: 0.1 10*3/uL (ref 0.0–0.5)
Eosinophils Relative: 3 %
HCT: 41.1 % (ref 36.0–46.0)
Hemoglobin: 13.2 g/dL (ref 12.0–15.0)
Immature Granulocytes: 1 %
Lymphocytes Relative: 50 %
Lymphs Abs: 1.5 10*3/uL (ref 0.7–4.0)
MCH: 28.7 pg (ref 26.0–34.0)
MCHC: 32.1 g/dL (ref 30.0–36.0)
MCV: 89.3 fL (ref 80.0–100.0)
Monocytes Absolute: 0.3 10*3/uL (ref 0.1–1.0)
Monocytes Relative: 9 %
Neutro Abs: 1.1 10*3/uL — ABNORMAL LOW (ref 1.7–7.7)
Neutrophils Relative %: 36 %
Platelets: 211 10*3/uL (ref 150–400)
RBC: 4.6 MIL/uL (ref 3.87–5.11)
RDW: 14.6 % (ref 11.5–15.5)
WBC: 3 10*3/uL — ABNORMAL LOW (ref 4.0–10.5)
nRBC: 0 % (ref 0.0–0.2)

## 2019-10-15 LAB — URINALYSIS, ROUTINE W REFLEX MICROSCOPIC
Bilirubin Urine: NEGATIVE
Glucose, UA: NEGATIVE mg/dL
Ketones, ur: NEGATIVE mg/dL
Nitrite: NEGATIVE
Protein, ur: NEGATIVE mg/dL
Specific Gravity, Urine: 1.003 — ABNORMAL LOW (ref 1.005–1.030)
pH: 6 (ref 5.0–8.0)

## 2019-10-15 LAB — COMPREHENSIVE METABOLIC PANEL
ALT: 9 U/L (ref 0–44)
AST: 12 U/L — ABNORMAL LOW (ref 15–41)
Albumin: 3.8 g/dL (ref 3.5–5.0)
Alkaline Phosphatase: 60 U/L (ref 38–126)
Anion gap: 9 (ref 5–15)
BUN: 17 mg/dL (ref 6–20)
CO2: 25 mmol/L (ref 22–32)
Calcium: 10 mg/dL (ref 8.9–10.3)
Chloride: 107 mmol/L (ref 98–111)
Creatinine, Ser: 1.03 mg/dL — ABNORMAL HIGH (ref 0.44–1.00)
GFR calc Af Amer: >60 mL/min (ref >60)
GFR calc non Af Amer: >60 mL/min (ref >60)
Glucose, Bld: 97 mg/dL (ref 70–99)
Potassium: 4.3 mmol/L (ref 3.5–5.1)
Sodium: 141 mmol/L (ref 135–145)
Total Bilirubin: 0.7 mg/dL (ref 0.3–1.2)
Total Protein: 8.4 g/dL — ABNORMAL HIGH (ref 6.5–8.1)

## 2019-10-15 LAB — PROTIME-INR
INR: 0.9 (ref 0.8–1.2)
Prothrombin Time: 12.4 seconds (ref 11.4–15.2)

## 2019-10-15 LAB — ABO/RH: ABO/RH(D): B POS

## 2019-10-15 LAB — APTT: aPTT: 31 seconds (ref 24–36)

## 2019-10-15 LAB — SURGICAL PCR SCREEN
MRSA, PCR: NEGATIVE
Staphylococcus aureus: POSITIVE — AB

## 2019-10-15 LAB — GLUCOSE, CAPILLARY: Glucose-Capillary: 97 mg/dL (ref 70–99)

## 2019-10-15 NOTE — Progress Notes (Signed)
PCP - Dr. Jerilynn Mages. Brunetta Genera Cardiologist - none Infectious disease- Dr. Mauricia Area. 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 On antiviral meds  Chest x-ray - 10/15/19 EKG - 02/06/19 Stress Test - no ECHO - no Cardiac Cath - no  Sleep Study - no CPAP - no  Fasting Blood Sugar - NA Checks Blood Sugar _____ times a day  Blood Thinner Instructions:NA Aspirin Instructions: Last Dose:  Anesthesia review:   Patient denies shortness of breath, fever, cough and chest pain at PAT appointment yes  Patient verbalized understanding of instructions that were given to them at the PAT appointment. Patient was also instructed that they will need to review over the PAT instructions again at home before surgery. yes

## 2019-10-16 LAB — NOVEL CORONAVIRUS, NAA (HOSP ORDER, SEND-OUT TO REF LAB; TAT 18-24 HRS): SARS-CoV-2, NAA: NOT DETECTED

## 2019-10-17 NOTE — Anesthesia Preprocedure Evaluation (Addendum)
Anesthesia Evaluation  Patient identified by MRN, date of birth, ID band Patient awake    Reviewed: Allergy & Precautions, NPO status , Patient's Chart, lab work & pertinent test results  Airway Mallampati: I  TM Distance: >3 FB Neck ROM: Full    Dental no notable dental hx. (+) Edentulous Upper, Edentulous Lower   Pulmonary COPD,  COPD inhaler,    Pulmonary exam normal breath sounds clear to auscultation       Cardiovascular hypertension, Pt. on medications and Pt. on home beta blockers Normal cardiovascular exam Rhythm:Regular Rate:Normal     Neuro/Psych negative psych ROS   GI/Hepatic GERD  ,  Endo/Other    Renal/GU K+ 4.3 Cr 1.03     Musculoskeletal   Abdominal (+) + obese,   Peds  Hematology  (+) HIV, Hgb 13.0 Plt 211   Anesthesia Other Findings   Reproductive/Obstetrics                            Anesthesia Physical Anesthesia Plan  ASA: III  Anesthesia Plan: General   Post-op Pain Management:  Regional for Post-op pain   Induction: Intravenous  PONV Risk Score and Plan: 3 and Treatment may vary due to age or medical condition, Dexamethasone, Midazolam and Promethazine  Airway Management Planned: Oral ETT  Additional Equipment:   Intra-op Plan:   Post-operative Plan: Extubation in OR  Informed Consent: I have reviewed the patients History and Physical, chart, labs and discussed the procedure including the risks, benefits and alternatives for the proposed anesthesia with the patient or authorized representative who has indicated his/her understanding and acceptance.     Dental advisory given  Plan Discussed with:   Anesthesia Plan Comments: (GA w L ISB)       Anesthesia Quick Evaluation

## 2019-10-18 ENCOUNTER — Inpatient Hospital Stay (HOSPITAL_COMMUNITY): Payer: Medicaid Other | Admitting: Physician Assistant

## 2019-10-18 ENCOUNTER — Ambulatory Visit (HOSPITAL_COMMUNITY)
Admission: RE | Admit: 2019-10-18 | Discharge: 2019-10-18 | Disposition: A | Discharge status: Home / Self Care | Payer: Medicaid Other | Attending: Orthopedic Surgery | Admitting: Orthopedic Surgery

## 2019-10-18 ENCOUNTER — Other Ambulatory Visit: Payer: Self-pay

## 2019-10-18 ENCOUNTER — Encounter (HOSPITAL_COMMUNITY): Payer: Self-pay | Admitting: Orthopedic Surgery

## 2019-10-18 ENCOUNTER — Inpatient Hospital Stay (HOSPITAL_COMMUNITY): Payer: Medicaid Other

## 2019-10-18 ENCOUNTER — Encounter (HOSPITAL_COMMUNITY)
Admission: RE | Disposition: A | Discharge status: Home / Self Care | Payer: Self-pay | Source: Home / Self Care | Attending: Orthopedic Surgery

## 2019-10-18 DIAGNOSIS — Z7951 Long term (current) use of inhaled steroids: Secondary | ICD-10-CM | POA: Insufficient documentation

## 2019-10-18 DIAGNOSIS — Z21 Asymptomatic human immunodeficiency virus [HIV] infection status: Secondary | ICD-10-CM | POA: Diagnosis not present

## 2019-10-18 DIAGNOSIS — Z96612 Presence of left artificial shoulder joint: Secondary | ICD-10-CM

## 2019-10-18 DIAGNOSIS — M25512 Pain in left shoulder: Secondary | ICD-10-CM | POA: Diagnosis present

## 2019-10-18 DIAGNOSIS — J449 Chronic obstructive pulmonary disease, unspecified: Secondary | ICD-10-CM | POA: Diagnosis not present

## 2019-10-18 DIAGNOSIS — Z79899 Other long term (current) drug therapy: Secondary | ICD-10-CM | POA: Insufficient documentation

## 2019-10-18 DIAGNOSIS — I10 Essential (primary) hypertension: Secondary | ICD-10-CM | POA: Insufficient documentation

## 2019-10-18 DIAGNOSIS — D649 Anemia, unspecified: Secondary | ICD-10-CM | POA: Insufficient documentation

## 2019-10-18 HISTORY — PX: TOTAL SHOULDER REVISION: SHX6130

## 2019-10-18 LAB — TYPE AND SCREEN
ABO/RH(D): B POS
Antibody Screen: NEGATIVE

## 2019-10-18 SURGERY — REVISION, TOTAL ARTHROPLASTY, SHOULDER
Anesthesia: General | Site: Shoulder | Laterality: Left

## 2019-10-18 MED ORDER — PROPOFOL 10 MG/ML IV BOLUS
INTRAVENOUS | Status: AC
  Filled 2019-10-18: qty 20

## 2019-10-18 MED ORDER — FENTANYL CITRATE (PF) 250 MCG/5ML IJ SOLN
INTRAMUSCULAR | Status: DC | PRN
  Administered 2019-10-18: 50 ug via INTRAVENOUS
  Administered 2019-10-18 (×2): 100 ug via INTRAVENOUS

## 2019-10-18 MED ORDER — MIDAZOLAM HCL 2 MG/2ML IJ SOLN
INTRAMUSCULAR | Status: AC
  Filled 2019-10-18: qty 2

## 2019-10-18 MED ORDER — OXYCODONE HCL 5 MG/5ML PO SOLN: 5.0000 mg | Freq: Once | ORAL | Status: DC | PRN

## 2019-10-18 MED ORDER — OXYCODONE-ACETAMINOPHEN 5-325 MG PO TABS: ORAL_TABLET | ORAL | 0 refills | Status: DC

## 2019-10-18 MED ORDER — HYDROMORPHONE HCL 1 MG/ML IJ SOLN
0.2500 mg | INTRAMUSCULAR | Status: DC | PRN
  Administered 2019-10-18 (×2): 0.5 mg via INTRAVENOUS

## 2019-10-18 MED ORDER — STERILE WATER FOR IRRIGATION IR SOLN
Status: DC | PRN
  Administered 2019-10-18: 1000 mL

## 2019-10-18 MED ORDER — ONDANSETRON HCL 4 MG/2ML IJ SOLN
INTRAMUSCULAR | Status: DC | PRN
  Administered 2019-10-18: 4 mg via INTRAVENOUS

## 2019-10-18 MED ORDER — KETOROLAC TROMETHAMINE 30 MG/ML IJ SOLN: 30.0000 mg | Freq: Once | INTRAMUSCULAR | Status: DC | PRN

## 2019-10-18 MED ORDER — ONDANSETRON HCL 4 MG/2ML IJ SOLN
INTRAMUSCULAR | Status: AC
  Filled 2019-10-18: qty 2

## 2019-10-18 MED ORDER — TIZANIDINE HCL 4 MG PO TABS: 4.0000 mg | ORAL_TABLET | Freq: Three times a day (TID) | ORAL | 1 refills | Status: AC | PRN

## 2019-10-18 MED ORDER — LACTATED RINGERS IV SOLN: INTRAVENOUS | Status: DC

## 2019-10-18 MED ORDER — BUPIVACAINE LIPOSOME 1.3 % IJ SUSP
INTRAMUSCULAR | Status: DC | PRN
  Administered 2019-10-18: 133 mg via PERINEURAL

## 2019-10-18 MED ORDER — HYDROMORPHONE HCL 1 MG/ML IJ SOLN
INTRAMUSCULAR | Status: AC
  Filled 2019-10-18: qty 1

## 2019-10-18 MED ORDER — OXYCODONE HCL 5 MG PO TABS: 5.0000 mg | ORAL_TABLET | Freq: Once | ORAL | Status: DC | PRN

## 2019-10-18 MED ORDER — SUGAMMADEX SODIUM 500 MG/5ML IV SOLN
INTRAVENOUS | Status: DC | PRN
  Administered 2019-10-18: 300 mg via INTRAVENOUS

## 2019-10-18 MED ORDER — CHLORHEXIDINE GLUCONATE 4 % EX LIQD: 60.0000 mL | Freq: Once | CUTANEOUS | Status: DC

## 2019-10-18 MED ORDER — PROPOFOL 10 MG/ML IV BOLUS
INTRAVENOUS | Status: DC | PRN
  Administered 2019-10-18: 120 mg via INTRAVENOUS

## 2019-10-18 MED ORDER — SUGAMMADEX SODIUM 500 MG/5ML IV SOLN: INTRAVENOUS | Status: DC | PRN

## 2019-10-18 MED ORDER — DEXAMETHASONE SODIUM PHOSPHATE 10 MG/ML IJ SOLN
INTRAMUSCULAR | Status: DC | PRN
  Administered 2019-10-18: 10 mg via INTRAVENOUS

## 2019-10-18 MED ORDER — 0.9 % SODIUM CHLORIDE (POUR BTL) OPTIME
TOPICAL | Status: DC | PRN
  Administered 2019-10-18: 08:00:00 2000 mL

## 2019-10-18 MED ORDER — PHENYLEPHRINE HCL-NACL 10-0.9 MG/250ML-% IV SOLN
INTRAVENOUS | Status: DC | PRN
  Administered 2019-10-18: 25 ug/min via INTRAVENOUS

## 2019-10-18 MED ORDER — ROCURONIUM BROMIDE 10 MG/ML (PF) SYRINGE
PREFILLED_SYRINGE | INTRAVENOUS | Status: DC | PRN
  Administered 2019-10-18: 50 mg via INTRAVENOUS

## 2019-10-18 MED ORDER — FENTANYL CITRATE (PF) 250 MCG/5ML IJ SOLN
INTRAMUSCULAR | Status: AC
  Filled 2019-10-18: qty 5

## 2019-10-18 MED ORDER — LIDOCAINE 2% (20 MG/ML) 5 ML SYRINGE
INTRAMUSCULAR | Status: DC | PRN
  Administered 2019-10-18: 100 mg via INTRAVENOUS

## 2019-10-18 MED ORDER — ONDANSETRON HCL 4 MG/2ML IJ SOLN
4.0000 mg | Freq: Once | INTRAMUSCULAR | Status: AC | PRN
  Administered 2019-10-18: 4 mg via INTRAVENOUS

## 2019-10-18 MED ORDER — CEFAZOLIN SODIUM-DEXTROSE 2-4 GM/100ML-% IV SOLN
INTRAVENOUS | Status: AC
  Filled 2019-10-18: qty 100

## 2019-10-18 MED ORDER — TRANEXAMIC ACID-NACL 1000-0.7 MG/100ML-% IV SOLN
INTRAVENOUS | Status: AC
  Filled 2019-10-18: qty 100

## 2019-10-18 MED ORDER — MIDAZOLAM HCL 2 MG/2ML IJ SOLN
INTRAMUSCULAR | Status: DC | PRN
  Administered 2019-10-18: 2 mg via INTRAVENOUS

## 2019-10-18 MED ORDER — BUPIVACAINE HCL (PF) 0.5 % IJ SOLN
INTRAMUSCULAR | Status: DC | PRN
  Administered 2019-10-18: 70 mg via PERINEURAL

## 2019-10-18 MED ORDER — TRANEXAMIC ACID-NACL 1000-0.7 MG/100ML-% IV SOLN
1000.0000 mg | INTRAVENOUS | Status: AC
  Administered 2019-10-18: 1000 mg via INTRAVENOUS

## 2019-10-18 MED ORDER — SODIUM CHLORIDE 0.9 % IR SOLN
Status: DC | PRN
  Administered 2019-10-18: 2000 mL

## 2019-10-18 MED ORDER — CEFAZOLIN SODIUM-DEXTROSE 2-4 GM/100ML-% IV SOLN: 2.0000 g | INTRAVENOUS | Status: DC

## 2019-10-18 MED ORDER — DOXYCYCLINE HYCLATE 50 MG PO CAPS: 100.0000 mg | ORAL_CAPSULE | Freq: Two times a day (BID) | ORAL | 0 refills | Status: AC

## 2019-10-18 SURGICAL SUPPLY — 70 items
BAG ZIPLOCK 12X15 (MISCELLANEOUS) ×3 IMPLANT
BASEPLATE P2 COATD GLND 6.5X30 (Shoulder) ×1 IMPLANT
BIT DRILL 2.5 DIA 127 CALI (BIT) ×3 IMPLANT
BIT DRILL 4 DIA CALIBRATED (BIT) ×3 IMPLANT
CLOSURE STERI-STRIP 1/2X4 (GAUZE/BANDAGES/DRESSINGS) ×1
CLOSURE WOUND 1/2 X4 (GAUZE/BANDAGES/DRESSINGS) ×2
CLSR STERI-STRIP ANTIMIC 1/2X4 (GAUZE/BANDAGES/DRESSINGS) ×2 IMPLANT
CONT SPEC 4OZ CLIKSEAL STRL BL (MISCELLANEOUS) ×3 IMPLANT
COVER SURGICAL LIGHT HANDLE (MISCELLANEOUS) ×3 IMPLANT
COVER WAND RF STERILE (DRAPES) IMPLANT
DRAPE INCISE IOBAN 66X45 STRL (DRAPES) ×3 IMPLANT
DRAPE ORTHO SPLIT 77X108 STRL (DRAPES) ×4
DRAPE POUCH INSTRU U-SHP 10X18 (DRAPES) ×3 IMPLANT
DRAPE SURG 17X11 SM STRL (DRAPES) ×3 IMPLANT
DRAPE SURG ORHT 6 SPLT 77X108 (DRAPES) ×2 IMPLANT
DRAPE U-SHAPE 47X51 STRL (DRAPES) ×3 IMPLANT
DRESSING AQUACEL AG SP 3.5X6 (GAUZE/BANDAGES/DRESSINGS) ×1 IMPLANT
DRSG AQUACEL AG ADV 3.5X10 (GAUZE/BANDAGES/DRESSINGS) ×3 IMPLANT
DRSG AQUACEL AG SP 3.5X6 (GAUZE/BANDAGES/DRESSINGS) ×3
DURAPREP 26ML APPLICATOR (WOUND CARE) ×6 IMPLANT
ELECT BLADE TIP CTD 4 INCH (ELECTRODE) ×3 IMPLANT
ELECT REM PT RETURN 15FT ADLT (MISCELLANEOUS) ×3 IMPLANT
FACESHIELD WRAPAROUND (MASK) ×6 IMPLANT
GLOVE BIO SURGEON STRL SZ7 (GLOVE) ×3 IMPLANT
GLOVE BIO SURGEON STRL SZ7.5 (GLOVE) ×6 IMPLANT
GLOVE BIOGEL PI IND STRL 7.0 (GLOVE) ×1 IMPLANT
GLOVE BIOGEL PI IND STRL 8 (GLOVE) ×1 IMPLANT
GLOVE BIOGEL PI INDICATOR 7.0 (GLOVE) ×2
GLOVE BIOGEL PI INDICATOR 8 (GLOVE) ×2
GOWN STRL REUS W/TWL LRG LVL3 (GOWN DISPOSABLE) ×3 IMPLANT
GOWN STRL REUS W/TWL XL LVL3 (GOWN DISPOSABLE) ×3 IMPLANT
HANDPIECE INTERPULSE COAX TIP (DISPOSABLE) ×2
HEMOSTAT SURGICEL 2X14 (HEMOSTASIS) IMPLANT
HOOD PEEL AWAY FLYTE STAYCOOL (MISCELLANEOUS) ×6 IMPLANT
INSERT SMALL SOCKET 32MM NEU (Insert) ×3 IMPLANT
KIT BASIN OR (CUSTOM PROCEDURE TRAY) ×3 IMPLANT
KIT TURNOVER KIT A (KITS) IMPLANT
MANIFOLD NEPTUNE II (INSTRUMENTS) ×3 IMPLANT
NS IRRIG 1000ML POUR BTL (IV SOLUTION) ×3 IMPLANT
P2 COATDE GLNOID BSEPLT 6.5X30 (Shoulder) ×3 IMPLANT
PACK SHOULDER (CUSTOM PROCEDURE TRAY) ×3 IMPLANT
PAD COLD SHLDR WRAP-ON (PAD) ×3 IMPLANT
PENCIL SMOKE EVACUATOR (MISCELLANEOUS) IMPLANT
PROTECTOR NERVE ULNAR (MISCELLANEOUS) ×3 IMPLANT
SCREW BONE LOCKING RSP 5.0X14 (Screw) ×3 IMPLANT
SCREW BONE LOCKING RSP 5.0X30 (Screw) ×3 IMPLANT
SCREW BONE LOCKING RSP 5.0X34 (Screw) ×3 IMPLANT
SCREW BONE RSP LOCK 5X14 (Screw) ×1 IMPLANT
SCREW BONE RSP LOCK 5X18 (Screw) ×1 IMPLANT
SCREW BONE RSP LOCK 5X30 (Screw) ×1 IMPLANT
SCREW BONE RSP LOCK 5X34 (Screw) ×1 IMPLANT
SCREW BONE RSP LOCKING 18MM LG (Screw) ×3 IMPLANT
SCREW RETAIN W/HEAD 4MM OFFSET (Shoulder) ×3 IMPLANT
SET HNDPC FAN SPRY TIP SCT (DISPOSABLE) ×1 IMPLANT
SLING ARM IMMOBILIZER LRG (SOFTGOODS) ×3 IMPLANT
SPONGE LAP 18X18 RF (DISPOSABLE) ×3 IMPLANT
STEM REV PRIMARY 8X108 (Shoulder) ×3 IMPLANT
STRIP CLOSURE SKIN 1/2X4 (GAUZE/BANDAGES/DRESSINGS) ×4 IMPLANT
SUCTION FRAZIER HANDLE 12FR (TUBING) ×2
SUCTION TUBE FRAZIER 12FR DISP (TUBING) ×1 IMPLANT
SUT ETHIBOND 2 V 37 (SUTURE) IMPLANT
SUT MNCRL AB 4-0 PS2 18 (SUTURE) ×3 IMPLANT
SUT VIC AB 0 CT1 36 (SUTURE) ×3 IMPLANT
SUT VIC AB 2-0 CT1 27 (SUTURE) ×2
SUT VIC AB 2-0 CT1 TAPERPNT 27 (SUTURE) ×1 IMPLANT
SWAB COLLECTION DEVICE MRSA (MISCELLANEOUS) IMPLANT
SWAB CULTURE ESWAB REG 1ML (MISCELLANEOUS) IMPLANT
TOWEL OR 17X26 10 PK STRL BLUE (TOWEL DISPOSABLE) ×3 IMPLANT
TOWEL OR NON WOVEN STRL DISP B (DISPOSABLE) ×3 IMPLANT
WATER STERILE IRR 1000ML POUR (IV SOLUTION) ×3 IMPLANT

## 2019-10-18 NOTE — Op Note (Addendum)
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 10/18/2019  Preoperative diagnosis: Painful left shoulder hemiarthroplasty, history of infection  Postoperative diagnosis: Same  Procedure(s) and Anesthesia Type:    LEFT TOTAL SHOULDER REVISION FROM HEMIARTHROPATHY TO REVERSE TOTAL SHOULDER ARTHROPLASTY - Choice   Indications:  54 y.o. 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  Ultimately she has cleared the infection by all measurable markers and is not happy with her level of pain or function in the joint.  Indicated for revision to reverse total shoulder arthroplasty to try and improve function and decrease pain.  She understood risk benefits alternatives of the procedure including but not limited to risk of recurrent infection, fracture dislocation etc.  She wanted to proceed.     Surgeon: [name]   Assistants: Jeanmarie Hubert PA-C [facility] was present and scrubbed throughout the procedure and was essential in positioning, retraction, exposure, and closure)  Anesthesia: General endotracheal anesthesia with preoperative interscalene block given by the attending anesthesiologist    Procedure Detail  [place]   Estimated Blood Loss:  200 mL         Drains: none  Blood Given: none          Specimens: none        Complications:  * No complications entered in OR log *         Disposition: PACU - hemodynamically stable.         Condition: stable      OPERATIVE FINDINGS:  The cement spacer was removed without difficulty.  There was no visible sign of infection in the joint.  Bone quality was still good.  A DJO Altivate pressfit reverse total shoulder arthroplasty was placed with a  size 8 stem,  a 32-4 glenosphere, and a standard poly insert. The base plate  fixation was excellent.  PROCEDURE: The patient was identified in the preoperative holding area  where I personally marked the operative site after verifying site, side,  and procedure with the patient. An interscalene block given by  the attending anesthesiologist in the holding area and the patient was taken back to the operating room where all extremities were  carefully padded in position after general anesthesia was induced. She  was placed in a beach-chair position and the operative upper extremity was  prepped and draped in a standard sterile fashion. An approximately 10-  cm incision was made from the tip of the coracoid process to the center  point of the humerus at the level of the axilla. Dissection was carried  down through subcutaneous tissues where there was extensive scar in the deltopectoral interval.  Care was taken to dissect down through the scar to find the interval carefully and bring the deltoid laterally and the pectoralis major medially.  The conjoined tendon was identified and a retractor was placed beneath the conjoined tendon.  The axillary nerve was palpated and protected. The  joint was then gently externally rotated while the capsule was released  from the humeral neck around to just beyond the 6 o'clock position. At  this point, the joint was dislocated and the humeral head was presented  into the wound.  The cement implant was easily removed.  Cultures were taken from the capsule and from the bony canal after removal of the  implant.   The glenoid was exposed with the arm in an  abducted extended position. The anterior and posterior labrum were  completely excised and the capsule was released circumferentially to  allow for exposure of the glenoid for preparation. The 2.5 mm drill was  placed using the guide in 5-10 inferior angulation and the tap was then advanced in the same hole. Small and large  reamers were then used. The tap was then removed and the Metaglene was then screwed in with excellent purchase.  The peripheral guide was then used to drilled measured and filled peripheral locking screws. The size 32-4 glenosphere was then impacted on the [facility] taper and the central screw was placed.   The humerus was then again exposed and the diaphyseal reamers were used followed by the metaphyseal reamers. The final broach was left in place in the proximal trial was placed. The joint was reduced and with this implant it was felt that soft tissue tensioning was appropriate with excellent stability and excellent range of motion. Therefore, final humeral stem was placed press-fit.  And then the trial polyethylene inserts were tested again and the above implant was felt to be the most appropriate for final insertion. The joint was reduced taken through full range of motion and felt to be stable. Soft tissue tension was appropriate.  The joint was then copiously irrigated with pulse  lavage and the wound was then closed. The subscapularis was not repaired.  Skin was closed with 2-0 Vicryl in a deep dermal layer and 4-0  Monocryl for skin closure. Steri-Strips were applied. Sterile  dressings were then applied as well as a sling. The patient was allowed  to awaken from general anesthesia, transferred to stretcher, and taken  to recovery room in stable condition.   POSTOPERATIVE PLAN: The patient will be observed in the recovery room.  If her pain is well controlled, she is physiologically stable and there is no signs of oozing on the dressing we will plan on discharging her home with family today.  If there is any concern we can keep her overnight for observation.

## 2019-10-18 NOTE — Anesthesia Procedure Notes (Addendum)
Procedure Name: MAC Date/Time: 10/18/2019 6:54 AM Performed by: Cynda Familia, CRNA Pre-anesthesia Checklist: Patient identified, Emergency Drugs available, Suction available, Patient being monitored and Timeout performed Patient Re-evaluated:Patient Re-evaluated prior to induction Oxygen Delivery Method: Nasal cannula Placement Confirmation: positive ETCO2 and breath sounds checked- equal and bilateral Dental Injury: Teeth and Oropharynx as per pre-operative assessment

## 2019-10-18 NOTE — Transfer of Care (Signed)
Immediate Anesthesia Transfer of Care Note  Patient: Alicia Lynch  Procedure(s) Performed: TOTAL SHOULDER REVISION FROM HEMIARTHROPATHY TO REVERSE TOTAL SHOULDER ARTHROPLASTY (Left Shoulder)  Patient Location: PACU  Anesthesia Type:General  Level of Consciousness: awake and alert   Airway & Oxygen Therapy: Patient Spontanous Breathing and Patient connected to face mask oxygen  Post-op Assessment: Report given to RN and Post -op Vital signs reviewed and stable  Post vital signs: Reviewed and stable  Last Vitals:  Vitals Value Taken Time  BP    Temp    Pulse    Resp    SpO2      Last Pain:  Vitals:   10/18/19 0544  TempSrc:   PainSc: 4       Patients Stated Pain Goal: 4 ([id number])  Complications: No apparent anesthesia complications

## 2019-10-18 NOTE — Anesthesia Procedure Notes (Signed)
Procedure Name: Intubation Date/Time: 10/18/2019 7:38 AM Performed by: Cynda Familia, CRNA Pre-anesthesia Checklist: Patient identified, Emergency Drugs available, Suction available and Patient being monitored Patient Re-evaluated:Patient Re-evaluated prior to induction Oxygen Delivery Method: Circle System Utilized Preoxygenation: Pre-oxygenation with 100% oxygen Induction Type: IV induction Ventilation: Mask ventilation without difficulty Laryngoscope Size: Miller and 1 Grade View: Grade I Tube type: Oral Number of attempts: 1 Airway Equipment and Method: Stylet Placement Confirmation: ETT inserted through vocal cords under direct vision,  positive ETCO2 and breath sounds checked- equal and bilateral Secured at: 21 cm Tube secured with: Tape Dental Injury: Teeth and Oropharynx as per pre-operative assessment  Comments: Smooth IV induction H[place]er MDA-- intubation AM CRNA atraumatic-- teeth and mouth as preop-- no teeth-- bilat BS [place]

## 2019-10-18 NOTE — Anesthesia Procedure Notes (Signed)
Date/Time: 10/18/2019 8:58 AM Performed by: Cynda Familia, CRNA Oxygen Delivery Method: Simple face mask Placement Confirmation: positive ETCO2 and breath sounds checked- equal and bilateral Dental Injury: Teeth and Oropharynx as per pre-operative assessment

## 2019-10-18 NOTE — Anesthesia Procedure Notes (Signed)
Anesthesia Regional Block: Interscalene brachial plexus block   Pre-Anesthetic Checklist: ,, timeout performed, Correct Patient, Correct Site, Correct Laterality, Correct Procedure, Correct Position, site marked, Risks and benefits discussed,  Surgical consent,  Pre-op evaluation,  At surgeon's request and post-op pain management  Laterality: Upper and Left  Prep: Maximum Sterile Barrier Precautions used, chloraprep       Needles:  Injection technique: Single-shot  Needle Type: Echogenic Needle     Needle Length: 5cm  Needle Gauge: 21     Additional Needles:   Procedures:,,,, ultrasound used (permanent image in chart),,,,  Narrative:  Start time: 10/18/2019 6:55 AM End time: 10/18/2019 7:05 AM Injection made incrementally with aspirations every 5 mL.  Performed by: Personally  Anesthesiologist: Barnet Glasgow, MD  Additional Notes: Block assessed prior to procedure. Patient tolerated procedure well.
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
## 2019-10-18 NOTE — Anesthesia Postprocedure Evaluation (Signed)
Anesthesia Post Note  Patient: Alicia Lynch  Procedure(s) Performed: TOTAL SHOULDER REVISION FROM HEMIARTHROPATHY TO REVERSE TOTAL SHOULDER ARTHROPLASTY (Left Shoulder)     Patient location during evaluation: PACU Anesthesia Type: General Level of consciousness: awake and alert Pain management: pain level controlled Vital Signs Assessment: post-procedure vital signs reviewed and stable Respiratory status: spontaneous breathing, nonlabored ventilation, respiratory function stable and patient connected to nasal cannula oxygen Cardiovascular status: blood pressure returned to baseline and stable Postop Assessment: no apparent nausea or vomiting Anesthetic complications: no    Last Vitals:  Vitals:   10/18/19 1000 10/18/19 1015  BP: 124/73 118/86  Pulse: 82 88  Resp: 18 18  Temp:  36.5 C  SpO2: 96% 100%    Last Pain:  Vitals:   10/18/19 1015  TempSrc:   PainSc: 2                  Barnet Glasgow

## 2019-10-18 NOTE — Discharge Instructions (Signed)

## 2019-10-19 ENCOUNTER — Encounter: Payer: Self-pay | Admitting: *Deleted

## 2019-10-23 LAB — AEROBIC/ANAEROBIC CULTURE W GRAM STAIN (SURGICAL/DEEP WOUND)
Culture: NO GROWTH
Culture: NO GROWTH

## 2019-11-06 ENCOUNTER — Ambulatory Visit: Payer: Medicaid Other | Admitting: Infectious Diseases

## 2019-11-06 ENCOUNTER — Ambulatory Visit: Payer: Medicaid Other | Attending: Infectious Diseases | Admitting: Infectious Diseases

## 2019-11-06 DIAGNOSIS — F329 Major depressive disorder, single episode, unspecified: Secondary | ICD-10-CM

## 2019-11-06 DIAGNOSIS — B20 Human immunodeficiency virus [HIV] disease: Secondary | ICD-10-CM | POA: Diagnosis not present

## 2019-11-06 DIAGNOSIS — Z79899 Other long term (current) drug therapy: Secondary | ICD-10-CM

## 2019-11-06 DIAGNOSIS — G8929 Other chronic pain: Secondary | ICD-10-CM | POA: Diagnosis not present

## 2019-11-06 DIAGNOSIS — M545 Low back pain: Secondary | ICD-10-CM | POA: Diagnosis not present

## 2019-11-06 DIAGNOSIS — F419 Anxiety disorder, unspecified: Secondary | ICD-10-CM

## 2019-11-06 DIAGNOSIS — Z886 Allergy status to analgesic agent status: Secondary | ICD-10-CM

## 2019-11-06 DIAGNOSIS — J45909 Unspecified asthma, uncomplicated: Secondary | ICD-10-CM

## 2019-11-06 DIAGNOSIS — I10 Essential (primary) hypertension: Secondary | ICD-10-CM | POA: Diagnosis not present

## 2019-11-06 DIAGNOSIS — D638 Anemia in other chronic diseases classified elsewhere: Secondary | ICD-10-CM

## 2019-11-06 DIAGNOSIS — Z885 Allergy status to narcotic agent status: Secondary | ICD-10-CM

## 2019-11-06 DIAGNOSIS — Z96611 Presence of right artificial shoulder joint: Secondary | ICD-10-CM

## 2019-11-06 DIAGNOSIS — Z888 Allergy status to other drugs, medicaments and biological substances status: Secondary | ICD-10-CM
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
## 2019-11-07 ENCOUNTER — Telehealth: Payer: Self-pay | Admitting: Oncology

## 2019-11-07 NOTE — Telephone Encounter (Signed)
Dr. Janese Banks will not be in the office on 12-13-19. Appts were changed to 12-10-19 for lab and 12-11-19 for MD virtual visit. Patient is aware.

## 2019-11-15 ENCOUNTER — Other Ambulatory Visit: Payer: Medicaid Other

## 2019-11-15 ENCOUNTER — Ambulatory Visit: Payer: Medicaid Other | Admitting: Oncology

## 2019-12-10 ENCOUNTER — Other Ambulatory Visit: Payer: Medicaid Other

## 2019-12-10 ENCOUNTER — Inpatient Hospital Stay: Payer: Medicaid Other

## 2019-12-11 ENCOUNTER — Other Ambulatory Visit: Payer: Self-pay

## 2019-12-11 ENCOUNTER — Inpatient Hospital Stay: Payer: Medicaid Other

## 2019-12-11 ENCOUNTER — Inpatient Hospital Stay: Payer: Medicaid Other | Attending: Oncology | Admitting: Oncology

## 2019-12-11 DIAGNOSIS — D709 Neutropenia, unspecified: Secondary | ICD-10-CM | POA: Diagnosis present

## 2019-12-11 LAB — CBC WITH DIFFERENTIAL/PLATELET
Abs Immature Granulocytes: 0.01 10*3/uL (ref 0.00–0.07)
Basophils Absolute: 0 10*3/uL (ref 0.0–0.1)
Basophils Relative: 1 %
Eosinophils Absolute: 0.1 10*3/uL (ref 0.0–0.5)
Eosinophils Relative: 2 %
HCT: 39.1 % (ref 36.0–46.0)
Hemoglobin: 12.2 g/dL (ref 12.0–15.0)
Immature Granulocytes: 0 %
Lymphocytes Relative: 60 %
Lymphs Abs: 1.8 10*3/uL (ref 0.7–4.0)
MCH: 27.9 pg (ref 26.0–34.0)
MCHC: 31.2 g/dL (ref 30.0–36.0)
MCV: 89.5 fL (ref 80.0–100.0)
Monocytes Absolute: 0.4 10*3/uL (ref 0.1–1.0)
Monocytes Relative: 12 %
Neutro Abs: 0.8 10*3/uL — ABNORMAL LOW (ref 1.7–7.7)
Neutrophils Relative %: 25 %
Platelets: 261 10*3/uL (ref 150–400)
RBC: 4.37 MIL/uL (ref 3.87–5.11)
RDW: 12.8 % (ref 11.5–15.5)
WBC: 3 10*3/uL — ABNORMAL LOW (ref 4.0–10.5)
nRBC: 0 % (ref 0.0–0.2)

## 2019-12-13 ENCOUNTER — Other Ambulatory Visit: Payer: Medicaid Other

## 2019-12-13 ENCOUNTER — Ambulatory Visit: Payer: Medicaid Other | Admitting: Oncology

## 2019-12-17 ENCOUNTER — Encounter: Payer: Self-pay | Admitting: Oncology
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
## 2020-02-08 ENCOUNTER — Telehealth: Payer: Self-pay | Admitting: *Deleted

## 2020-02-08 NOTE — Telephone Encounter (Signed)
Pt called to say she can't make appt for labs on 5/11 at 11:45.  icalled her back and the appt was for 5/10 but she still needs it earlier- changed appt to 8:30 on 5/10. Pt agreeable to new time.

## 2020-02-11 ENCOUNTER — Inpatient Hospital Stay: Payer: Medicaid Other

## 2020-02-11 ENCOUNTER — Inpatient Hospital Stay: Payer: Medicaid Other | Attending: Oncology

## 2020-02-15 ENCOUNTER — Telehealth: Payer: Self-pay | Admitting: *Deleted

## 2020-02-15 NOTE — Telephone Encounter (Signed)
Pt called to try to r/s appt and could not have a 8:30 appt due to cancer ctr pt. That have chemo takes the spots. She wanted to know why she had to come there any way she does not have cancer. I told her she is correct about does not have cancer but she has neutropenia which is her wbc count is low. The wbc fight off infections for your body. The neutrophils is a type of blood cell that is the main fighter cell for infection. So she needs to be monitored to make sure it does not drop too low. Sometimes if it goes too low she may need injections to keep it up-that is why she will be monitored. I asked her if she would like to reschedule and she said yes but her husband and her share the car and he goes to work around 1 pm and he tries to sleep when he gets home. I asked about 11:30 appt and that way he can sleep and then bring her to appt and then have time to take her home and go to work. She will ask him and call me back and I gave her my direct number. She sais she will call me monday

## 2020-02-25 ENCOUNTER — Other Ambulatory Visit: Payer: Self-pay | Admitting: Infectious Diseases

## 2020-02-25 ENCOUNTER — Other Ambulatory Visit: Payer: Self-pay

## 2020-02-25 DIAGNOSIS — B20 Human immunodeficiency virus [HIV] disease: Secondary | ICD-10-CM

## 2020-02-25 MED ORDER — BIKTARVY 50-200-25 MG PO TABS: 1.0000 | ORAL_TABLET | Freq: Every day | ORAL | 3 refills | Status: DC

## 2020-03-21 ENCOUNTER — Other Ambulatory Visit: Payer: Self-pay | Admitting: Infectious Diseases

## 2020-03-21 DIAGNOSIS — B20 Human immunodeficiency virus [HIV] disease: Secondary | ICD-10-CM

## 2020-03-25 ENCOUNTER — Ambulatory Visit: Payer: Medicaid Other | Admitting: Infectious Diseases

## 2020-04-10 ENCOUNTER — Ambulatory Visit: Payer: Medicaid Other | Admitting: Infectious Diseases

## 2020-04-11 ENCOUNTER — Other Ambulatory Visit: Payer: Self-pay

## 2020-04-11 ENCOUNTER — Encounter: Payer: Self-pay | Admitting: Oncology

## 2020-04-11 ENCOUNTER — Inpatient Hospital Stay: Payer: Medicaid Other | Attending: Oncology

## 2020-04-11 ENCOUNTER — Inpatient Hospital Stay (HOSPITAL_BASED_OUTPATIENT_CLINIC_OR_DEPARTMENT_OTHER): Payer: Medicaid Other | Admitting: Oncology

## 2020-04-11 VITALS — BP 134/90 | HR 87 | Temp 96.7°F | Resp 18 | Ht 64.0 in | Wt 237.0 lb

## 2020-04-11 DIAGNOSIS — B20 Human immunodeficiency virus [HIV] disease: Secondary | ICD-10-CM | POA: Diagnosis not present

## 2020-04-11 DIAGNOSIS — M545 Low back pain: Secondary | ICD-10-CM | POA: Diagnosis not present

## 2020-04-11 DIAGNOSIS — I10 Essential (primary) hypertension: Secondary | ICD-10-CM | POA: Insufficient documentation

## 2020-04-11 DIAGNOSIS — D709 Neutropenia, unspecified: Secondary | ICD-10-CM | POA: Insufficient documentation

## 2020-04-11 DIAGNOSIS — G8929 Other chronic pain: Secondary | ICD-10-CM | POA: Insufficient documentation

## 2020-04-11 DIAGNOSIS — Z79899 Other long term (current) drug therapy: Secondary | ICD-10-CM | POA: Diagnosis not present

## 2020-04-11 LAB — CBC WITH DIFFERENTIAL/PLATELET
Abs Immature Granulocytes: 0.01 10*3/uL (ref 0.00–0.07)
Basophils Absolute: 0 10*3/uL (ref 0.0–0.1)
Basophils Relative: 1 %
Eosinophils Absolute: 0.1 10*3/uL (ref 0.0–0.5)
Eosinophils Relative: 2 %
HCT: 37.1 % (ref 36.0–46.0)
Hemoglobin: 12.7 g/dL (ref 12.0–15.0)
Immature Granulocytes: 0 %
Lymphocytes Relative: 55 %
Lymphs Abs: 1.9 10*3/uL (ref 0.7–4.0)
MCH: 29.1 pg (ref 26.0–34.0)
MCHC: 34.2 g/dL (ref 30.0–36.0)
MCV: 85.1 fL (ref 80.0–100.0)
Monocytes Absolute: 0.4 10*3/uL (ref 0.1–1.0)
Monocytes Relative: 10 %
Neutro Abs: 1.2 10*3/uL — ABNORMAL LOW (ref 1.7–7.7)
Neutrophils Relative %: 32 %
Platelets: 239 10*3/uL (ref 150–400)
RBC: 4.36 MIL/uL (ref 3.87–5.11)
RDW: 12.9 % (ref 11.5–15.5)
WBC: 3.6 10*3/uL — ABNORMAL LOW (ref 4.0–10.5)
nRBC: 0 % (ref 0.0–0.2)
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
## 2020-04-24 ENCOUNTER — Ambulatory Visit: Payer: Medicaid Other | Admitting: Infectious Diseases

## 2020-05-22 ENCOUNTER — Other Ambulatory Visit
Admission: RE | Admit: 2020-05-22 | Discharge: 2020-05-22 | Disposition: A | Discharge status: Home / Self Care | Payer: Medicaid Other | Attending: Infectious Diseases | Admitting: Infectious Diseases

## 2020-05-22 ENCOUNTER — Other Ambulatory Visit: Payer: Self-pay

## 2020-05-22 ENCOUNTER — Ambulatory Visit: Payer: Medicaid Other | Attending: Infectious Diseases | Admitting: Infectious Diseases

## 2020-05-22 ENCOUNTER — Encounter: Payer: Self-pay | Admitting: Infectious Diseases

## 2020-05-22 VITALS — BP 127/87 | HR 88 | Resp 16 | Ht 64.0 in | Wt 245.0 lb

## 2020-05-22 DIAGNOSIS — F419 Anxiety disorder, unspecified: Secondary | ICD-10-CM | POA: Insufficient documentation

## 2020-05-22 DIAGNOSIS — D638 Anemia in other chronic diseases classified elsewhere: Secondary | ICD-10-CM | POA: Insufficient documentation

## 2020-05-22 DIAGNOSIS — I10 Essential (primary) hypertension: Secondary | ICD-10-CM | POA: Insufficient documentation

## 2020-05-22 DIAGNOSIS — Z7901 Long term (current) use of anticoagulants: Secondary | ICD-10-CM | POA: Insufficient documentation

## 2020-05-22 DIAGNOSIS — Z23 Encounter for immunization: Secondary | ICD-10-CM | POA: Insufficient documentation

## 2020-05-22 DIAGNOSIS — B20 Human immunodeficiency virus [HIV] disease: Secondary | ICD-10-CM | POA: Insufficient documentation

## 2020-05-22 DIAGNOSIS — Z7982 Long term (current) use of aspirin: Secondary | ICD-10-CM | POA: Diagnosis not present

## 2020-05-22 DIAGNOSIS — J449 Chronic obstructive pulmonary disease, unspecified: Secondary | ICD-10-CM | POA: Insufficient documentation

## 2020-05-22 DIAGNOSIS — M48061 Spinal stenosis, lumbar region without neurogenic claudication: Secondary | ICD-10-CM | POA: Insufficient documentation

## 2020-05-22 DIAGNOSIS — Z79899 Other long term (current) drug therapy: Secondary | ICD-10-CM | POA: Insufficient documentation

## 2020-05-22 DIAGNOSIS — G8929 Other chronic pain: Secondary | ICD-10-CM | POA: Diagnosis not present

## 2020-05-22 DIAGNOSIS — F329 Major depressive disorder, single episode, unspecified: Secondary | ICD-10-CM | POA: Diagnosis not present

## 2020-05-22 LAB — LIPID PANEL
Cholesterol: 219 mg/dL — ABNORMAL HIGH (ref 0–200)
HDL: 114 mg/dL (ref >40)
LDL Cholesterol: 93 mg/dL (ref 0–99)
Total CHOL/HDL Ratio: 1.9 RATIO
Triglycerides: 61 mg/dL (ref <150)
VLDL: 12 mg/dL (ref 0–40)

## 2020-05-22 LAB — HEMOGLOBIN A1C
Hgb A1c MFr Bld: 5.7 % — ABNORMAL HIGH (ref 4.8–5.6)
Mean Plasma Glucose: 116.89 mg/dL

## 2020-05-22 LAB — COMPREHENSIVE METABOLIC PANEL
ALT: 12 U/L (ref 0–44)
AST: 14 U/L — ABNORMAL LOW (ref 15–41)
Albumin: 4 g/dL (ref 3.5–5.0)
Alkaline Phosphatase: 71 U/L (ref 38–126)
Anion gap: 10 (ref 5–15)
BUN: 12 mg/dL (ref 6–20)
CO2: 26 mmol/L (ref 22–32)
Calcium: 9.7 mg/dL (ref 8.9–10.3)
Chloride: 106 mmol/L (ref 98–111)
Creatinine, Ser: 1.05 mg/dL — ABNORMAL HIGH (ref 0.44–1.00)
GFR calc Af Amer: >60 mL/min (ref >60)
GFR calc non Af Amer: >60 mL/min (ref >60)
Glucose, Bld: 92 mg/dL (ref 70–99)
Potassium: 4.2 mmol/L (ref 3.5–5.1)
Sodium: 142 mmol/L (ref 135–145)
Total Bilirubin: 1.1 mg/dL (ref 0.3–1.2)
Total Protein: 8 g/dL (ref 6.5–8.1)

## 2020-05-22 NOTE — Patient Instructions (Addendum)
You are here for follow up Your virus is under good control- continue Biktarvy You have gained 80 pounds in 1 year-  I have given information on weight loss- calorie count and exercise Today will do labs You also will get Pneumovac 23 vaccine You need to get your corona virus vaccine Will follow up in 6 months  Calorie Counting for Weight Loss Calories are units of energy. Your body needs a certain amount of calories from food to keep you going throughout the day. When you eat more calories than your body needs, your body stores the extra calories as fat. When you eat fewer calories than your body needs, your body burns fat to get the energy it needs. Calorie counting means keeping track of how many calories you eat and drink each day. Calorie counting can be helpful if you need to lose weight. If you make sure to eat fewer calories than your body needs, you should lose weight. Ask your health care provider what a healthy weight is for you. For calorie counting to work, you will need to eat the right number of calories in a day in order to lose a healthy amount of weight per week. A dietitian can help you determine how many calories you need in a day and will give you suggestions on how to reach your calorie goal.  A healthy amount of weight to lose per week is usually 1-2 lb (0.5-0.9 kg). This usually means that your daily calorie intake should be reduced by 500-750 calories.  Eating 1,200 - 1,500 calories per day can help most women lose weight.  Eating 1,500 - 1,800 calories per day can help most men lose weight. What is my plan? My goal is to have __________ calories per day. If I have this many calories per day, I should lose around __________ pounds per week. What do I need to know about calorie counting? In order to meet your daily calorie goal, you will need to:  Find out how many calories are in each food you would like to eat. Try to do this before you eat.  Decide how much of  the food you plan to eat.  Write down what you ate and how many calories it had. Doing this is called keeping a food log. To successfully lose weight, it is important to balance calorie counting with a healthy lifestyle that includes regular activity. Aim for 150 minutes of moderate exercise (such as walking) or 75 minutes of vigorous exercise (such as running) each week. Where do I find calorie information?  The number of calories in a food can be found on a Nutrition Facts label. If a food does not have a Nutrition Facts label, try to look up the calories online or ask your dietitian for help. Remember that calories are listed per serving. If you choose to have more than one serving of a food, you will have to multiply the calories per serving by the amount of servings you plan to eat. For example, the label on a package of bread might say that a serving size is 1 slice and that there are 90 calories in a serving. If you eat 1 slice, you will have eaten 90 calories. If you eat 2 slices, you will have eaten 180 calories. How do I keep a food log? Immediately after each meal, record the following information in your food log:  What you ate. Don't forget to include toppings, sauces, and other extras on the food.  How much you ate. This can be measured in cups, ounces, or number of items.  How many calories each food and drink had.  The total number of calories in the meal. Keep your food log near you, such as in a small notebook in your pocket, or use a mobile app or website. Some programs will calculate calories for you and show you how many calories you have left for the day to meet your goal. What are some calorie counting tips?   Use your calories on foods and drinks that will fill you up and not leave you hungry: ? Some examples of foods that fill you up are nuts and nut butters, vegetables, lean proteins, and high-fiber foods like whole grains. High-fiber foods are foods with more than 5  g fiber per serving. ? Drinks such as sodas, specialty coffee drinks, alcohol, and juices have a lot of calories, yet do not fill you up.  Eat nutritious foods and avoid empty calories. Empty calories are calories you get from foods or beverages that do not have many vitamins or protein, such as candy, sweets, and soda. It is better to have a nutritious high-calorie food (such as an avocado) than a food with few nutrients (such as a bag of chips).  Know how many calories are in the foods you eat most often. This will help you calculate calorie counts faster.  Pay attention to calories in drinks. Low-calorie drinks include water and unsweetened drinks.  Pay attention to nutrition labels for "low fat" or "fat free" foods. These foods sometimes have the same amount of calories or more calories than the full fat versions. They also often have added sugar, starch, or salt, to make up for flavor that was removed with the fat.  Find a way of tracking calories that works for you. Get creative. Try different apps or programs if writing down calories does not work for you. What are some portion control tips?  Know how many calories are in a serving. This will help you know how many servings of a certain food you can have.  Use a measuring cup to measure serving sizes. You could also try weighing out portions on a kitchen scale. With time, you will be able to estimate serving sizes for some foods.  Take some time to put servings of different foods on your favorite plates, bowls, and cups so you know what a serving looks like.  Try not to eat straight from a bag or box. Doing this can lead to overeating. Put the amount you would like to eat in a cup or on a plate to make sure you are eating the right portion.  Use smaller plates, glasses, and bowls to prevent overeating.  Try not to multitask (for example, watch TV or use your computer) while eating. If it is time to eat, sit down at a table and enjoy  your food. This will help you to know when you are full. It will also help you to be aware of what you are eating and how much you are eating. What are tips for following this plan? Reading food labels  Check the calorie count compared to the serving size. The serving size may be smaller than what you are used to eating.  Check the source of the calories. Make sure the food you are eating is high in vitamins and protein and low in saturated and trans fats. Shopping  Read nutrition labels while you shop. This will help  you make healthy decisions before you decide to purchase your food.  Make a grocery list and stick to it. Cooking  Try to cook your favorite foods in a healthier way. For example, try baking instead of frying.  Use low-fat dairy products. Meal planning  Use more fruits and vegetables. Half of your plate should be fruits and vegetables.  Include lean proteins like poultry and fish. How do I count calories when eating out?  Ask for smaller portion sizes.  Consider sharing an entree and sides instead of getting your own entree.  If you get your own entree, eat only half. Ask for a box at the beginning of your meal and put the rest of your entree in it so you are not tempted to eat it.  If calories are listed on the menu, choose the lower calorie options.  Choose dishes that include vegetables, fruits, whole grains, low-fat dairy products, and lean protein.  Choose items that are boiled, broiled, grilled, or steamed. Stay away from items that are buttered, battered, fried, or served with cream sauce. Items labeled "crispy" are usually fried, unless stated otherwise.  Choose water, low-fat milk, unsweetened iced tea, or other drinks without added sugar. If you want an alcoholic beverage, choose a lower calorie option such as a glass of wine or light beer.  Ask for dressings, sauces, and syrups on the side. These are usually high in calories, so you should limit the  amount you eat.  If you want a salad, choose a garden salad and ask for grilled meats. Avoid extra toppings like bacon, cheese, or fried items. Ask for the dressing on the side, or ask for olive oil and vinegar or lemon to use as dressing.  Estimate how many servings of a food you are given. For example, a serving of cooked rice is  cup or about the size of half a baseball. Knowing serving sizes will help you be aware of how much food you are eating at restaurants. The list below tells you how big or small some common portion sizes are based on everyday objects: ? 1 oz--4 stacked dice. ? 3 oz--1 deck of cards. ? 1 tsp--1 die. ? 1 Tbsp-- a ping-pong ball. ? 2 Tbsp--1 ping-pong ball. ?  cup-- baseball. ? 1 cup--1 baseball. Summary  Calorie counting means keeping track of how many calories you eat and drink each day. If you eat fewer calories than your body needs, you should lose weight.  A healthy amount of weight to lose per week is usually 1-2 lb (0.5-0.9 kg). This usually means reducing your daily calorie intake by 500-750 calories.  The number of calories in a food can be found on a Nutrition Facts label. If a food does not have a Nutrition Facts label, try to look up the calories online or ask your dietitian for help.  Use your calories on foods and drinks that will fill you up, and not on foods and drinks that will leave you hungry.  Use smaller plates, glasses, and bowls to prevent overeating. This information is not intended to replace advice given to you by your health care provider. Make sure you discuss any questions you have with your health care provider. Document Revised: 06/09/2018 Document Reviewed: 08/20/2016 Elsevier Patient Education  2020 [employer].  Exercising to Lose Weight Exercise is structured, repetitive physical activity to improve fitness and health. Getting regular exercise is important for everyone. It is especially important if you are overweight.  Being  overweight increases your risk of heart disease, stroke, diabetes, high blood pressure, and several types of cancer. Reducing your calorie intake and exercising can help you lose weight. Exercise is usually categorized as moderate or vigorous intensity. To lose weight, most people need to do a certain amount of moderate-intensity or vigorous-intensity exercise each week. Moderate-intensity exercise  Moderate-intensity exercise is any activity that gets you moving enough to burn at least three times more energy (calories) than if you were sitting. Examples of moderate exercise include:  Walking a mile in 15 minutes.  Doing light yard work.  Biking at an easy pace. Most people should get at least 150 minutes (2 hours and 30 minutes) a week of moderate-intensity exercise to maintain their body weight. Vigorous-intensity exercise Vigorous-intensity exercise is any activity that gets you moving enough to burn at least six times more calories than if you were sitting. When you exercise at this intensity, you should be working hard enough that you are not able to carry on a conversation. Examples of vigorous exercise include:  Running.  Playing a team sport, such as football, basketball, and soccer.  Jumping rope. Most people should get at least 75 minutes (1 hour and 15 minutes) a week of vigorous-intensity exercise to maintain their body weight. How can exercise affect me? When you exercise enough to burn more calories than you eat, you lose weight. Exercise also reduces body fat and builds muscle. The more muscle you have, the more calories you burn. Exercise also:  Improves mood.  Reduces stress and tension.  Improves your overall fitness, flexibility, and endurance.  Increases bone strength. The amount of exercise you need to lose weight depends on:  Your age.  The type of exercise.  Any health conditions you have.  Your overall physical ability. Talk to your health care  provider about how much exercise you need and what types of activities are safe for you. What actions can I take to lose weight? Nutrition   Make changes to your diet as told by your health care provider or diet and nutrition specialist (dietitian). This may include: ? Eating fewer calories. ? Eating more protein. ? Eating less unhealthy fats. ? Eating a diet that includes fresh fruits and vegetables, whole grains, low-fat dairy products, and lean protein. ? Avoiding foods with added fat, salt, and sugar.  Drink plenty of water while you exercise to prevent dehydration or heat stroke. Activity  Choose an activity that you enjoy and set realistic goals. Your health care provider can help you make an exercise plan that works for you.  Exercise at a moderate or vigorous intensity most days of the week. ? The intensity of exercise may vary from person to person. You can tell how intense a workout is for you by paying attention to your breathing and heartbeat. Most people will notice their breathing and heartbeat get faster with more intense exercise.  Do resistance training twice each week, such as: ? Push-ups. ? Sit-ups. ? Lifting weights. ? Using resistance bands.  Getting short amounts of exercise can be just as helpful as long structured periods of exercise. If you have trouble finding time to exercise, try to include exercise in your daily routine. ? Get up, stretch, and walk around every 30 minutes throughout the day. ? Go for a walk during your lunch break. ? Park your car farther away from your destination. ? If you take public transportation, get off one stop early and walk the rest of the  way. ? Make phone calls while standing up and walking around. ? Take the stairs instead of elevators or escalators.  Wear comfortable clothes and shoes with good support.  Do not exercise so much that you hurt yourself, feel dizzy, or get very short of breath. Where to find more  information  [place] Department of Health and Human Services: [employer]at  Centers for Disease Control and Prevention (CDC): http://www.wolf.info/ Contact a health care provider:  Before starting a new exercise program.  If you have questions or concerns about your weight.  If you have a medical problem that keeps you from exercising. Get help right away if you have any of the following while exercising:  Injury.  Dizziness.  Difficulty breathing or shortness of breath that does not go away when you stop exercising.  Chest pain.  Rapid heartbeat. Summary  Being overweight increases your risk of heart disease, stroke, diabetes, high blood pressure, and several types of cancer.  Losing weight happens when you burn more calories than you eat.  Reducing the amount of calories you eat in addition to getting regular moderate or vigorous exercise each week helps you lose weight. This information is not intended to replace advice given to you by your health care provider. Make sure you discuss any questions you have with your health care provider. Document Revised: 10/03/2017 Document Reviewed: 10/03/2017 Elsevier Patient Education  2020 [employer].
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
## 2020-05-23 LAB — T-HELPER CELLS CD4/CD8 %
% CD 4 Pos. Lymph.: 19.6 % — ABNORMAL LOW (ref 30.8–58.5)
Absolute CD 4 Helper: 294 /uL — ABNORMAL LOW (ref 359–1519)
Basophils Absolute: 0 10*3/uL (ref 0.0–0.2)
Basos: 1 %
CD3+CD4+ Cells/CD3+CD8+ Cells Bld: 0.34 — ABNORMAL LOW (ref 0.92–3.72)
CD3+CD8+ Cells # Bld: 867 /uL (ref 109–897)
CD3+CD8+ Cells NFr Bld: 57.8 % — ABNORMAL HIGH (ref 12.0–35.5)
EOS (ABSOLUTE): 0.1 10*3/uL (ref 0.0–0.4)
Eos: 2 %
Hematocrit: 37.1 % (ref 34.0–46.6)
Hemoglobin: 12.8 g/dL (ref 11.1–15.9)
Immature Grans (Abs): 0 10*3/uL (ref 0.0–0.1)
Immature Granulocytes: 1 %
Lymphocytes Absolute: 1.5 10*3/uL (ref 0.7–3.1)
Lymphs: 44 %
MCH: 29.7 pg (ref 26.6–33.0)
MCHC: 34.5 g/dL (ref 31.5–35.7)
MCV: 86 fL (ref 79–97)
Monocytes Absolute: 0.4 10*3/uL (ref 0.1–0.9)
Monocytes: 12 %
Neutrophils Absolute: 1.4 10*3/uL (ref 1.4–7.0)
Neutrophils: 40 %
Platelets: 241 10*3/uL (ref 150–450)
RBC: 4.31 x10E6/uL (ref 3.77–5.28)
RDW: 12.6 % (ref 11.7–15.4)
WBC: 3.4 10*3/uL (ref 3.4–10.8)

## 2020-05-23 LAB — HIV-1 RNA QUANT-NO REFLEX-BLD
HIV 1 RNA Quant: 30 copies/mL
LOG10 HIV-1 RNA: 1.477 log10copy/mL

## 2020-05-25 LAB — QUANTIFERON-TB GOLD PLUS (RQFGPL)
QuantiFERON Mitogen Value: >10 IU/mL
QuantiFERON Nil Value: 0.72 IU/mL
QuantiFERON TB1 Ag Value: 0.8 IU/mL
QuantiFERON TB2 Ag Value: 0.87 IU/mL

## 2020-05-25 LAB — QUANTIFERON-TB GOLD PLUS: QuantiFERON-TB Gold Plus: NEGATIVE

## 2020-05-26 LAB — RPR, QUANT+TP ABS (REFLEX)
Rapid Plasma Reagin, Quant: 1:1 {titer} — ABNORMAL HIGH
T Pallidum Abs: NONREACTIVE

## 2020-05-27 LAB — RPR: RPR Ser Ql: REACTIVE — AB

## 2020-07-14 ENCOUNTER — Other Ambulatory Visit: Payer: Self-pay

## 2020-07-14 ENCOUNTER — Inpatient Hospital Stay: Payer: Medicaid Other | Attending: Oncology

## 2020-07-14 DIAGNOSIS — D709 Neutropenia, unspecified: Secondary | ICD-10-CM | POA: Diagnosis not present

## 2020-07-14 DIAGNOSIS — B20 Human immunodeficiency virus [HIV] disease: Secondary | ICD-10-CM | POA: Diagnosis not present

## 2020-07-14 LAB — CBC WITH DIFFERENTIAL/PLATELET
Abs Immature Granulocytes: 0.03 10*3/uL (ref 0.00–0.07)
Basophils Absolute: 0 10*3/uL (ref 0.0–0.1)
Basophils Relative: 1 %
Eosinophils Absolute: 0.1 10*3/uL (ref 0.0–0.5)
Eosinophils Relative: 3 %
HCT: 38.3 % (ref 36.0–46.0)
Hemoglobin: 12.8 g/dL (ref 12.0–15.0)
Immature Granulocytes: 1 %
Lymphocytes Relative: 40 %
Lymphs Abs: 1.6 10*3/uL (ref 0.7–4.0)
MCH: 29 pg (ref 26.0–34.0)
MCHC: 33.4 g/dL (ref 30.0–36.0)
MCV: 86.8 fL (ref 80.0–100.0)
Monocytes Absolute: 0.4 10*3/uL (ref 0.1–1.0)
Monocytes Relative: 11 %
Neutro Abs: 1.8 10*3/uL (ref 1.7–7.7)
Neutrophils Relative %: 44 %
Platelets: 237 10*3/uL (ref 150–400)
RBC: 4.41 MIL/uL (ref 3.87–5.11)
RDW: 13.2 % (ref 11.5–15.5)
WBC: 4 10*3/uL (ref 4.0–10.5)
nRBC: 0 % (ref 0.0–0.2)

## 2020-07-29 ENCOUNTER — Other Ambulatory Visit: Payer: Self-pay

## 2020-07-29 DIAGNOSIS — B20 Human immunodeficiency virus [HIV] disease: Secondary | ICD-10-CM

## 2020-07-29 MED ORDER — BIKTARVY 50-200-25 MG PO TABS: 1.0000 | ORAL_TABLET | Freq: Every day | ORAL | 3 refills | Status: DC

## 2020-10-10 ENCOUNTER — Inpatient Hospital Stay: Payer: Medicaid Other | Attending: Oncology

## 2020-10-10 ENCOUNTER — Other Ambulatory Visit: Payer: Self-pay

## 2020-10-10 DIAGNOSIS — D709 Neutropenia, unspecified: Secondary | ICD-10-CM | POA: Insufficient documentation

## 2020-10-10 DIAGNOSIS — Z21 Asymptomatic human immunodeficiency virus [HIV] infection status: Secondary | ICD-10-CM | POA: Diagnosis not present

## 2020-10-10 DIAGNOSIS — Z79899 Other long term (current) drug therapy: Secondary | ICD-10-CM | POA: Insufficient documentation

## 2020-10-10 DIAGNOSIS — I10 Essential (primary) hypertension: Secondary | ICD-10-CM | POA: Insufficient documentation

## 2020-10-10 LAB — CBC WITH DIFFERENTIAL/PLATELET
Abs Immature Granulocytes: 0.02 10*3/uL (ref 0.00–0.07)
Basophils Absolute: 0 10*3/uL (ref 0.0–0.1)
Basophils Relative: 1 %
Eosinophils Absolute: 0.1 10*3/uL (ref 0.0–0.5)
Eosinophils Relative: 3 %
HCT: 37.7 % (ref 36.0–46.0)
Hemoglobin: 12.8 g/dL (ref 12.0–15.0)
Immature Granulocytes: 1 %
Lymphocytes Relative: 44 %
Lymphs Abs: 1.9 10*3/uL (ref 0.7–4.0)
MCH: 28.7 pg (ref 26.0–34.0)
MCHC: 34 g/dL (ref 30.0–36.0)
MCV: 84.5 fL (ref 80.0–100.0)
Monocytes Absolute: 0.4 10*3/uL (ref 0.1–1.0)
Monocytes Relative: 9 %
Neutro Abs: 1.8 10*3/uL (ref 1.7–7.7)
Neutrophils Relative %: 42 %
Platelets: 253 10*3/uL (ref 150–400)
RBC: 4.46 MIL/uL (ref 3.87–5.11)
RDW: 12.7 % (ref 11.5–15.5)
WBC: 4.3 10*3/uL (ref 4.0–10.5)
nRBC: 0 % (ref 0.0–0.2)

## 2020-10-13 ENCOUNTER — Inpatient Hospital Stay: Payer: Medicaid Other | Admitting: Oncology

## 2020-10-13 ENCOUNTER — Other Ambulatory Visit: Payer: Self-pay
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
## 2020-10-23 ENCOUNTER — Inpatient Hospital Stay (HOSPITAL_BASED_OUTPATIENT_CLINIC_OR_DEPARTMENT_OTHER): Payer: Medicaid Other | Admitting: Oncology

## 2020-10-23 DIAGNOSIS — D708 Other neutropenia: Secondary | ICD-10-CM | POA: Diagnosis not present

## 2020-10-28 ENCOUNTER — Telehealth: Payer: Medicaid Other | Admitting: Oncology

## 2020-10-29 ENCOUNTER — Other Ambulatory Visit: Payer: Self-pay

## 2020-10-29 DIAGNOSIS — B20 Human immunodeficiency virus [HIV] disease: Secondary | ICD-10-CM

## 2020-10-29 MED ORDER — BIKTARVY 50-200-25 MG PO TABS: 1.0000 | ORAL_TABLET | Freq: Every day | ORAL | 3 refills | Status: DC

## 2020-11-27 ENCOUNTER — Ambulatory Visit: Payer: Medicaid Other | Admitting: Infectious Diseases

## 2021-02-13 ENCOUNTER — Other Ambulatory Visit: Payer: Self-pay | Admitting: Physician Assistant

## 2021-04-13 ENCOUNTER — Other Ambulatory Visit: Payer: Self-pay

## 2021-04-13 DIAGNOSIS — B20 Human immunodeficiency virus [HIV] disease: Secondary | ICD-10-CM

## 2021-04-13 MED ORDER — BIKTARVY 50-200-25 MG PO TABS: 1.0000 | ORAL_TABLET | Freq: Every day | ORAL | 3 refills | Status: DC

## 2021-06-10 ENCOUNTER — Telehealth: Payer: Self-pay

## 2021-06-10 NOTE — Telephone Encounter (Signed)
Patient left voicemail stating her medication was not covered by Medicaid. RN called [facility] who states Biktarvy is ready to be picked up with a $4 copay. RN relayed this to patient.   Beryle Flock, RN

## 2021-08-17 ENCOUNTER — Other Ambulatory Visit: Payer: Self-pay

## 2021-08-17 DIAGNOSIS — B20 Human immunodeficiency virus [HIV] disease: Secondary | ICD-10-CM

## 2021-08-17 MED ORDER — BIKTARVY 50-200-25 MG PO TABS: 1.0000 | ORAL_TABLET | Freq: Every day | ORAL | 3 refills | Status: DC

## 2021-09-01 ENCOUNTER — Other Ambulatory Visit: Payer: Self-pay | Admitting: Orthopedic Surgery

## 2021-09-01 ENCOUNTER — Other Ambulatory Visit (HOSPITAL_COMMUNITY): Payer: Self-pay | Admitting: Orthopedic Surgery

## 2021-09-01 DIAGNOSIS — M25512 Pain in left shoulder: Secondary | ICD-10-CM

## 2021-09-09 ENCOUNTER — Other Ambulatory Visit: Payer: Self-pay

## 2021-09-09 ENCOUNTER — Ambulatory Visit (HOSPITAL_COMMUNITY)
Admission: RE | Admit: 2021-09-09 | Discharge: 2021-09-09 | Disposition: A | Discharge status: Home / Self Care | Payer: Medicaid Other | Source: Ambulatory Visit | Attending: Orthopedic Surgery | Admitting: Orthopedic Surgery

## 2021-09-09 DIAGNOSIS — M25512 Pain in left shoulder: Secondary | ICD-10-CM | POA: Insufficient documentation

## 2021-09-09 LAB — SYNOVIAL CELL COUNT + DIFF, W/ CRYSTALS
Crystals, Fluid: NONE SEEN
Eosinophils-Synovial: UNDETERMINED % (ref 0–1)
Lymphocytes-Synovial Fld: UNDETERMINED % (ref 0–20)
Monocyte-Macrophage-Synovial Fluid: UNDETERMINED % (ref 50–90)
Neutrophil, Synovial: UNDETERMINED % (ref 0–25)
WBC, Synovial: UNDETERMINED /mm3 (ref 0–200)

## 2021-09-09 MED ORDER — LIDOCAINE HCL (PF) 1 % IJ SOLN
5.0000 mL | Freq: Once | INTRAMUSCULAR | Status: AC
  Administered 2021-09-09: 5 mL via INTRADERMAL

## 2021-09-12 LAB — BODY FLUID CULTURE W GRAM STAIN: Culture: NO GROWTH

## 2021-11-26 ENCOUNTER — Other Ambulatory Visit: Payer: Self-pay | Admitting: Student

## 2021-11-26 ENCOUNTER — Other Ambulatory Visit (HOSPITAL_COMMUNITY): Payer: Self-pay | Admitting: Student

## 2021-11-26 DIAGNOSIS — R609 Edema, unspecified: Secondary | ICD-10-CM

## 2021-11-26 DIAGNOSIS — R079 Chest pain, unspecified: Secondary | ICD-10-CM

## 2021-12-02 ENCOUNTER — Other Ambulatory Visit: Payer: Self-pay

## 2021-12-02 ENCOUNTER — Telehealth: Payer: Self-pay

## 2021-12-02 DIAGNOSIS — B20 Human immunodeficiency virus [HIV] disease: Secondary | ICD-10-CM

## 2021-12-02 MED ORDER — BIKTARVY 50-200-25 MG PO TABS: 1.0000 | ORAL_TABLET | Freq: Every day | ORAL | 0 refills | Status: DC

## 2021-12-02 NOTE — Telephone Encounter (Signed)
Received call from Alicia Lynch requesting refills for Alicia Lynch. Patient has not been seen since August 2021. Is overdue for a follow up appointment. Will need to schedule.  ?Left voicemail with patient requesting call back.Marland Kitchen  ?Will send 30 day supply to pharmacy. ?Alicia Lynch, RMA ? ?

## 2021-12-04 ENCOUNTER — Encounter
Admission: RE | Admit: 2021-12-04 | Discharge: 2021-12-04 | Disposition: A | Discharge status: Home / Self Care | Payer: Medicare Other | Source: Ambulatory Visit | Attending: Student | Admitting: Student

## 2021-12-04 ENCOUNTER — Ambulatory Visit
Admission: RE | Admit: 2021-12-04 | Discharge: 2021-12-04 | Disposition: A | Discharge status: Home / Self Care | Payer: Medicare Other | Source: Ambulatory Visit | Attending: Student | Admitting: Student

## 2021-12-04 ENCOUNTER — Other Ambulatory Visit: Payer: Self-pay

## 2021-12-04 DIAGNOSIS — D649 Anemia, unspecified: Secondary | ICD-10-CM | POA: Insufficient documentation

## 2021-12-04 DIAGNOSIS — J449 Chronic obstructive pulmonary disease, unspecified: Secondary | ICD-10-CM | POA: Insufficient documentation

## 2021-12-04 DIAGNOSIS — R079 Chest pain, unspecified: Secondary | ICD-10-CM

## 2021-12-04 DIAGNOSIS — I340 Nonrheumatic mitral (valve) insufficiency: Secondary | ICD-10-CM | POA: Diagnosis not present

## 2021-12-04 DIAGNOSIS — R609 Edema, unspecified: Secondary | ICD-10-CM | POA: Diagnosis not present

## 2021-12-04 DIAGNOSIS — I10 Essential (primary) hypertension: Secondary | ICD-10-CM | POA: Insufficient documentation

## 2021-12-04 DIAGNOSIS — R600 Localized edema: Secondary | ICD-10-CM

## 2021-12-04 LAB — ECHOCARDIOGRAM COMPLETE
AR max vel: 3.19 cm2
AV Area VTI: 3.34 cm2
AV Area mean vel: 2.53 cm2
AV Mean grad: 2 mmHg
AV Peak grad: 3.7 mmHg
Ao pk vel: 0.96 m/s
Area-P 1/2: 7.02 cm2
S' Lateral: 3.3 cm

## 2021-12-04 MED ORDER — TECHNETIUM TC 99M TETROFOSMIN IV KIT
30.0000 | PACK | Freq: Once | INTRAVENOUS | Status: AC | PRN
  Administered 2021-12-04: 31.83 via INTRAVENOUS

## 2021-12-04 MED ORDER — REGADENOSON 0.4 MG/5ML IV SOLN
0.4000 mg | Freq: Once | INTRAVENOUS | Status: AC
  Administered 2021-12-04: 0.4 mg via INTRAVENOUS
  Filled 2021-12-04: qty 5

## 2021-12-04 MED ORDER — TECHNETIUM TC 99M TETROFOSMIN IV KIT
10.8100 | PACK | Freq: Once | INTRAVENOUS | Status: AC | PRN
  Administered 2021-12-04: 10.81 via INTRAVENOUS

## 2021-12-04 NOTE — Progress Notes (Signed)
*  PRELIMINARY RESULTS* ?Echocardiogram ?2D Echocardiogram has been performed. ? ?Alicia Lynch, Sonia Side ?12/04/2021, 10:47 AM ?

## 2021-12-09 LAB — NM MYOCAR MULTI W/SPECT W/WALL MOTION / EF
Base ST Depression (mm): 0 mm
Estimated workload: 1
Exercise duration (sec): 54 s
LV dias vol: 131 mL (ref 46–106)
LV sys vol: 68 mL
MPHR: 120 {beats}/min
Nuc Stress EF: 48 %
Peak HR: 120 {beats}/min
Percent HR: 71 %
Rest HR: 89 {beats}/min
Rest Nuclear Isotope Dose: 10.8 mCi
SDS: 5
SRS: 6
SSS: 9
ST Depression (mm): 0 mm
Stress Nuclear Isotope Dose: 31.8 mCi
TID: 0.86

## 2021-12-29 ENCOUNTER — Other Ambulatory Visit
Admission: RE | Admit: 2021-12-29 | Discharge: 2021-12-29 | Disposition: A | Discharge status: Home / Self Care | Payer: Medicare Other | Attending: Infectious Diseases | Admitting: Infectious Diseases

## 2021-12-29 ENCOUNTER — Other Ambulatory Visit: Payer: Self-pay

## 2021-12-29 ENCOUNTER — Ambulatory Visit: Payer: Medicare Other | Attending: Infectious Diseases | Admitting: Infectious Diseases

## 2021-12-29 ENCOUNTER — Encounter: Payer: Self-pay | Admitting: Infectious Diseases

## 2021-12-29 VITALS — BP 122/86 | HR 87 | Temp 96.6°F | Wt 253.0 lb

## 2021-12-29 DIAGNOSIS — B20 Human immunodeficiency virus [HIV] disease: Secondary | ICD-10-CM | POA: Diagnosis present

## 2021-12-29 DIAGNOSIS — Z8249 Family history of ischemic heart disease and other diseases of the circulatory system: Secondary | ICD-10-CM | POA: Insufficient documentation

## 2021-12-29 DIAGNOSIS — Z713 Dietary counseling and surveillance: Secondary | ICD-10-CM | POA: Diagnosis not present

## 2021-12-29 DIAGNOSIS — F419 Anxiety disorder, unspecified: Secondary | ICD-10-CM | POA: Insufficient documentation

## 2021-12-29 DIAGNOSIS — Z79899 Other long term (current) drug therapy: Secondary | ICD-10-CM | POA: Insufficient documentation

## 2021-12-29 DIAGNOSIS — Z7985 Long-term (current) use of injectable non-insulin antidiabetic drugs: Secondary | ICD-10-CM | POA: Insufficient documentation

## 2021-12-29 DIAGNOSIS — F32A Depression, unspecified: Secondary | ICD-10-CM | POA: Insufficient documentation

## 2021-12-29 DIAGNOSIS — Z7182 Exercise counseling: Secondary | ICD-10-CM | POA: Diagnosis not present

## 2021-12-29 DIAGNOSIS — M545 Low back pain, unspecified: Secondary | ICD-10-CM | POA: Diagnosis not present

## 2021-12-29 DIAGNOSIS — E119 Type 2 diabetes mellitus without complications: Secondary | ICD-10-CM | POA: Diagnosis not present

## 2021-12-29 DIAGNOSIS — R002 Palpitations: Secondary | ICD-10-CM | POA: Diagnosis not present

## 2021-12-29 DIAGNOSIS — J449 Chronic obstructive pulmonary disease, unspecified: Secondary | ICD-10-CM | POA: Insufficient documentation

## 2021-12-29 DIAGNOSIS — M48061 Spinal stenosis, lumbar region without neurogenic claudication: Secondary | ICD-10-CM | POA: Insufficient documentation

## 2021-12-29 DIAGNOSIS — Z7951 Long term (current) use of inhaled steroids: Secondary | ICD-10-CM | POA: Diagnosis not present

## 2021-12-29 DIAGNOSIS — G8929 Other chronic pain: Secondary | ICD-10-CM

## 2021-12-29 DIAGNOSIS — Z21 Asymptomatic human immunodeficiency virus [HIV] infection status: Secondary | ICD-10-CM | POA: Diagnosis present

## 2021-12-29 DIAGNOSIS — Z6841 Body Mass Index (BMI) 40.0 and over, adult: Secondary | ICD-10-CM | POA: Insufficient documentation

## 2021-12-29 DIAGNOSIS — R635 Abnormal weight gain: Secondary | ICD-10-CM | POA: Diagnosis not present

## 2021-12-29 DIAGNOSIS — R079 Chest pain, unspecified: Secondary | ICD-10-CM | POA: Diagnosis not present

## 2021-12-29 DIAGNOSIS — I10 Essential (primary) hypertension: Secondary | ICD-10-CM | POA: Diagnosis not present

## 2021-12-29 DIAGNOSIS — J45909 Unspecified asthma, uncomplicated: Secondary | ICD-10-CM

## 2021-12-29 DIAGNOSIS — Z833 Family history of diabetes mellitus: Secondary | ICD-10-CM | POA: Insufficient documentation

## 2021-12-29 LAB — LIPID PANEL
Cholesterol: 182 mg/dL (ref 0–200)
HDL: 76 mg/dL (ref >40)
LDL Cholesterol: 93 mg/dL (ref 0–99)
Total CHOL/HDL Ratio: 2.4 RATIO
Triglycerides: 66 mg/dL (ref <150)
VLDL: 13 mg/dL (ref 0–40)

## 2021-12-29 LAB — CBC WITH DIFFERENTIAL/PLATELET
Abs Immature Granulocytes: 0.01 10*3/uL (ref 0.00–0.07)
Basophils Absolute: 0 10*3/uL (ref 0.0–0.1)
Basophils Relative: 1 %
Eosinophils Absolute: 0.1 10*3/uL (ref 0.0–0.5)
Eosinophils Relative: 3 %
HCT: 39.5 % (ref 36.0–46.0)
Hemoglobin: 12.9 g/dL (ref 12.0–15.0)
Immature Granulocytes: 0 %
Lymphocytes Relative: 45 %
Lymphs Abs: 1.8 10*3/uL (ref 0.7–4.0)
MCH: 28 pg (ref 26.0–34.0)
MCHC: 32.7 g/dL (ref 30.0–36.0)
MCV: 85.9 fL (ref 80.0–100.0)
Monocytes Absolute: 0.3 10*3/uL (ref 0.1–1.0)
Monocytes Relative: 7 %
Neutro Abs: 1.7 10*3/uL (ref 1.7–7.7)
Neutrophils Relative %: 44 %
Platelets: 266 10*3/uL (ref 150–400)
RBC: 4.6 MIL/uL (ref 3.87–5.11)
RDW: 13.9 % (ref 11.5–15.5)
WBC: 3.9 10*3/uL — ABNORMAL LOW (ref 4.0–10.5)
nRBC: 0 % (ref 0.0–0.2)

## 2021-12-29 LAB — COMPREHENSIVE METABOLIC PANEL
ALT: 10 U/L (ref 0–44)
AST: 13 U/L — ABNORMAL LOW (ref 15–41)
Albumin: 3.9 g/dL (ref 3.5–5.0)
Alkaline Phosphatase: 63 U/L (ref 38–126)
Anion gap: 10 (ref 5–15)
BUN: 9 mg/dL (ref 6–20)
CO2: 24 mmol/L (ref 22–32)
Calcium: 9.8 mg/dL (ref 8.9–10.3)
Chloride: 106 mmol/L (ref 98–111)
Creatinine, Ser: 1.05 mg/dL — ABNORMAL HIGH (ref 0.44–1.00)
GFR, Estimated: >60 mL/min (ref >60)
Glucose, Bld: 91 mg/dL (ref 70–99)
Potassium: 3.8 mmol/L (ref 3.5–5.1)
Sodium: 140 mmol/L (ref 135–145)
Total Bilirubin: 0.8 mg/dL (ref 0.3–1.2)
Total Protein: 8.2 g/dL — ABNORMAL HIGH (ref 6.5–8.1)

## 2021-12-29 LAB — RPR: RPR Ser Ql: NONREACTIVE

## 2021-12-29 LAB — HEPATITIS C ANTIBODY: HCV Ab: NONREACTIVE

## 2021-12-29 MED ORDER — BIKTARVY 50-200-25 MG PO TABS: 1.0000 | ORAL_TABLET | Freq: Every day | ORAL | 3 refills | Status: DC
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
## 2021-12-29 NOTE — Patient Instructions (Signed)
You are here for follow up of HIV- you are on Biktarvy. Will do labs today- will follow up 3 months ?

## 2021-12-30 LAB — T-HELPER CELLS CD4/CD8 %
% CD 4 Pos. Lymph.: 23.9 % — ABNORMAL LOW (ref 30.8–58.5)
Absolute CD 4 Helper: 430 /uL (ref 359–1519)
Basophils Absolute: 0 10*3/uL (ref 0.0–0.2)
Basos: 1 %
CD3+CD4+ Cells/CD3+CD8+ Cells Bld: 0.5 — ABNORMAL LOW (ref 0.92–3.72)
CD3+CD8+ Cells # Bld: 859 /uL (ref 109–897)
CD3+CD8+ Cells NFr Bld: 47.7 % — ABNORMAL HIGH (ref 12.0–35.5)
EOS (ABSOLUTE): 0.1 10*3/uL (ref 0.0–0.4)
Eos: 3 %
Hematocrit: 38.7 % (ref 34.0–46.6)
Hemoglobin: 12.9 g/dL (ref 11.1–15.9)
Immature Grans (Abs): 0 10*3/uL (ref 0.0–0.1)
Immature Granulocytes: 0 %
Lymphocytes Absolute: 1.8 10*3/uL (ref 0.7–3.1)
Lymphs: 46 %
MCH: 28 pg (ref 26.6–33.0)
MCHC: 33.3 g/dL (ref 31.5–35.7)
MCV: 84 fL (ref 79–97)
Monocytes Absolute: 0.3 10*3/uL (ref 0.1–0.9)
Monocytes: 6 %
Neutrophils Absolute: 1.7 10*3/uL (ref 1.4–7.0)
Neutrophils: 44 %
Platelets: 275 10*3/uL (ref 150–450)
RBC: 4.61 x10E6/uL (ref 3.77–5.28)
RDW: 14.4 % (ref 11.7–15.4)
WBC: 3.9 10*3/uL (ref 3.4–10.8)

## 2021-12-31 ENCOUNTER — Telehealth: Payer: Self-pay

## 2021-12-31 LAB — QUANTIFERON-TB GOLD PLUS (RQFGPL)
QuantiFERON Mitogen Value: >10 IU/mL
QuantiFERON Nil Value: 0.69 IU/mL
QuantiFERON TB1 Ag Value: 0.7 IU/mL
QuantiFERON TB2 Ag Value: 0.73 IU/mL

## 2021-12-31 LAB — QUANTIFERON-TB GOLD PLUS: QuantiFERON-TB Gold Plus: NEGATIVE

## 2021-12-31 NOTE — Telephone Encounter (Signed)
Can we look into getting this patient approved for [facility]ial Rancher per Dr. Delaine Lame? ? ? ?

## 2022-01-01 LAB — HIV-1 RNA QUANT-NO REFLEX-BLD
HIV 1 RNA Quant: 140 {copies}/mL
LOG10 HIV-1 RNA: 2.146 {Log_copies}/mL

## 2022-01-05 ENCOUNTER — Other Ambulatory Visit (HOSPITAL_COMMUNITY): Payer: Self-pay

## 2022-04-01 ENCOUNTER — Ambulatory Visit: Payer: Medicare Other | Admitting: Infectious Diseases

## 2022-04-12 ENCOUNTER — Other Ambulatory Visit (HOSPITAL_COMMUNITY): Payer: Self-pay

## 2022-04-12 ENCOUNTER — Other Ambulatory Visit: Payer: Self-pay

## 2022-04-12 DIAGNOSIS — B20 Human immunodeficiency virus [HIV] disease: Secondary | ICD-10-CM

## 2022-04-12 MED ORDER — BIKTARVY 50-200-25 MG PO TABS: 1.0000 | ORAL_TABLET | Freq: Every day | ORAL | 0 refills | Status: DC

## 2022-04-20 ENCOUNTER — Ambulatory Visit: Payer: Medicare Other | Attending: Infectious Diseases | Admitting: Infectious Diseases

## 2022-04-20 ENCOUNTER — Telehealth: Payer: Self-pay | Admitting: Pharmacist

## 2022-04-20 ENCOUNTER — Encounter: Payer: Self-pay | Admitting: Infectious Diseases

## 2022-04-20 ENCOUNTER — Other Ambulatory Visit
Admission: RE | Admit: 2022-04-20 | Discharge: 2022-04-20 | Disposition: A | Discharge status: Home / Self Care | Payer: Medicare Other | Source: Ambulatory Visit | Attending: Infectious Diseases | Admitting: Infectious Diseases

## 2022-04-20 VITALS — BP 115/80 | HR 80 | Temp 97.0°F | Ht 64.0 in | Wt 242.0 lb

## 2022-04-20 DIAGNOSIS — G8929 Other chronic pain: Secondary | ICD-10-CM | POA: Diagnosis not present

## 2022-04-20 DIAGNOSIS — R609 Edema, unspecified: Secondary | ICD-10-CM | POA: Insufficient documentation

## 2022-04-20 DIAGNOSIS — E119 Type 2 diabetes mellitus without complications: Secondary | ICD-10-CM | POA: Diagnosis not present

## 2022-04-20 DIAGNOSIS — I10 Essential (primary) hypertension: Secondary | ICD-10-CM | POA: Diagnosis not present

## 2022-04-20 DIAGNOSIS — Z7951 Long term (current) use of inhaled steroids: Secondary | ICD-10-CM | POA: Insufficient documentation

## 2022-04-20 DIAGNOSIS — J45909 Unspecified asthma, uncomplicated: Secondary | ICD-10-CM | POA: Diagnosis not present

## 2022-04-20 DIAGNOSIS — B20 Human immunodeficiency virus [HIV] disease: Secondary | ICD-10-CM

## 2022-04-20 DIAGNOSIS — G4730 Sleep apnea, unspecified: Secondary | ICD-10-CM | POA: Insufficient documentation

## 2022-04-20 DIAGNOSIS — M545 Low back pain, unspecified: Secondary | ICD-10-CM | POA: Diagnosis not present

## 2022-04-20 DIAGNOSIS — Z7985 Long-term (current) use of injectable non-insulin antidiabetic drugs: Secondary | ICD-10-CM | POA: Insufficient documentation

## 2022-04-20 DIAGNOSIS — I428 Other cardiomyopathies: Secondary | ICD-10-CM | POA: Diagnosis not present

## 2022-04-20 DIAGNOSIS — F419 Anxiety disorder, unspecified: Secondary | ICD-10-CM | POA: Insufficient documentation

## 2022-04-20 DIAGNOSIS — Z79899 Other long term (current) drug therapy: Secondary | ICD-10-CM | POA: Insufficient documentation

## 2022-04-20 NOTE — Telephone Encounter (Signed)
Patient interested in [place] injections; reviewed key counseling as seen below. Dr. Delaine Lame will send prescriptions to [facility].   Counseled that [place] is two separate intramuscular injections in the gluteal muscle on each side for each visit. Explained that the second injection is 30 days after the initial injection then every 2 months thereafter. Discussed the need for viral load monitoring every 2 months for the first 6 months and then periodically afterwards as their provider sees the need. Discussed the rare but significant chance of developing resistance despite compliance. Explained that showing up to injection appointments is very important and warned that if 2 appointments are missed, it will be reassessed by their provider whether they are a good candidate for injection therapy. Counseled on possible side effects associated with the injections such as injection site pain, which is usually mild to moderate in nature, injection site nodules, and injection site reactions. Asked to call the clinic or send me a mychart message if they experience any issues, such as fatigue, nausea, headache, rash, or dizziness. Advised that they can take ibuprofen or tylenol for injection site pain if needed.    Alfonse Spruce, PharmD, CPP, [place] Clinical Pharmacist Practitioner Infectious [place] [facility]

## 2022-04-20 NOTE — Patient Instructions (Addendum)
You are here for follow up HIV- today will do labs- continue Biktarvy  Follow up 4 months
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
## 2022-04-21 LAB — T-HELPER CELLS CD4/CD8 %
% CD 4 Pos. Lymph.: 26.7 % — ABNORMAL LOW (ref 30.8–58.5)
Absolute CD 4 Helper: 427 /uL (ref 359–1519)
Basophils Absolute: 0 10*3/uL (ref 0.0–0.2)
Basos: 1 %
CD3+CD4+ Cells/CD3+CD8+ Cells Bld: 0.59 — ABNORMAL LOW (ref 0.92–3.72)
CD3+CD8+ Cells # Bld: 730 /uL (ref 109–897)
CD3+CD8+ Cells NFr Bld: 45.6 % — ABNORMAL HIGH (ref 12.0–35.5)
EOS (ABSOLUTE): 0.1 10*3/uL (ref 0.0–0.4)
Eos: 2 %
Hematocrit: 38.7 % (ref 34.0–46.6)
Hemoglobin: 12.7 g/dL (ref 11.1–15.9)
Immature Grans (Abs): 0 10*3/uL (ref 0.0–0.1)
Immature Granulocytes: 0 %
Lymphocytes Absolute: 1.6 10*3/uL (ref 0.7–3.1)
Lymphs: 54 %
MCH: 28 pg (ref 26.6–33.0)
MCHC: 32.8 g/dL (ref 31.5–35.7)
MCV: 85 fL (ref 79–97)
Monocytes Absolute: 0.2 10*3/uL (ref 0.1–0.9)
Monocytes: 7 %
Neutrophils Absolute: 1.1 10*3/uL — ABNORMAL LOW (ref 1.4–7.0)
Neutrophils: 36 %
Platelets: 280 10*3/uL (ref 150–450)
RBC: 4.54 x10E6/uL (ref 3.77–5.28)
RDW: 13.3 % (ref 11.7–15.4)
WBC: 3.1 10*3/uL — ABNORMAL LOW (ref 3.4–10.8)

## 2022-04-21 LAB — HIV-1 RNA QUANT-NO REFLEX-BLD
HIV 1 RNA Quant: <20 copies/mL
LOG10 HIV-1 RNA: UNDETERMINED log10copy/mL

## 2022-05-03 ENCOUNTER — Other Ambulatory Visit: Payer: Self-pay | Admitting: Pharmacist

## 2022-05-03 ENCOUNTER — Other Ambulatory Visit (HOSPITAL_COMMUNITY): Payer: Self-pay

## 2022-05-03 DIAGNOSIS — B20 Human immunodeficiency virus [HIV] disease: Secondary | ICD-10-CM

## 2022-05-03 MED ORDER — CABOTEGRAVIR & RILPIVIRINE ER 600 & 900 MG/3ML IM SUER
1.0000 | INTRAMUSCULAR | 5 refills | Status: DC
  Filled 2022-05-03 – 2022-08-02 (×2): qty 6, 60d supply, fill #0
  Filled 2022-10-01: qty 6, 60d supply, fill #1
  Filled 2022-12-06: qty 6, 60d supply, fill #2
  Filled 2023-01-28: qty 6, 60d supply, fill #3
  Filled 2023-03-31: qty 6, 60d supply, fill #4

## 2022-05-03 MED ORDER — CABOTEGRAVIR & RILPIVIRINE ER 600 & 900 MG/3ML IM SUER
1.0000 | INTRAMUSCULAR | 1 refills | Status: DC
  Filled 2022-05-03 (×2): qty 6, 30d supply, fill #0
  Filled 2022-06-02: qty 6, 30d supply, fill #1

## 2022-05-05 ENCOUNTER — Telehealth: Payer: Self-pay

## 2022-05-05 ENCOUNTER — Other Ambulatory Visit (HOSPITAL_COMMUNITY): Payer: Self-pay

## 2022-05-05 DIAGNOSIS — B20 Human immunodeficiency virus [HIV] disease: Secondary | ICD-10-CM

## 2022-05-05 MED ORDER — BIKTARVY 50-200-25 MG PO TABS: 1.0000 | ORAL_TABLET | Freq: Every day | ORAL | 1 refills | Status: DC

## 2022-05-05 NOTE — Telephone Encounter (Signed)
Thanks Megan!

## 2022-05-05 NOTE — Telephone Encounter (Signed)
Patient called requesting refills of Biktarvy, will send in refills to bridge her until [place] can be started.   Beryle Flock, RN

## 2022-05-07 ENCOUNTER — Other Ambulatory Visit (HOSPITAL_COMMUNITY): Payer: Self-pay

## 2022-05-11 ENCOUNTER — Telehealth: Payer: Self-pay

## 2022-05-11 NOTE — Telephone Encounter (Signed)
[facility] Patient Advocate Encounter  Patient's medications have been couriered to [place] from [place] and will be picked up 05/11/22.  Alicia Lynch [facility] Phone: [phone] Fax:  [phone]

## 2022-05-13 ENCOUNTER — Encounter: Payer: Self-pay | Admitting: Infectious Diseases

## 2022-05-13 ENCOUNTER — Ambulatory Visit: Payer: Medicare Other | Attending: Infectious Diseases | Admitting: Infectious Diseases

## 2022-05-13 VITALS — BP 113/81 | HR 69 | Temp 97.5°F | Ht 64.0 in | Wt 241.0 lb

## 2022-05-13 DIAGNOSIS — Z79899 Other long term (current) drug therapy: Secondary | ICD-10-CM | POA: Diagnosis not present

## 2022-05-13 DIAGNOSIS — E119 Type 2 diabetes mellitus without complications: Secondary | ICD-10-CM | POA: Insufficient documentation

## 2022-05-13 DIAGNOSIS — I428 Other cardiomyopathies: Secondary | ICD-10-CM | POA: Insufficient documentation

## 2022-05-13 DIAGNOSIS — B20 Human immunodeficiency virus [HIV] disease: Secondary | ICD-10-CM | POA: Insufficient documentation

## 2022-05-13 DIAGNOSIS — Z21 Asymptomatic human immunodeficiency virus [HIV] infection status: Secondary | ICD-10-CM | POA: Diagnosis not present

## 2022-05-13 DIAGNOSIS — F419 Anxiety disorder, unspecified: Secondary | ICD-10-CM | POA: Diagnosis not present

## 2022-05-13 DIAGNOSIS — Z8249 Family history of ischemic heart disease and other diseases of the circulatory system: Secondary | ICD-10-CM | POA: Insufficient documentation

## 2022-05-13 DIAGNOSIS — F32A Depression, unspecified: Secondary | ICD-10-CM | POA: Insufficient documentation

## 2022-05-13 DIAGNOSIS — J45909 Unspecified asthma, uncomplicated: Secondary | ICD-10-CM | POA: Diagnosis not present

## 2022-05-13 DIAGNOSIS — I10 Essential (primary) hypertension: Secondary | ICD-10-CM | POA: Insufficient documentation

## 2022-05-13 DIAGNOSIS — M545 Low back pain, unspecified: Secondary | ICD-10-CM

## 2022-05-13 DIAGNOSIS — Z7985 Long-term (current) use of injectable non-insulin antidiabetic drugs: Secondary | ICD-10-CM | POA: Insufficient documentation

## 2022-05-13 DIAGNOSIS — G4730 Sleep apnea, unspecified: Secondary | ICD-10-CM | POA: Diagnosis not present

## 2022-05-13 DIAGNOSIS — G8929 Other chronic pain: Secondary | ICD-10-CM | POA: Diagnosis not present

## 2022-05-13 MED ORDER — CABOTEGRAVIR & RILPIVIRINE ER 600 & 900 MG/3ML IM SUER
1.0000 | Freq: Once | INTRAMUSCULAR | Status: AC
  Administered 2022-05-13: 1 via INTRAMUSCULAR
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
## 2022-05-13 NOTE — Patient Instructions (Signed)
You are here to start cabenuva injection today- do not take biktarvy from tomorrow- if you have any pain at the injection site you can use cold compress.

## 2022-05-17 ENCOUNTER — Telehealth: Payer: Self-pay

## 2022-05-17 NOTE — Telephone Encounter (Signed)
Patient called stating that last night she was having chills, night sweats,vomiting, and fever. Patient wanted to know if it could be a side affect to cabenuva. Please advise.

## 2022-06-02 ENCOUNTER — Other Ambulatory Visit (HOSPITAL_COMMUNITY): Payer: Self-pay

## 2022-06-11 ENCOUNTER — Other Ambulatory Visit: Payer: Self-pay | Admitting: Internal Medicine

## 2022-06-11 DIAGNOSIS — R6889 Other general symptoms and signs: Secondary | ICD-10-CM

## 2022-06-14 ENCOUNTER — Telehealth: Payer: Self-pay

## 2022-06-14 NOTE — Telephone Encounter (Signed)
[facility] Patient Advocate Encounter  Patient's medications have been couriered to [facility] from [place] and will be picked up 06/10/22. Alicia Lynch

## 2022-06-14 NOTE — Telephone Encounter (Signed)
Patient left voicemail on 9/8 regarding missed call. Per chart review no calls placed out by office staff. Has appointment tomorrow. Left voicemail with patient stating call was for appointment reminder.  Advised she call office with any additional questions/ concerns.  Leatrice Jewels, RMA

## 2022-06-15 ENCOUNTER — Ambulatory Visit: Payer: Medicare Other | Attending: Infectious Diseases | Admitting: Infectious Diseases

## 2022-06-15 ENCOUNTER — Ambulatory Visit: Payer: Medicare Other | Admitting: Infectious Diseases

## 2022-06-15 ENCOUNTER — Encounter: Payer: Self-pay | Admitting: Infectious Diseases

## 2022-06-15 VITALS — BP 137/66 | HR 89 | Temp 97.6°F | Ht 64.0 in | Wt 247.0 lb

## 2022-06-15 DIAGNOSIS — Z7951 Long term (current) use of inhaled steroids: Secondary | ICD-10-CM | POA: Insufficient documentation

## 2022-06-15 DIAGNOSIS — F32A Depression, unspecified: Secondary | ICD-10-CM | POA: Insufficient documentation

## 2022-06-15 DIAGNOSIS — Z79899 Other long term (current) drug therapy: Secondary | ICD-10-CM | POA: Insufficient documentation

## 2022-06-15 DIAGNOSIS — Z96612 Presence of left artificial shoulder joint: Secondary | ICD-10-CM | POA: Insufficient documentation

## 2022-06-15 DIAGNOSIS — Z7901 Long term (current) use of anticoagulants: Secondary | ICD-10-CM | POA: Diagnosis not present

## 2022-06-15 DIAGNOSIS — E1169 Type 2 diabetes mellitus with other specified complication: Secondary | ICD-10-CM | POA: Diagnosis not present

## 2022-06-15 DIAGNOSIS — J45909 Unspecified asthma, uncomplicated: Secondary | ICD-10-CM | POA: Insufficient documentation

## 2022-06-15 DIAGNOSIS — B20 Human immunodeficiency virus [HIV] disease: Secondary | ICD-10-CM | POA: Diagnosis not present

## 2022-06-15 DIAGNOSIS — F419 Anxiety disorder, unspecified: Secondary | ICD-10-CM | POA: Insufficient documentation

## 2022-06-15 DIAGNOSIS — Z7985 Long-term (current) use of injectable non-insulin antidiabetic drugs: Secondary | ICD-10-CM | POA: Insufficient documentation

## 2022-06-15 DIAGNOSIS — I428 Other cardiomyopathies: Secondary | ICD-10-CM | POA: Insufficient documentation

## 2022-06-15 DIAGNOSIS — M48061 Spinal stenosis, lumbar region without neurogenic claudication: Secondary | ICD-10-CM | POA: Insufficient documentation

## 2022-06-15 DIAGNOSIS — M545 Low back pain, unspecified: Secondary | ICD-10-CM | POA: Insufficient documentation

## 2022-06-15 DIAGNOSIS — I10 Essential (primary) hypertension: Secondary | ICD-10-CM | POA: Diagnosis not present

## 2022-06-15 DIAGNOSIS — G8929 Other chronic pain: Secondary | ICD-10-CM | POA: Diagnosis not present

## 2022-06-15 MED ORDER — CABOTEGRAVIR & RILPIVIRINE ER 600 & 900 MG/3ML IM SUER
1.0000 | Freq: Once | INTRAMUSCULAR | Status: AC
  Administered 2022-06-15: 1 via INTRAMUSCULAR
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
## 2022-06-21 ENCOUNTER — Ambulatory Visit
Admission: RE | Admit: 2022-06-21 | Discharge: 2022-06-21 | Disposition: A | Discharge status: Home / Self Care | Payer: Medicare Other | Source: Ambulatory Visit | Attending: Internal Medicine | Admitting: Internal Medicine

## 2022-06-21 DIAGNOSIS — R6889 Other general symptoms and signs: Secondary | ICD-10-CM

## 2022-07-06 ENCOUNTER — Other Ambulatory Visit: Payer: Self-pay | Admitting: Internal Medicine

## 2022-07-06 DIAGNOSIS — Z1231 Encounter for screening mammogram for malignant neoplasm of breast: Secondary | ICD-10-CM

## 2022-07-16 ENCOUNTER — Ambulatory Visit
Admission: RE | Admit: 2022-07-16 | Discharge: 2022-07-16 | Disposition: A | Discharge status: Home / Self Care | Payer: Medicare Other | Source: Ambulatory Visit | Attending: Internal Medicine | Admitting: Internal Medicine

## 2022-07-16 DIAGNOSIS — Z1231 Encounter for screening mammogram for malignant neoplasm of breast: Secondary | ICD-10-CM

## 2022-07-26 ENCOUNTER — Other Ambulatory Visit (HOSPITAL_COMMUNITY): Payer: Self-pay

## 2022-08-02 ENCOUNTER — Other Ambulatory Visit (HOSPITAL_COMMUNITY): Payer: Self-pay

## 2022-08-03 ENCOUNTER — Other Ambulatory Visit (HOSPITAL_COMMUNITY): Payer: Self-pay

## 2022-08-05 ENCOUNTER — Ambulatory Visit: Payer: Medicare Other | Attending: Infectious Diseases | Admitting: Infectious Diseases

## 2022-08-05 ENCOUNTER — Encounter: Payer: Self-pay | Admitting: Infectious Diseases

## 2022-08-05 VITALS — BP 132/87 | HR 78 | Temp 96.8°F

## 2022-08-05 DIAGNOSIS — F419 Anxiety disorder, unspecified: Secondary | ICD-10-CM | POA: Insufficient documentation

## 2022-08-05 DIAGNOSIS — I509 Heart failure, unspecified: Secondary | ICD-10-CM | POA: Insufficient documentation

## 2022-08-05 DIAGNOSIS — B20 Human immunodeficiency virus [HIV] disease: Secondary | ICD-10-CM | POA: Insufficient documentation

## 2022-08-05 DIAGNOSIS — Z7985 Long-term (current) use of injectable non-insulin antidiabetic drugs: Secondary | ICD-10-CM | POA: Diagnosis not present

## 2022-08-05 DIAGNOSIS — Z79899 Other long term (current) drug therapy: Secondary | ICD-10-CM | POA: Insufficient documentation

## 2022-08-05 DIAGNOSIS — F32A Depression, unspecified: Secondary | ICD-10-CM | POA: Insufficient documentation

## 2022-08-05 DIAGNOSIS — I110 Hypertensive heart disease with heart failure: Secondary | ICD-10-CM | POA: Insufficient documentation

## 2022-08-05 DIAGNOSIS — G8929 Other chronic pain: Secondary | ICD-10-CM | POA: Diagnosis not present

## 2022-08-05 DIAGNOSIS — J4489 Other specified chronic obstructive pulmonary disease: Secondary | ICD-10-CM | POA: Insufficient documentation

## 2022-08-05 DIAGNOSIS — Z96612 Presence of left artificial shoulder joint: Secondary | ICD-10-CM | POA: Diagnosis not present

## 2022-08-05 DIAGNOSIS — M48061 Spinal stenosis, lumbar region without neurogenic claudication: Secondary | ICD-10-CM | POA: Insufficient documentation

## 2022-08-05 DIAGNOSIS — E1169 Type 2 diabetes mellitus with other specified complication: Secondary | ICD-10-CM | POA: Diagnosis not present

## 2022-08-05 MED ORDER — CABOTEGRAVIR & RILPIVIRINE ER 600 & 900 MG/3ML IM SUER
1.0000 | Freq: Once | INTRAMUSCULAR | Status: AC
  Administered 2022-08-05: 1 via INTRAMUSCULAR
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
## 2022-08-12 ENCOUNTER — Ambulatory Visit: Payer: Medicare Other | Admitting: Infectious Diseases

## 2022-08-20 ENCOUNTER — Encounter: Payer: Self-pay | Admitting: Cardiology

## 2022-08-20 ENCOUNTER — Ambulatory Visit: Payer: Medicare Other | Attending: Cardiology | Admitting: Cardiology

## 2022-08-20 VITALS — BP 162/112 | HR 76 | Ht 64.0 in | Wt 255.6 lb

## 2022-08-20 DIAGNOSIS — I10 Essential (primary) hypertension: Secondary | ICD-10-CM

## 2022-08-20 DIAGNOSIS — R072 Precordial pain: Secondary | ICD-10-CM | POA: Diagnosis not present

## 2022-08-20 DIAGNOSIS — R0609 Other forms of dyspnea: Secondary | ICD-10-CM | POA: Diagnosis not present

## 2022-08-20 MED ORDER — LISINOPRIL 20 MG PO TABS: 20.0000 mg | ORAL_TABLET | Freq: Every day | ORAL | 3 refills | Status: AC

## 2022-08-20 MED ORDER — METOPROLOL TARTRATE 100 MG PO TABS: 100.0000 mg | ORAL_TABLET | Freq: Once | ORAL | 0 refills | Status: DC

## 2022-08-20 MED ORDER — IVABRADINE HCL 5 MG PO TABS: 10.0000 mg | ORAL_TABLET | Freq: Once | ORAL | 0 refills | Status: AC
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
## 2022-08-20 NOTE — Patient Instructions (Addendum)
Medication Instructions:   Your physician has recommended you make the following change in your medication:    INCREASE Lisinopril to 20 MG once a day.  *If you need a refill on your cardiac medications before your next appointment, please call your pharmacy*   Lab Work:  Please go to the [facility] after your appointment today for a BMP lab draw.    Testing/Procedures:  Your physician has requested that you have cardiac CT. Cardiac computed tomography (CT) is a painless test that uses an x-ray machine to take clear, detailed pictures of your heart.    Your cardiac CT will be scheduled at:  [facility] [address] [place], [place] [address] [phone]   Please arrive 15 mins early for check-in and test prep.    Please follow these instructions carefully (unless otherwise directed):    Night Before the Test: Be sure to Drink plenty of water. Do not consume any caffeinated/decaffeinated beverages or chocolate 12 hours prior to your test.   On the Day of the Test: Drink plenty of water until 1 hour prior to the test. Do not eat any food 4 hours prior to the test. You may take your regular medications prior to the test.  Take metoprolol (Lopressor) 100 MG two hours prior to test. Take Ivabradine (Corlanor) 10 MG 2 hours prior to your test. FEMALES- please wear underwire-free bra if available, avoid dresses & tight clothing        After the Test: Drink plenty of water. After receiving IV contrast, you may experience a mild flushed feeling. This is normal. On occasion, you may experience a mild rash up to 24 hours after the test. This is not dangerous. If this occurs, you can take Benadryl 25 mg and increase your fluid intake. If you experience trouble breathing, this can be serious. If it is severe call 911 IMMEDIATELY. If it is mild, please call our office. If you take any of these medications:  Glipizide/Metformin, Avandament, Glucavance, please do not take 48 hours after completing test unless otherwise instructed.  Please allow 2-4 weeks for scheduling of routine cardiac CTs. Some insurance companies require a pre-authorization which may delay scheduling of this test.   For non-scheduling related questions, please contact the cardiac imaging nurse navigator should you have any questions/concerns: Marchia Bond, Cardiac Imaging Nurse Navigator Gordy Clement, Cardiac Imaging Nurse Navigator [facility] Direct Office Dial: [phone]   For scheduling needs, including cancellations and rescheduling, please call [place], [phone].     Follow-Up: At [facility], you and your health needs are our priority.  As part of our continuing mission to provide you with exceptional heart care, we have created designated Provider Care Teams.  These Care Teams include your primary Cardiologist (physician) and Advanced Practice Providers (APPs -  Physician Assistants and Nurse Practitioners) who all work together to provide you with the care you need, when you need it.  We recommend signing up for the patient portal called "MyChart".  Sign up information is provided on this After Visit Summary.  MyChart is used to connect with patients for Virtual Visits (Telemedicine).  Patients are able to view lab/test results, encounter notes, upcoming appointments, etc.  Non-urgent messages can be sent to your provider as well.   To learn more about what you can do with MyChart, go to [email or web address].    Your next appointment:    Follow up after testing  (CCTA 09/09/22)  The format for your next appointment:   In Person  Provider:   You may see Kate Sable, MD or one of the following Advanced Practice Providers on your designated Care Team:   Murray Hodgkins, NP Christell Faith, PA-C Cadence Kathlen Mody, PA-C Gerrie Nordmann, NP    Other Instructions  Talk  to your PCP about Ozempic or [facility]  Important Information About Sugar

## 2022-08-24 ENCOUNTER — Ambulatory Visit: Payer: Medicare Other | Admitting: Infectious Diseases

## 2022-08-24 ENCOUNTER — Other Ambulatory Visit
Admission: RE | Admit: 2022-08-24 | Discharge: 2022-08-24 | Disposition: A | Discharge status: Home / Self Care | Payer: Medicare Other | Source: Ambulatory Visit | Attending: Cardiology | Admitting: Cardiology

## 2022-08-24 ENCOUNTER — Other Ambulatory Visit
Admission: RE | Admit: 2022-08-24 | Discharge: 2022-08-24 | Disposition: A | Discharge status: Home / Self Care | Payer: Medicare Other | Source: Ambulatory Visit | Attending: Infectious Diseases | Admitting: Infectious Diseases

## 2022-08-24 ENCOUNTER — Other Ambulatory Visit: Admission: RE | Admit: 2022-08-24 | Payer: Medicare Other | Source: Ambulatory Visit | Admitting: Cardiology

## 2022-08-24 DIAGNOSIS — B20 Human immunodeficiency virus [HIV] disease: Secondary | ICD-10-CM | POA: Insufficient documentation

## 2022-08-24 DIAGNOSIS — R072 Precordial pain: Secondary | ICD-10-CM

## 2022-08-24 LAB — COMPREHENSIVE METABOLIC PANEL
ALT: 9 U/L (ref 0–44)
AST: 15 U/L (ref 15–41)
Albumin: 3.9 g/dL (ref 3.5–5.0)
Alkaline Phosphatase: 56 U/L (ref 38–126)
Anion gap: 8 (ref 5–15)
BUN: 12 mg/dL (ref 6–20)
CO2: 25 mmol/L (ref 22–32)
Calcium: 9.7 mg/dL (ref 8.9–10.3)
Chloride: 109 mmol/L (ref 98–111)
Creatinine, Ser: 1.01 mg/dL — ABNORMAL HIGH (ref 0.44–1.00)
GFR, Estimated: >60 mL/min (ref >60)
Glucose, Bld: 108 mg/dL — ABNORMAL HIGH (ref 70–99)
Potassium: 4.1 mmol/L (ref 3.5–5.1)
Sodium: 142 mmol/L (ref 135–145)
Total Bilirubin: 0.6 mg/dL (ref 0.3–1.2)
Total Protein: 7.7 g/dL (ref 6.5–8.1)

## 2022-08-24 LAB — CBC WITH DIFFERENTIAL/PLATELET
Abs Immature Granulocytes: 0.01 10*3/uL (ref 0.00–0.07)
Basophils Absolute: 0 10*3/uL (ref 0.0–0.1)
Basophils Relative: 1 %
Eosinophils Absolute: 0.1 10*3/uL (ref 0.0–0.5)
Eosinophils Relative: 2 %
HCT: 38 % (ref 36.0–46.0)
Hemoglobin: 12.4 g/dL (ref 12.0–15.0)
Immature Granulocytes: 0 %
Lymphocytes Relative: 47 %
Lymphs Abs: 1.4 10*3/uL (ref 0.7–4.0)
MCH: 27.3 pg (ref 26.0–34.0)
MCHC: 32.6 g/dL (ref 30.0–36.0)
MCV: 83.5 fL (ref 80.0–100.0)
Monocytes Absolute: 0.3 10*3/uL (ref 0.1–1.0)
Monocytes Relative: 8 %
Neutro Abs: 1.3 10*3/uL — ABNORMAL LOW (ref 1.7–7.7)
Neutrophils Relative %: 42 %
Platelets: 200 10*3/uL (ref 150–400)
RBC: 4.55 MIL/uL (ref 3.87–5.11)
RDW: 13.2 % (ref 11.5–15.5)
WBC: 3.1 10*3/uL — ABNORMAL LOW (ref 4.0–10.5)
nRBC: 0 % (ref 0.0–0.2)

## 2022-09-01 ENCOUNTER — Ambulatory Visit (INDEPENDENT_AMBULATORY_CARE_PROVIDER_SITE_OTHER): Payer: Medicare Other | Admitting: Student in an Organized Health Care Education/Training Program

## 2022-09-01 ENCOUNTER — Encounter: Payer: Self-pay | Admitting: Student in an Organized Health Care Education/Training Program

## 2022-09-01 VITALS — BP 130/78 | HR 97 | Temp 98.1°F | Ht 64.0 in | Wt 254.0 lb

## 2022-09-01 DIAGNOSIS — R0602 Shortness of breath: Secondary | ICD-10-CM | POA: Diagnosis not present

## 2022-09-01 LAB — NITRIC OXIDE: Nitric Oxide: 20

## 2022-09-01 MED ORDER — BUDESONIDE-FORMOTEROL FUMARATE 160-4.5 MCG/ACT IN AERO: 2.0000 | INHALATION_SPRAY | Freq: Two times a day (BID) | RESPIRATORY_TRACT | 12 refills | Status: DC
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
## 2022-09-01 NOTE — Patient Instructions (Addendum)
Today, I ordered blood work. You can get them draw at your preferred [facility] draw station. The nearest one to [facility] is at nearby [address] ([address], [place], [place] [address]).  Today, I ordered blood work, a breathing test, a CT scan of your chest, and a sleep study to work up your symptoms. We did also notice a rash under your breast that should respond to topical anti-fungal therapy but I recommend you see your PCP and infectious diseases specialist for that. We also stopped your flovent and spiriva. You will take Symbicort two puffs twice daily (wash your mouth after) and continue your Stioloto Respimat.

## 2022-09-08 ENCOUNTER — Telehealth (HOSPITAL_COMMUNITY): Payer: Self-pay | Admitting: *Deleted

## 2022-09-08 ENCOUNTER — Ambulatory Visit
Admission: RE | Admit: 2022-09-08 | Discharge: 2022-09-08 | Disposition: A | Discharge status: Home / Self Care | Payer: Medicare Other | Source: Ambulatory Visit | Attending: Student in an Organized Health Care Education/Training Program | Admitting: Student in an Organized Health Care Education/Training Program

## 2022-09-08 DIAGNOSIS — R0602 Shortness of breath: Secondary | ICD-10-CM | POA: Insufficient documentation

## 2022-09-08 NOTE — Telephone Encounter (Signed)
Reaching out to patient to offer assistance regarding upcoming cardiac imaging study; pt verbalizes understanding of appt date/time, parking situation and where to check in, pre-test NPO status and medications ordered, and verified current allergies; name and call back number provided for further questions should they arise  Anyah Swallow RN Navigator Cardiac Imaging [facility] [phone] office [phone] cell  Patient to take 100mg metoprolol tartrate and 10mg ivabradine two hours prior to her cardiac CT scan. 

## 2022-09-09 ENCOUNTER — Ambulatory Visit
Admission: RE | Admit: 2022-09-09 | Discharge: 2022-09-09 | Disposition: A | Discharge status: Home / Self Care | Payer: Medicare Other | Source: Ambulatory Visit | Attending: Cardiology | Admitting: Cardiology

## 2022-09-09 ENCOUNTER — Other Ambulatory Visit (HOSPITAL_COMMUNITY): Payer: Self-pay | Admitting: *Deleted

## 2022-09-09 DIAGNOSIS — R072 Precordial pain: Secondary | ICD-10-CM | POA: Diagnosis present

## 2022-09-09 MED ORDER — IOHEXOL 350 MG/ML SOLN
100.0000 mL | Freq: Once | INTRAVENOUS | Status: AC | PRN
  Administered 2022-09-09: 100 mL via INTRAVENOUS

## 2022-09-09 MED ORDER — METOPROLOL TARTRATE 5 MG/5ML IV SOLN
10.0000 mg | Freq: Once | INTRAVENOUS | Status: AC
  Administered 2022-09-09: 10 mg via INTRAVENOUS

## 2022-09-09 MED ORDER — NITROGLYCERIN 0.4 MG SL SUBL
0.8000 mg | SUBLINGUAL_TABLET | Freq: Once | SUBLINGUAL | Status: AC
  Administered 2022-09-09: 0.8 mg via SUBLINGUAL

## 2022-09-09 MED ORDER — DILTIAZEM HCL 25 MG/5ML IV SOLN
10.0000 mg | Freq: Once | INTRAVENOUS | Status: AC
  Administered 2022-09-09: 10 mg via INTRAVENOUS

## 2022-09-09 MED ORDER — METOPROLOL TARTRATE 100 MG PO TABS: 100.0000 mg | ORAL_TABLET | Freq: Once | ORAL | 0 refills | Status: DC

## 2022-09-09 MED ORDER — METOPROLOL TARTRATE 5 MG/5ML IV SOLN: 10.0000 mg | Freq: Once | INTRAVENOUS | Status: DC

## 2022-09-09 MED ORDER — IVABRADINE HCL 5 MG PO TABS: ORAL_TABLET | ORAL | 0 refills | Status: DC

## 2022-09-09 NOTE — Progress Notes (Signed)
Patient arrived for cardiac CT. Was ordered Metoprolol '100mg'$  PO and Ivabradine '10mg'$  PO. Only took Metoprolol '100mg'$  PO because pharmacy did not have Ivabradine. Attempted to decrease heart rate with IV medication. Medication did decrease heart rate briefly but not enough to perform the cardiac CT. CT Technologist notified nurse navigator and rescheduled patient for next Thursday   12/7 at 3:30pm and took take both medications 2 hours prior to arrival. Verbalized understanding plan of care.

## 2022-09-10 LAB — CBC WITH DIFFERENTIAL/PLATELET
Basophils Absolute: 0 10*3/uL (ref 0.0–0.2)
Basos: 1 %
EOS (ABSOLUTE): 0.1 10*3/uL (ref 0.0–0.4)
Eos: 2 %
Hematocrit: 39 % (ref 34.0–46.6)
Hemoglobin: 12.9 g/dL (ref 11.1–15.9)
Immature Grans (Abs): 0 10*3/uL (ref 0.0–0.1)
Immature Granulocytes: 0 %
Lymphocytes Absolute: 1.4 10*3/uL (ref 0.7–3.1)
Lymphs: 45 %
MCH: 27.5 pg (ref 26.6–33.0)
MCHC: 33.1 g/dL (ref 31.5–35.7)
MCV: 83 fL (ref 79–97)
Monocytes Absolute: 0.2 10*3/uL (ref 0.1–0.9)
Monocytes: 7 %
Neutrophils Absolute: 1.4 10*3/uL (ref 1.4–7.0)
Neutrophils: 45 %
Platelets: 225 10*3/uL (ref 150–450)
RBC: 4.69 x10E6/uL (ref 3.77–5.28)
RDW: 12.7 % (ref 11.7–15.4)
WBC: 3.1 10*3/uL — ABNORMAL LOW (ref 3.4–10.8)

## 2022-09-10 LAB — ALLERGEN PANEL (27) + IGE
Alternaria Alternata IgE: <0.1 kU/L
Aspergillus Fumigatus IgE: <0.1 kU/L
Bahia Grass IgE: <0.1 kU/L
Bermuda Grass IgE: <0.1 kU/L
Cat Dander IgE: <0.1 kU/L
Cedar, Mountain IgE: <0.1 kU/L
Cladosporium Herbarum IgE: <0.1 kU/L
Cocklebur IgE: <0.1 kU/L
Cockroach, American IgE: <0.1 kU/L
Common Silver Birch IgE: <0.1 kU/L
D Farinae IgE: <0.1 kU/L
D Pteronyssinus IgE: <0.1 kU/L
Dog Dander IgE: <0.1 kU/L
Elm, American IgE: <0.1 kU/L
Hickory, White IgE: <0.1 kU/L
IgE (Immunoglobulin E), Serum: 8 IU/mL (ref 6–495)
Johnson Grass IgE: <0.1 kU/L
Kentucky Bluegrass IgE: <0.1 kU/L
Maple/Box Elder IgE: <0.1 kU/L
Mucor Racemosus IgE: <0.1 kU/L
Oak, White IgE: <0.1 kU/L
Penicillium Chrysogen IgE: <0.1 kU/L
Pigweed, Rough IgE: <0.1 kU/L
Plantain, English IgE: <0.1 kU/L
Ragweed, Short IgE: <0.1 kU/L
Setomelanomma Rostrat: <0.1 kU/L
Timothy Grass IgE: <0.1 kU/L
White Mulberry IgE: <0.1 kU/L
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
## 2022-09-13 ENCOUNTER — Encounter: Payer: Self-pay | Admitting: Cardiology

## 2022-09-13 ENCOUNTER — Other Ambulatory Visit
Admission: RE | Admit: 2022-09-13 | Discharge: 2022-09-13 | Disposition: A | Discharge status: Home / Self Care | Payer: Medicare Other | Source: Ambulatory Visit | Attending: Cardiology | Admitting: Cardiology

## 2022-09-13 ENCOUNTER — Ambulatory Visit: Payer: Medicare Other | Attending: Cardiology | Admitting: Cardiology

## 2022-09-13 VITALS — BP 154/90 | HR 69 | Ht 64.0 in | Wt 252.6 lb

## 2022-09-13 DIAGNOSIS — R609 Edema, unspecified: Secondary | ICD-10-CM

## 2022-09-13 DIAGNOSIS — B20 Human immunodeficiency virus [HIV] disease: Secondary | ICD-10-CM

## 2022-09-13 DIAGNOSIS — Z79899 Other long term (current) drug therapy: Secondary | ICD-10-CM

## 2022-09-13 DIAGNOSIS — I10 Essential (primary) hypertension: Secondary | ICD-10-CM | POA: Diagnosis not present

## 2022-09-13 DIAGNOSIS — R0609 Other forms of dyspnea: Secondary | ICD-10-CM

## 2022-09-13 DIAGNOSIS — Z21 Asymptomatic human immunodeficiency virus [HIV] infection status: Secondary | ICD-10-CM

## 2022-09-13 DIAGNOSIS — R600 Localized edema: Secondary | ICD-10-CM

## 2022-09-13 DIAGNOSIS — Z01812 Encounter for preprocedural laboratory examination: Secondary | ICD-10-CM | POA: Diagnosis present

## 2022-09-13 DIAGNOSIS — R079 Chest pain, unspecified: Secondary | ICD-10-CM

## 2022-09-13 LAB — COMPREHENSIVE METABOLIC PANEL
ALT: 8 U/L (ref 0–44)
AST: 12 U/L — ABNORMAL LOW (ref 15–41)
Albumin: 4.1 g/dL (ref 3.5–5.0)
Alkaline Phosphatase: 62 U/L (ref 38–126)
Anion gap: 4 — ABNORMAL LOW (ref 5–15)
BUN: 13 mg/dL (ref 6–20)
CO2: 27 mmol/L (ref 22–32)
Calcium: 9.6 mg/dL (ref 8.9–10.3)
Chloride: 109 mmol/L (ref 98–111)
Creatinine, Ser: 1.05 mg/dL — ABNORMAL HIGH (ref 0.44–1.00)
GFR, Estimated: >60 mL/min (ref >60)
Glucose, Bld: 98 mg/dL (ref 70–99)
Potassium: 4.2 mmol/L (ref 3.5–5.1)
Sodium: 140 mmol/L (ref 135–145)
Total Bilirubin: 0.7 mg/dL (ref 0.3–1.2)
Total Protein: 8.1 g/dL (ref 6.5–8.1)

## 2022-09-13 LAB — TSH: TSH: 0.63 u[IU]/mL (ref 0.350–4.500)

## 2022-09-13 NOTE — Patient Instructions (Signed)
Medication Instructions:  Your Physician recommend you continue on your current medication as directed.    *If you need a refill on your cardiac medications before your next appointment, please call your pharmacy*   Lab Work: Your provider would like for you to have following labs drawn today: (CMP, TSH).   Please go to the [facility] entrance and check in at the front desk.  You do not need an appointment.  They are open from 7am-6 pm.   If you have labs (blood work) drawn today and your tests are completely normal, you will receive your results only by: [place] (if you have MyChart) OR A paper copy in the mail If you have any lab test that is abnormal or we need to change your treatment, we will call you to review the results.   Testing/Procedures: None ordered today   Follow-Up: At [facility], you and your health needs are our priority.  As part of our continuing mission to provide you with exceptional heart care, we have created designated Provider Care Teams.  These Care Teams include your primary Cardiologist (physician) and Advanced Practice Providers (APPs -  Physician Assistants and Nurse Practitioners) who all work together to provide you with the care you need, when you need it.  We recommend signing up for the patient portal called "MyChart".  Sign up information is provided on this After Visit Summary.  MyChart is used to connect with patients for Virtual Visits (Telemedicine).  Patients are able to view lab/test results, encounter notes, upcoming appointments, etc.  Non-urgent messages can be sent to your provider as well.   To learn more about what you can do with MyChart, go to [email or web address].    Your next appointment:   1 month(s)  The format for your next appointment:   In Person  Provider:   You may see Kate Sable, MD or one of the following Advanced Practice Providers on your designated Care Team:   Murray Hodgkins,  NP Christell Faith, PA-C Cadence Kathlen Mody, PA-C Gerrie Nordmann, NP

## 2022-09-13 NOTE — Progress Notes (Signed)
Kidney function has remained stable. TSH was previously low and is now normal.

## 2022-09-15 ENCOUNTER — Telehealth (HOSPITAL_COMMUNITY): Payer: Self-pay | Admitting: Emergency Medicine

## 2022-09-15 NOTE — Telephone Encounter (Signed)
Pt states she does not have the funds to pay for ivabradine for tomorrows exam. Wants to postpone appt til after holidays. New appt made for 10/11/21 at 1:45pm at [facility]. Marchia Bond RN Navigator Cardiac Imaging [facility] [phone] Office  [phone] Cell

## 2022-09-16 ENCOUNTER — Ambulatory Visit: Payer: Medicare Other

## 2022-09-17 ENCOUNTER — Ambulatory Visit: Payer: Medicaid Other | Admitting: Nurse Practitioner

## 2022-10-01 ENCOUNTER — Other Ambulatory Visit: Payer: Self-pay

## 2022-10-01 ENCOUNTER — Other Ambulatory Visit (HOSPITAL_COMMUNITY): Payer: Self-pay

## 2022-10-05 ENCOUNTER — Telehealth: Payer: Self-pay | Admitting: Student in an Organized Health Care Education/Training Program

## 2022-10-05 ENCOUNTER — Other Ambulatory Visit: Payer: Self-pay

## 2022-10-05 ENCOUNTER — Other Ambulatory Visit (HOSPITAL_COMMUNITY): Payer: Self-pay

## 2022-10-05 NOTE — Telephone Encounter (Signed)
PT calling regarding no one has called her to set up a sleep study. In looking at her chart I see she was seen in Nov and Dr was to order rads, Sleep study and PFT. Please contact to advise PT what order of testing the Dr. Geraldine Solar and to arrange testing. TY.

## 2022-10-05 NOTE — Telephone Encounter (Signed)
Anita, please advise. Thanks 

## 2022-10-07 ENCOUNTER — Other Ambulatory Visit (HOSPITAL_COMMUNITY): Payer: Self-pay

## 2022-10-08 ENCOUNTER — Telehealth (HOSPITAL_COMMUNITY): Payer: Self-pay | Admitting: *Deleted

## 2022-10-08 NOTE — Telephone Encounter (Signed)
Reaching out to patient to offer assistance regarding upcoming cardiac imaging study; pt verbalizes understanding of appt date/time, parking situation and where to check in, pre-test NPO status and medications ordered, and verified current allergies; name and call back number provided for further questions should they arise  Alicia Swim RN Navigator Cardiac Imaging [facility] [phone] office [phone] cell  Patient to take 100mg metoprolol tartrate and 10mg ivabradine two hours prior to her cardiac CT scan. 

## 2022-10-08 NOTE — Telephone Encounter (Signed)
I have spoke with Alicia Lynch and her HST has been scheduled to be picked up on on 10/18/22 @ 8:30am and her PFT has been scheduled on 10/26/22 @ [place] Entrance

## 2022-10-11 ENCOUNTER — Ambulatory Visit
Admission: RE | Admit: 2022-10-11 | Discharge: 2022-10-11 | Disposition: A | Discharge status: Home / Self Care | Payer: Medicare Other | Source: Ambulatory Visit | Attending: Cardiology | Admitting: Cardiology

## 2022-10-11 DIAGNOSIS — R072 Precordial pain: Secondary | ICD-10-CM | POA: Insufficient documentation

## 2022-10-11 MED ORDER — NITROGLYCERIN 0.4 MG SL SUBL
0.8000 mg | SUBLINGUAL_TABLET | Freq: Once | SUBLINGUAL | Status: AC
  Administered 2022-10-11: 0.8 mg via SUBLINGUAL

## 2022-10-11 MED ORDER — METOPROLOL TARTRATE 5 MG/5ML IV SOLN: 10.0000 mg | Freq: Once | INTRAVENOUS | Status: DC

## 2022-10-11 MED ORDER — METOPROLOL TARTRATE 5 MG/5ML IV SOLN
10.0000 mg | Freq: Once | INTRAVENOUS | Status: AC
  Administered 2022-10-11: 10 mg via INTRAVENOUS

## 2022-10-11 MED ORDER — DILTIAZEM HCL 25 MG/5ML IV SOLN
INTRAVENOUS | Status: AC
  Administered 2022-10-11: 10 mg
  Filled 2022-10-11: qty 5

## 2022-10-11 MED ORDER — METOPROLOL TARTRATE 5 MG/5ML IV SOLN
INTRAVENOUS | Status: AC
  Administered 2022-10-11: 10 mg
  Filled 2022-10-11: qty 10

## 2022-10-11 MED ORDER — DILTIAZEM HCL 25 MG/5ML IV SOLN
10.0000 mg | Freq: Once | INTRAVENOUS | Status: AC
  Administered 2022-10-11: 10 mg via INTRAVENOUS

## 2022-10-11 MED ORDER — IOHEXOL 350 MG/ML SOLN
100.0000 mL | Freq: Once | INTRAVENOUS | Status: AC | PRN
  Administered 2022-10-11: 100 mL via INTRAVENOUS

## 2022-10-11 NOTE — Progress Notes (Signed)

## 2022-10-14 ENCOUNTER — Ambulatory Visit: Payer: 59 | Attending: Infectious Diseases | Admitting: Infectious Diseases

## 2022-10-14 ENCOUNTER — Other Ambulatory Visit
Admission: RE | Admit: 2022-10-14 | Discharge: 2022-10-14 | Disposition: A | Discharge status: Home / Self Care | Payer: 59 | Attending: Infectious Diseases | Admitting: Infectious Diseases

## 2022-10-14 VITALS — BP 149/95 | HR 84 | Temp 97.3°F | Wt 260.0 lb

## 2022-10-14 DIAGNOSIS — Z885 Allergy status to narcotic agent status: Secondary | ICD-10-CM | POA: Insufficient documentation

## 2022-10-14 DIAGNOSIS — Z7951 Long term (current) use of inhaled steroids: Secondary | ICD-10-CM | POA: Diagnosis not present

## 2022-10-14 DIAGNOSIS — Z791 Long term (current) use of non-steroidal anti-inflammatories (NSAID): Secondary | ICD-10-CM | POA: Diagnosis not present

## 2022-10-14 DIAGNOSIS — Z96612 Presence of left artificial shoulder joint: Secondary | ICD-10-CM | POA: Insufficient documentation

## 2022-10-14 DIAGNOSIS — Z23 Encounter for immunization: Secondary | ICD-10-CM

## 2022-10-14 DIAGNOSIS — F32A Depression, unspecified: Secondary | ICD-10-CM | POA: Insufficient documentation

## 2022-10-14 DIAGNOSIS — G4730 Sleep apnea, unspecified: Secondary | ICD-10-CM | POA: Insufficient documentation

## 2022-10-14 DIAGNOSIS — Z21 Asymptomatic human immunodeficiency virus [HIV] infection status: Secondary | ICD-10-CM | POA: Diagnosis present

## 2022-10-14 DIAGNOSIS — I10 Essential (primary) hypertension: Secondary | ICD-10-CM | POA: Insufficient documentation

## 2022-10-14 DIAGNOSIS — I428 Other cardiomyopathies: Secondary | ICD-10-CM | POA: Insufficient documentation

## 2022-10-14 DIAGNOSIS — Z7985 Long-term (current) use of injectable non-insulin antidiabetic drugs: Secondary | ICD-10-CM | POA: Diagnosis not present

## 2022-10-14 DIAGNOSIS — B20 Human immunodeficiency virus [HIV] disease: Secondary | ICD-10-CM | POA: Diagnosis present

## 2022-10-14 DIAGNOSIS — E1169 Type 2 diabetes mellitus with other specified complication: Secondary | ICD-10-CM | POA: Diagnosis not present

## 2022-10-14 DIAGNOSIS — Z79899 Other long term (current) drug therapy: Secondary | ICD-10-CM | POA: Insufficient documentation

## 2022-10-14 DIAGNOSIS — J4489 Other specified chronic obstructive pulmonary disease: Secondary | ICD-10-CM | POA: Diagnosis not present

## 2022-10-14 LAB — CBC WITH DIFFERENTIAL/PLATELET
Abs Immature Granulocytes: 0.01 10*3/uL (ref 0.00–0.07)
Basophils Absolute: 0 10*3/uL (ref 0.0–0.1)
Basophils Relative: 1 %
Eosinophils Absolute: 0.1 10*3/uL (ref 0.0–0.5)
Eosinophils Relative: 2 %
HCT: 39.6 % (ref 36.0–46.0)
Hemoglobin: 12.8 g/dL (ref 12.0–15.0)
Immature Granulocytes: 0 %
Lymphocytes Relative: 48 %
Lymphs Abs: 1.4 10*3/uL (ref 0.7–4.0)
MCH: 27.2 pg (ref 26.0–34.0)
MCHC: 32.3 g/dL (ref 30.0–36.0)
MCV: 84.3 fL (ref 80.0–100.0)
Monocytes Absolute: 0.2 10*3/uL (ref 0.1–1.0)
Monocytes Relative: 7 %
Neutro Abs: 1.2 10*3/uL — ABNORMAL LOW (ref 1.7–7.7)
Neutrophils Relative %: 42 %
Platelets: 211 10*3/uL (ref 150–400)
RBC: 4.7 MIL/uL (ref 3.87–5.11)
RDW: 13.8 % (ref 11.5–15.5)
WBC: 3 10*3/uL — ABNORMAL LOW (ref 4.0–10.5)
nRBC: 0 % (ref 0.0–0.2)

## 2022-10-14 LAB — LIPID PANEL
Cholesterol: 220 mg/dL — ABNORMAL HIGH (ref 0–200)
HDL: 107 mg/dL (ref >40)
LDL Cholesterol: 103 mg/dL — ABNORMAL HIGH (ref 0–99)
Total CHOL/HDL Ratio: 2.1 RATIO
Triglycerides: 50 mg/dL (ref <150)
VLDL: 10 mg/dL (ref 0–40)

## 2022-10-14 LAB — COMPREHENSIVE METABOLIC PANEL
ALT: 10 U/L (ref 0–44)
AST: 14 U/L — ABNORMAL LOW (ref 15–41)
Albumin: 3.9 g/dL (ref 3.5–5.0)
Alkaline Phosphatase: 52 U/L (ref 38–126)
Anion gap: 7 (ref 5–15)
BUN: 12 mg/dL (ref 6–20)
CO2: 27 mmol/L (ref 22–32)
Calcium: 9.4 mg/dL (ref 8.9–10.3)
Chloride: 106 mmol/L (ref 98–111)
Creatinine, Ser: 1.05 mg/dL — ABNORMAL HIGH (ref 0.44–1.00)
GFR, Estimated: >60 mL/min (ref >60)
Glucose, Bld: 82 mg/dL (ref 70–99)
Potassium: 3.9 mmol/L (ref 3.5–5.1)
Sodium: 140 mmol/L (ref 135–145)
Total Bilirubin: 0.7 mg/dL (ref 0.3–1.2)
Total Protein: 7.6 g/dL (ref 6.5–8.1)

## 2022-10-14 MED ORDER — CABOTEGRAVIR & RILPIVIRINE ER 600 & 900 MG/3ML IM SUER
1.0000 | Freq: Once | INTRAMUSCULAR | Status: AC
  Administered 2022-10-14: 1 via INTRAMUSCULAR

## 2022-10-14 MED ORDER — CLOTRIMAZOLE 1 % EX CREA: 1.0000 | TOPICAL_CREAM | Freq: Two times a day (BID) | CUTANEOUS | 0 refills | Status: AC
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
## 2022-10-15 LAB — T-HELPER CELLS CD4/CD8 %
% CD 4 Pos. Lymph.: 25.8 % — ABNORMAL LOW (ref 30.8–58.5)
Absolute CD 4 Helper: 387 /uL (ref 359–1519)
Basophils Absolute: 0 10*3/uL (ref 0.0–0.2)
Basos: 1 %
CD3+CD4+ Cells/CD3+CD8+ Cells Bld: 0.58 — ABNORMAL LOW (ref 0.92–3.72)
CD3+CD8+ Cells # Bld: 669 /uL (ref 109–897)
CD3+CD8+ Cells NFr Bld: 44.6 % — ABNORMAL HIGH (ref 12.0–35.5)
EOS (ABSOLUTE): 0.1 10*3/uL (ref 0.0–0.4)
Eos: 2 %
Hematocrit: 38.4 % (ref 34.0–46.6)
Hemoglobin: 12.5 g/dL (ref 11.1–15.9)
Immature Grans (Abs): 0 10*3/uL (ref 0.0–0.1)
Immature Granulocytes: 1 %
Lymphocytes Absolute: 1.5 10*3/uL (ref 0.7–3.1)
Lymphs: 48 %
MCH: 27 pg (ref 26.6–33.0)
MCHC: 32.6 g/dL (ref 31.5–35.7)
MCV: 83 fL (ref 79–97)
Monocytes Absolute: 0.3 10*3/uL (ref 0.1–0.9)
Monocytes: 9 %
Neutrophils Absolute: 1.2 10*3/uL — ABNORMAL LOW (ref 1.4–7.0)
Neutrophils: 39 %
Platelets: 222 10*3/uL (ref 150–450)
RBC: 4.63 x10E6/uL (ref 3.77–5.28)
RDW: 13.6 % (ref 11.7–15.4)
WBC: 3.1 10*3/uL — ABNORMAL LOW (ref 3.4–10.8)

## 2022-10-15 LAB — HIV-1 RNA QUANT-NO REFLEX-BLD
HIV 1 RNA Quant: <20 copies/mL
LOG10 HIV-1 RNA: UNDETERMINED log10copy/mL

## 2022-10-15 LAB — RPR: RPR Ser Ql: NONREACTIVE

## 2022-10-18 ENCOUNTER — Ambulatory Visit: Payer: 59

## 2022-10-18 DIAGNOSIS — G4733 Obstructive sleep apnea (adult) (pediatric): Secondary | ICD-10-CM | POA: Diagnosis not present

## 2022-10-18 DIAGNOSIS — R0602 Shortness of breath: Secondary | ICD-10-CM

## 2022-10-19 ENCOUNTER — Telehealth: Payer: Self-pay

## 2022-10-19 LAB — QUANTIFERON-TB GOLD PLUS: QuantiFERON-TB Gold Plus: NEGATIVE

## 2022-10-19 LAB — QUANTIFERON-TB GOLD PLUS (RQFGPL)
QuantiFERON Mitogen Value: >10 IU/mL
QuantiFERON Nil Value: 0.2 IU/mL
QuantiFERON TB1 Ag Value: 0.37 IU/mL
QuantiFERON TB2 Ag Value: 0.29 IU/mL

## 2022-10-19 NOTE — Telephone Encounter (Signed)
-----  Message from Tsosie Billing, MD sent at 10/18/2022  4:37 PM EST ----- Please let her know Vl < 20 and cd4 is very good as well- thx  ----- Message ----- From: Interface, Lab In [place] Sent: 10/14/2022   9:42 AM EST To: Tsosie Billing, MD
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
## 2022-10-22 ENCOUNTER — Encounter: Payer: Self-pay | Admitting: Cardiology

## 2022-10-22 ENCOUNTER — Telehealth: Payer: Self-pay | Admitting: Student in an Organized Health Care Education/Training Program

## 2022-10-22 ENCOUNTER — Ambulatory Visit: Payer: 59 | Attending: Cardiology | Admitting: Cardiology

## 2022-10-22 VITALS — BP 122/88 | HR 78 | Ht 64.0 in | Wt 259.2 lb

## 2022-10-22 DIAGNOSIS — R609 Edema, unspecified: Secondary | ICD-10-CM

## 2022-10-22 DIAGNOSIS — Z79899 Other long term (current) drug therapy: Secondary | ICD-10-CM

## 2022-10-22 DIAGNOSIS — R072 Precordial pain: Secondary | ICD-10-CM

## 2022-10-22 DIAGNOSIS — I10 Essential (primary) hypertension: Secondary | ICD-10-CM

## 2022-10-22 DIAGNOSIS — R0609 Other forms of dyspnea: Secondary | ICD-10-CM | POA: Diagnosis not present

## 2022-10-22 DIAGNOSIS — E782 Mixed hyperlipidemia: Secondary | ICD-10-CM

## 2022-10-22 DIAGNOSIS — B20 Human immunodeficiency virus [HIV] disease: Secondary | ICD-10-CM

## 2022-10-22 DIAGNOSIS — R600 Localized edema: Secondary | ICD-10-CM

## 2022-10-22 DIAGNOSIS — Z21 Asymptomatic human immunodeficiency virus [HIV] infection status: Secondary | ICD-10-CM

## 2022-10-22 MED ORDER — OMEPRAZOLE 20 MG PO CPDR: 20.0000 mg | DELAYED_RELEASE_CAPSULE | Freq: Every day | ORAL | 3 refills | Status: AC

## 2022-10-22 MED ORDER — ROSUVASTATIN CALCIUM 5 MG PO TABS: 5.0000 mg | ORAL_TABLET | Freq: Every day | ORAL | 3 refills | Status: DC

## 2022-10-22 NOTE — Patient Instructions (Signed)
Medication Instructions:  Your physician has recommended you make the following change in your medication:   START Omeprazole 20 mg once a day START Rosuvastatin 5 mg once a day  *If you need a refill on your cardiac medications before your next appointment, please call your pharmacy*   Lab Work: Lipid & Liver labs to be done in 8-10 weeks. No appointment is needed for this to be done. Go to the following location:  [facility] Entrance at [facility] 1st desk on the right to check in (REGISTRATION)  Lab hours: Monday- Friday (7:30 am- 5:30 pm)   If you have labs (blood work) drawn today and your tests are completely normal, you will receive your results only by: [place] (if you have MyChart) OR A paper copy in the mail If you have any lab test that is abnormal or we need to change your treatment, we will call you to review the results.   Testing/Procedures: None   Follow-Up: At [facility], you and your health needs are our priority.  As part of our continuing mission to provide you with exceptional heart care, we have created designated Provider Care Teams.  These Care Teams include your primary Cardiologist (physician) and Advanced Practice Providers (APPs -  Physician Assistants and Nurse Practitioners) who all work together to provide you with the care you need, when you need it.   Your next appointment:   3 month(s)  Provider:   Kate Sable, MD or Gerrie Nordmann, NP

## 2022-10-22 NOTE — Telephone Encounter (Signed)
Spoke to patient.  She is requesting HST results. She is aware that study has not been ready by sleep provider yet. Advised her that our office would contact for with results once read by sleep provider and reviewed by Dr. Genia Harold. She voiced her understanding.  Nothing further needed.

## 2022-10-26 ENCOUNTER — Ambulatory Visit: Payer: 59 | Attending: Student in an Organized Health Care Education/Training Program

## 2022-10-26 DIAGNOSIS — R0602 Shortness of breath: Secondary | ICD-10-CM | POA: Diagnosis not present

## 2022-10-26 DIAGNOSIS — R0600 Dyspnea, unspecified: Secondary | ICD-10-CM | POA: Insufficient documentation

## 2022-10-26 DIAGNOSIS — J45909 Unspecified asthma, uncomplicated: Secondary | ICD-10-CM | POA: Insufficient documentation

## 2022-10-26 LAB — PULMONARY FUNCTION TEST ARMC ONLY
DL/VA % pred: 88 %
DL/VA: 3.79 ml/min/mmHg/L
DLCO unc % pred: 59 %
DLCO unc: 12.41 ml/min/mmHg
FEF 25-75 Post: 1.49 L/sec
FEF 25-75 Pre: 1.39 L/sec
FEF2575-%Change-Post: 7 %
FEF2575-%Pred-Post: 57 %
FEF2575-%Pred-Pre: 53 %
FEV1-%Change-Post: 1 %
FEV1-%Pred-Post: 58 %
FEV1-%Pred-Pre: 57 %
FEV1-Post: 1.59 L
FEV1-Pre: 1.56 L
FEV1FVC-%Change-Post: 0 %
FEV1FVC-%Pred-Pre: 104 %
FEV6-%Change-Post: 2 %
FEV6-%Pred-Post: 57 %
FEV6-%Pred-Pre: 56 %
FEV6-Post: 1.94 L
FEV6-Pre: 1.89 L
FEV6FVC-%Pred-Post: 103 %
FEV6FVC-%Pred-Pre: 103 %
FVC-%Change-Post: 2 %
FVC-%Pred-Post: 56 %
FVC-%Pred-Pre: 54 %
FVC-Post: 1.94 L
FVC-Pre: 1.89 L
Post FEV1/FVC ratio: 82 %
Post FEV6/FVC ratio: 100 %
Pre FEV1/FVC ratio: 83 %
Pre FEV6/FVC Ratio: 100 %
RV % pred: 87 %
RV: 1.63 L
TLC % pred: 70 %
TLC: 3.56 L

## 2022-10-26 MED ORDER — ALBUTEROL SULFATE (2.5 MG/3ML) 0.083% IN NEBU
2.5000 mg | INHALATION_SOLUTION | Freq: Once | RESPIRATORY_TRACT | Status: AC
  Administered 2022-10-26: 2.5 mg via RESPIRATORY_TRACT
  Filled 2022-10-26: qty 3

## 2022-10-28 ENCOUNTER — Telehealth: Payer: Self-pay | Admitting: Student in an Organized Health Care Education/Training Program

## 2022-10-28 NOTE — Telephone Encounter (Signed)
Spoke to patient.  She is requesting PFT and HST results.  She is aware that HST has not been read yet. Sleep provider is out of the office.   Dr. Genia Harold, please advise on PFT results. Thanks

## 2022-10-29 ENCOUNTER — Telehealth: Payer: Self-pay

## 2022-10-29 NOTE — Telephone Encounter (Signed)
Noted  

## 2022-10-29 NOTE — Telephone Encounter (Signed)
Called patient.  No Vm.     Kate Sable, MD  Armando Reichert, MD; Gerrie Nordmann, NP; Janan Ridge, CMA We can schedule RHC to evaluate pulm htn.  [place] please schedule f/u appt with myself or Sheri to discuss and schedule RHC.  Ty BA       Previous Messages    ----- Message ----- From: Armando Reichert, MD Sent: 10/29/2022  11:47 AM EST To: Gerrie Nordmann, NP; Kate Sable, MD Subject: ? pulm HTN in HIV patient                      Good Day,  Ms. Rappleye was referred to me with a question of shortness of breath. She has a history of HIV and workup has thus far revealed an isolated drop in DLCO (normal high res CT) concerning for pulmonary hypertension in this patient with HIV. I recommend she undergo RHC for further evaluation. I am going to be seeing her soon in clinic soon to discuss all these results but wanted to reach out to you to see what your thoughts on the matter is.  Much appreciated, Achilles Dunk, MD [id number] [facility] 10/29/2022 11:47 AM

## 2022-11-01 NOTE — Telephone Encounter (Signed)
Patient scheduled for 01/31

## 2022-11-03 ENCOUNTER — Telehealth: Payer: Self-pay | Admitting: Student in an Organized Health Care Education/Training Program

## 2022-11-03 ENCOUNTER — Ambulatory Visit: Payer: 59 | Attending: Cardiology | Admitting: Cardiology

## 2022-11-03 ENCOUNTER — Encounter: Payer: Self-pay | Admitting: Cardiology

## 2022-11-03 VITALS — BP 112/80 | HR 75 | Ht 64.0 in | Wt 260.4 lb

## 2022-11-03 DIAGNOSIS — I10 Essential (primary) hypertension: Secondary | ICD-10-CM

## 2022-11-03 DIAGNOSIS — K2100 Gastro-esophageal reflux disease with esophagitis, without bleeding: Secondary | ICD-10-CM

## 2022-11-03 DIAGNOSIS — R0609 Other forms of dyspnea: Secondary | ICD-10-CM | POA: Diagnosis not present

## 2022-11-03 DIAGNOSIS — G4733 Obstructive sleep apnea (adult) (pediatric): Secondary | ICD-10-CM

## 2022-11-03 NOTE — Telephone Encounter (Signed)
I spoke with the patient and let her know the results were just released and we will call her as soon as Dr. Genia Harold has a chance to review them.
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
## 2022-11-03 NOTE — Telephone Encounter (Signed)
HST results have been placed in the patient's chart.

## 2022-11-03 NOTE — Telephone Encounter (Signed)
He had read it. Being released now.

## 2022-11-03 NOTE — Patient Instructions (Signed)
Medication Instructions:   Your physician recommends that you continue on your current medications as directed. Please refer to the Current Medication list given to you today.  *If you need a refill on your cardiac medications before your next appointment, please call your pharmacy*   Lab Work:  None Ordered  If you have labs (blood work) drawn today and your tests are completely normal, you will receive your results only by: MyChart Message (if you have MyChart) OR A paper copy in the mail If you have any lab test that is abnormal or we need to change your treatment, we will call you to review the results.   Testing/Procedures:  None Ordered   Follow-Up: At [facility], you and your health needs are our priority.  As part of our continuing mission to provide you with exceptional heart care, we have created designated Provider Care Teams.  These Care Teams include your primary Cardiologist (physician) and Advanced Practice Providers (APPs -  Physician Assistants and Nurse Practitioners) who all work together to provide you with the care you need, when you need it.  We recommend signing up for the patient portal called "MyChart".  Sign up information is provided on this After Visit Summary.  MyChart is used to connect with patients for Virtual Visits (Telemedicine).  Patients are able to view lab/test results, encounter notes, upcoming appointments, etc.  Non-urgent messages can be sent to your provider as well.   To learn more about what you can do with MyChart, go to [email or web address].    Your next appointment:    AS NEEDED  

## 2022-11-03 NOTE — Telephone Encounter (Signed)
Dr. Annamaria Boots, this patient had a HST on 10/18/2022. Since Dr. Halford Chessman is out of the office can you please read her HST so we can give her the results? Patient has called multiple times for the results. Thank you.

## 2022-11-04 ENCOUNTER — Encounter: Payer: Self-pay | Admitting: Student in an Organized Health Care Education/Training Program

## 2022-11-05 NOTE — Telephone Encounter (Signed)
Order has been updated.  Will close encounter.

## 2022-11-05 NOTE — Telephone Encounter (Signed)
Dr. Genia Harold, please advise on settings? Auto 5-15cm?

## 2022-11-15 NOTE — Telephone Encounter (Signed)
Can you please contact patient and get her First Available with [employer] or [employer]

## 2022-11-19 ENCOUNTER — Other Ambulatory Visit
Admission: RE | Admit: 2022-11-19 | Discharge: 2022-11-19 | Disposition: A | Discharge status: Home / Self Care | Payer: 59 | Attending: Cardiology | Admitting: Cardiology

## 2022-11-19 ENCOUNTER — Encounter: Payer: Self-pay | Admitting: Cardiology

## 2022-11-19 ENCOUNTER — Ambulatory Visit: Payer: 59 | Attending: Cardiology | Admitting: Cardiology

## 2022-11-19 VITALS — BP 130/82 | HR 94 | Ht 64.0 in | Wt 268.0 lb

## 2022-11-19 DIAGNOSIS — Z01812 Encounter for preprocedural laboratory examination: Secondary | ICD-10-CM | POA: Diagnosis present

## 2022-11-19 DIAGNOSIS — I10 Essential (primary) hypertension: Secondary | ICD-10-CM

## 2022-11-19 DIAGNOSIS — R609 Edema, unspecified: Secondary | ICD-10-CM

## 2022-11-19 DIAGNOSIS — R0609 Other forms of dyspnea: Secondary | ICD-10-CM

## 2022-11-19 DIAGNOSIS — E782 Mixed hyperlipidemia: Secondary | ICD-10-CM | POA: Diagnosis not present

## 2022-11-19 DIAGNOSIS — B20 Human immunodeficiency virus [HIV] disease: Secondary | ICD-10-CM

## 2022-11-19 DIAGNOSIS — Z79899 Other long term (current) drug therapy: Secondary | ICD-10-CM | POA: Diagnosis present

## 2022-11-19 DIAGNOSIS — R072 Precordial pain: Secondary | ICD-10-CM | POA: Diagnosis present

## 2022-11-19 LAB — CBC
HCT: 39 % (ref 36.0–46.0)
Hemoglobin: 12.7 g/dL (ref 12.0–15.0)
MCH: 27.1 pg (ref 26.0–34.0)
MCHC: 32.6 g/dL (ref 30.0–36.0)
MCV: 83.2 fL (ref 80.0–100.0)
Platelets: 221 10*3/uL (ref 150–400)
RBC: 4.69 MIL/uL (ref 3.87–5.11)
RDW: 14.1 % (ref 11.5–15.5)
WBC: 4.6 10*3/uL (ref 4.0–10.5)
nRBC: 0 % (ref 0.0–0.2)

## 2022-11-19 LAB — LIPID PANEL
Cholesterol: 176 mg/dL (ref 0–200)
HDL: 106 mg/dL (ref >40)
LDL Cholesterol: 57 mg/dL (ref 0–99)
Total CHOL/HDL Ratio: 1.7 RATIO
Triglycerides: 66 mg/dL (ref <150)
VLDL: 13 mg/dL (ref 0–40)

## 2022-11-19 LAB — HEPATIC FUNCTION PANEL
ALT: 13 U/L (ref 0–44)
AST: 13 U/L — ABNORMAL LOW (ref 15–41)
Albumin: 3.9 g/dL (ref 3.5–5.0)
Alkaline Phosphatase: 53 U/L (ref 38–126)
Bilirubin, Direct: <0.1 mg/dL (ref 0.0–0.2)
Total Bilirubin: 0.9 mg/dL (ref 0.3–1.2)
Total Protein: 7.8 g/dL (ref 6.5–8.1)

## 2022-11-19 LAB — BASIC METABOLIC PANEL
Anion gap: 8 (ref 5–15)
BUN: 15 mg/dL (ref 6–20)
CO2: 25 mmol/L (ref 22–32)
Calcium: 9.4 mg/dL (ref 8.9–10.3)
Chloride: 105 mmol/L (ref 98–111)
Creatinine, Ser: 1.17 mg/dL — ABNORMAL HIGH (ref 0.44–1.00)
GFR, Estimated: 55 mL/min — ABNORMAL LOW (ref >60)
Glucose, Bld: 97 mg/dL (ref 70–99)
Potassium: 4 mmol/L (ref 3.5–5.1)
Sodium: 138 mmol/L (ref 135–145)

## 2022-11-19 MED ORDER — SODIUM CHLORIDE 0.9% FLUSH: 3.0000 mL | Freq: Two times a day (BID) | INTRAVENOUS | Status: DC
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
## 2022-11-19 NOTE — Patient Instructions (Addendum)
Medication Instructions:  No changes at this time.   *If you need a refill on your cardiac medications before your next appointment, please call your pharmacy*   Lab Work: CBC & BMP today over at the [facility]. Stop at registration desk to check in.   If you have labs (blood work) drawn today and your tests are completely normal, you will receive your results only by: [place] (if you have MyChart) OR A paper copy in the mail If you have any lab test that is abnormal or we need to change your treatment, we will call you to review the results.   Testing/Procedures:  [facility] OF [place] [address], [place] [address] Dept: [phone] Loc: [place]  11/19/2022  You are scheduled for a Cardiac Catheterization on Wednesday, February 21 with Dr. Harrell Gave End.  1. Please arrive at the [facility] entrance at 8:30 AM. Free valet parking service is available.   Special note: Every effort is made to have your procedure done on time. Please understand that emergencies sometimes delay scheduled procedures.  2. Diet: Do not eat solid foods after midnight.  The patient may have clear liquids until 5am upon the day of the procedure.  3. Labs: You will need to have blood drawn today over at the [facility] entrance. Please stop at registration desk to check in.   4. Medication instructions in preparation for your procedure:   Contrast Allergy: No   Stop taking, Lasix (Furosemide)  Wednesday, February 21, you may restart this the next day after your procedure.    On the morning of your procedure, take your Aspirin 81 mg and any morning medicines NOT listed above.  You may use sips of water.  5. Plan for one night stay--bring personal belongings. 6. Bring a current list of your medications and current insurance cards. 7. You  MUST have a responsible person to drive you home. 8. Someone MUST be with you the first 24 hours after you arrive home or your discharge will be delayed. 9. Please wear clothes that are easy to get on and off and wear slip-on shoes.  Thank you for allowing [place] to care for you!   -- [facility]    Follow-Up: At [facility], you and your health needs are our priority.  As part of our continuing mission to provide you with exceptional heart care, we have created designated Provider Care Teams.  These Care Teams include your primary Cardiologist (physician) and Advanced Practice Providers (APPs -  Physician Assistants and Nurse Practitioners) who all work together to provide you with the care you need, when you need it.   Your next appointment:   2 week(s) after your procedure.   Provider:   Kate Sable, MD or Gerrie Nordmann, NP
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
## 2022-11-22 NOTE — Progress Notes (Signed)
Pre-procedure labs. LDL with improvement. Make sure to stay hydrated. Recommend repeating BMP 1 week post procedure.

## 2022-11-23 ENCOUNTER — Other Ambulatory Visit: Payer: Self-pay | Admitting: *Deleted

## 2022-11-23 DIAGNOSIS — R0602 Shortness of breath: Secondary | ICD-10-CM

## 2022-11-23 DIAGNOSIS — R072 Precordial pain: Secondary | ICD-10-CM

## 2022-11-23 DIAGNOSIS — R0609 Other forms of dyspnea: Secondary | ICD-10-CM

## 2022-11-23 DIAGNOSIS — E876 Hypokalemia: Secondary | ICD-10-CM

## 2022-11-23 DIAGNOSIS — Z79899 Other long term (current) drug therapy: Secondary | ICD-10-CM

## 2022-11-23 DIAGNOSIS — E782 Mixed hyperlipidemia: Secondary | ICD-10-CM

## 2022-11-24 ENCOUNTER — Other Ambulatory Visit: Payer: Self-pay

## 2022-11-24 ENCOUNTER — Ambulatory Visit
Admission: RE | Admit: 2022-11-24 | Discharge: 2022-11-24 | Disposition: A | Discharge status: Home / Self Care | Payer: 59 | Attending: Internal Medicine | Admitting: Internal Medicine

## 2022-11-24 ENCOUNTER — Encounter: Payer: Self-pay | Admitting: Internal Medicine

## 2022-11-24 ENCOUNTER — Encounter
Admission: RE | Disposition: A | Discharge status: Home / Self Care | Payer: Self-pay | Source: Home / Self Care | Attending: Internal Medicine

## 2022-11-24 DIAGNOSIS — R600 Localized edema: Secondary | ICD-10-CM | POA: Insufficient documentation

## 2022-11-24 DIAGNOSIS — Z6841 Body Mass Index (BMI) 40.0 and over, adult: Secondary | ICD-10-CM | POA: Diagnosis not present

## 2022-11-24 DIAGNOSIS — E782 Mixed hyperlipidemia: Secondary | ICD-10-CM | POA: Diagnosis not present

## 2022-11-24 DIAGNOSIS — I10 Essential (primary) hypertension: Secondary | ICD-10-CM | POA: Diagnosis not present

## 2022-11-24 DIAGNOSIS — Z79899 Other long term (current) drug therapy: Secondary | ICD-10-CM | POA: Insufficient documentation

## 2022-11-24 DIAGNOSIS — R0602 Shortness of breath: Secondary | ICD-10-CM

## 2022-11-24 DIAGNOSIS — R0609 Other forms of dyspnea: Secondary | ICD-10-CM

## 2022-11-24 DIAGNOSIS — R5383 Other fatigue: Secondary | ICD-10-CM | POA: Diagnosis not present

## 2022-11-24 DIAGNOSIS — Z21 Asymptomatic human immunodeficiency virus [HIV] infection status: Secondary | ICD-10-CM | POA: Diagnosis not present

## 2022-11-24 HISTORY — PX: RIGHT HEART CATH: CATH118263

## 2022-11-24 LAB — POCT I-STAT EG7
Acid-Base Excess: 0 mmol/L (ref 0.0–2.0)
Bicarbonate: 25.8 mmol/L (ref 20.0–28.0)
Calcium, Ion: 1.28 mmol/L (ref 1.15–1.40)
HCT: 36 % (ref 36.0–46.0)
Hemoglobin: 12.2 g/dL (ref 12.0–15.0)
O2 Saturation: 67 %
Potassium: 4 mmol/L (ref 3.5–5.1)
Sodium: 141 mmol/L (ref 135–145)
TCO2: 27 mmol/L (ref 22–32)
pCO2, Ven: 44.2 mmHg (ref 44–60)
pH, Ven: 7.375 (ref 7.25–7.43)
pO2, Ven: 36 mmHg (ref 32–45)

## 2022-11-24 SURGERY — RIGHT HEART CATH
Anesthesia: Moderate Sedation | Laterality: Right

## 2022-11-24 MED ORDER — ACETAMINOPHEN 325 MG PO TABS
ORAL_TABLET | ORAL | Status: AC
  Filled 2022-11-24: qty 2

## 2022-11-24 MED ORDER — SODIUM CHLORIDE 0.9% FLUSH: 3.0000 mL | INTRAVENOUS | Status: DC | PRN

## 2022-11-24 MED ORDER — SODIUM CHLORIDE 0.9 % IV SOLN: 250.0000 mL | INTRAVENOUS | Status: DC | PRN

## 2022-11-24 MED ORDER — MIDAZOLAM HCL 2 MG/2ML IJ SOLN
INTRAMUSCULAR | Status: AC
  Filled 2022-11-24: qty 2

## 2022-11-24 MED ORDER — FUROSEMIDE 40 MG PO TABS: 40.0000 mg | ORAL_TABLET | Freq: Every day | ORAL | 3 refills | Status: DC

## 2022-11-24 MED ORDER — SODIUM CHLORIDE 0.9 % WEIGHT BASED INFUSION: 1.0000 mL/kg/h | INTRAVENOUS | Status: DC

## 2022-11-24 MED ORDER — SODIUM CHLORIDE 0.9% FLUSH: 3.0000 mL | Freq: Two times a day (BID) | INTRAVENOUS | Status: DC

## 2022-11-24 MED ORDER — HYDRALAZINE HCL 20 MG/ML IJ SOLN: 10.0000 mg | INTRAMUSCULAR | Status: DC | PRN

## 2022-11-24 MED ORDER — ONDANSETRON HCL 4 MG/2ML IJ SOLN: 4.0000 mg | Freq: Four times a day (QID) | INTRAMUSCULAR | Status: DC | PRN

## 2022-11-24 MED ORDER — HEPARIN (PORCINE) IN NACL 1000-0.9 UT/500ML-% IV SOLN
INTRAVENOUS | Status: AC
  Filled 2022-11-24: qty 1000

## 2022-11-24 MED ORDER — LABETALOL HCL 5 MG/ML IV SOLN: 10.0000 mg | INTRAVENOUS | Status: DC | PRN

## 2022-11-24 MED ORDER — ACETAMINOPHEN 325 MG PO TABS
650.0000 mg | ORAL_TABLET | ORAL | Status: DC | PRN
  Administered 2022-11-24: 650 mg via ORAL

## 2022-11-24 MED ORDER — SODIUM CHLORIDE 0.9 % WEIGHT BASED INFUSION: 3.0000 mL/kg/h | INTRAVENOUS | Status: AC

## 2022-11-24 MED ORDER — FENTANYL CITRATE (PF) 100 MCG/2ML IJ SOLN
INTRAMUSCULAR | Status: AC
  Filled 2022-11-24: qty 2

## 2022-11-24 MED ORDER — HEPARIN (PORCINE) IN NACL 1000-0.9 UT/500ML-% IV SOLN
INTRAVENOUS | Status: DC | PRN
  Administered 2022-11-24 (×2): 500 mL

## 2022-11-24 SURGICAL SUPPLY — 10 items
CATH BALLN WEDGE 5F 110CM (CATHETERS) IMPLANT
DRAPE BRACHIAL (DRAPES) IMPLANT
GUIDEWIRE .025 260CM (WIRE) IMPLANT
KIT RIGHT HEART ACIST (MISCELLANEOUS) IMPLANT
KIT SYRINGE INJ CVI SPIKEX1 (MISCELLANEOUS) IMPLANT
PACK CARDIAC CATH (CUSTOM PROCEDURE TRAY) ×1 IMPLANT
PROTECTION STATION PRESSURIZED (MISCELLANEOUS) ×1
SET ATX SIMPLICITY (MISCELLANEOUS) IMPLANT
SHEATH GLIDE SLENDER 4/5FR (SHEATH) IMPLANT
STATION PROTECTION PRESSURIZED (MISCELLANEOUS) IMPLANT

## 2022-11-24 NOTE — Discharge Instructions (Signed)
Right Heart Cath, Care After This sheet gives you information about how to care for yourself after your procedure. Your health care provider may also give you more specific instructions. If you have problems or questions, contact your health care provider. What can I expect after the procedure? After the procedure, it is common to have: Bruising or mild discomfort in the area where the IV was inserted (insertion site). Follow these instructions at home: Eating and drinking  You may eat and drink after your procedure.  Drink a lot of fluids for the first several days after the procedure, as directed by your health care provider. This helps to wash (flush) the contrast out of your body. Examples of healthy fluids include water or low-calorie drinks. General instructions Check your IV insertion area and also your venous access site every day for signs of infection. Check for: Redness, swelling, or pain. Fluid or blood. Warmth. Pus or a bad smell. Take over-the-counter and prescription medicines only as told by your health care provider. Rest and return to your normal activities as told by your health care provider. Ask your health care provider what activities are safe for you. Do not drive for 24 hours if you were given a medicine to help you relax (sedative), or until your health care provider approves. Keep all follow-up visits as told by your health care provider. This is important. Contact a health care provider if: Your skin becomes itchy or you develop a rash or hives. You have a fever that does not get better with medicine. You feel nauseous. You vomit. You have redness, swelling, or pain around the insertion site. You have fluid or blood coming from the insertion site. Your insertion area feels warm to the touch. You have pus or a bad smell coming from the insertion site. Get help right away if: You have difficulty breathing or shortness of breath. You develop chest pain. You  faint. You feel very dizzy. These symptoms may represent a serious problem that is an emergency. Do not wait to see if the symptoms will go away. Get medical help right away. Call your local emergency services (911 in the [place] Do not drive yourself to the hospital. Summary After your procedure, it is common to have bruising or mild discomfort in the area where the IV was inserted. You should check your IV insertion area every day for signs of infection. Take over-the-counter and prescription medicines only as told by your health care provider. You should drink a lot of fluids for the first several days after the procedure to help flush the contrast from your body. This information is not intended to replace advice given to you by your health care provider. Make sure you discuss any questions you have with your health care provider. Document Released: 07/11/2013 Document Revised: 09/02/2017 Document Reviewed: 08/14/2016 Elsevier Patient Education  2020 [employer] 

## 2022-11-24 NOTE — Interval H&P Note (Signed)
History and Physical Interval Note:  11/24/2022 9:04 AM  Alicia Lynch  has presented today for surgery, with the diagnosis of shortness of breath.  The various methods of treatment have been discussed with the patient and family. After consideration of risks, benefits and other options for treatment, the patient has consented to  Procedure(s): RIGHT HEART CATH (Right) as a surgical intervention.  The patient's history has been reviewed, patient examined, no change in status, stable for surgery.  I have reviewed the patient's chart and labs.  Questions were answered to the patient's satisfaction.     Domingos Riggi

## 2022-11-25 ENCOUNTER — Encounter: Payer: Self-pay | Admitting: Internal Medicine

## 2022-11-30 ENCOUNTER — Ambulatory Visit (INDEPENDENT_AMBULATORY_CARE_PROVIDER_SITE_OTHER): Payer: 59 | Admitting: Student in an Organized Health Care Education/Training Program

## 2022-11-30 ENCOUNTER — Encounter: Payer: Self-pay | Admitting: Student in an Organized Health Care Education/Training Program

## 2022-11-30 VITALS — BP 138/86 | HR 81 | Temp 97.9°F | Ht 64.0 in | Wt 271.8 lb

## 2022-11-30 DIAGNOSIS — G4733 Obstructive sleep apnea (adult) (pediatric): Secondary | ICD-10-CM | POA: Diagnosis not present

## 2022-11-30 DIAGNOSIS — R0602 Shortness of breath: Secondary | ICD-10-CM

## 2022-11-30 DIAGNOSIS — I5032 Chronic diastolic (congestive) heart failure: Secondary | ICD-10-CM | POA: Diagnosis not present
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
## 2022-12-06 ENCOUNTER — Other Ambulatory Visit (HOSPITAL_COMMUNITY): Payer: Self-pay

## 2022-12-06 ENCOUNTER — Ambulatory Visit: Payer: 59 | Attending: Cardiology | Admitting: Cardiology

## 2022-12-06 ENCOUNTER — Encounter: Payer: Self-pay | Admitting: Cardiology

## 2022-12-14 ENCOUNTER — Ambulatory Visit: Payer: 59 | Attending: Infectious Diseases | Admitting: Infectious Diseases

## 2022-12-14 ENCOUNTER — Encounter: Payer: Self-pay | Admitting: Infectious Diseases

## 2022-12-14 VITALS — BP 121/87 | HR 84 | Temp 97.3°F | Wt 275.0 lb

## 2022-12-14 DIAGNOSIS — J4489 Other specified chronic obstructive pulmonary disease: Secondary | ICD-10-CM | POA: Insufficient documentation

## 2022-12-14 DIAGNOSIS — E1169 Type 2 diabetes mellitus with other specified complication: Secondary | ICD-10-CM | POA: Diagnosis not present

## 2022-12-14 DIAGNOSIS — M48061 Spinal stenosis, lumbar region without neurogenic claudication: Secondary | ICD-10-CM | POA: Insufficient documentation

## 2022-12-14 DIAGNOSIS — F32A Depression, unspecified: Secondary | ICD-10-CM | POA: Diagnosis not present

## 2022-12-14 DIAGNOSIS — I428 Other cardiomyopathies: Secondary | ICD-10-CM | POA: Insufficient documentation

## 2022-12-14 DIAGNOSIS — B20 Human immunodeficiency virus [HIV] disease: Secondary | ICD-10-CM | POA: Diagnosis not present

## 2022-12-14 DIAGNOSIS — F419 Anxiety disorder, unspecified: Secondary | ICD-10-CM | POA: Diagnosis not present

## 2022-12-14 DIAGNOSIS — I10 Essential (primary) hypertension: Secondary | ICD-10-CM | POA: Diagnosis not present

## 2022-12-14 DIAGNOSIS — Z79899 Other long term (current) drug therapy: Secondary | ICD-10-CM | POA: Insufficient documentation

## 2022-12-14 DIAGNOSIS — G8929 Other chronic pain: Secondary | ICD-10-CM | POA: Diagnosis not present

## 2022-12-14 DIAGNOSIS — Z794 Long term (current) use of insulin: Secondary | ICD-10-CM | POA: Insufficient documentation

## 2022-12-14 MED ORDER — CABOTEGRAVIR & RILPIVIRINE ER 600 & 900 MG/3ML IM SUER
1.0000 | Freq: Once | INTRAMUSCULAR | Status: AC
  Administered 2022-12-14: 1 via INTRAMUSCULAR
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
## 2022-12-21 ENCOUNTER — Telehealth: Payer: Self-pay | Admitting: Student in an Organized Health Care Education/Training Program

## 2022-12-21 NOTE — Telephone Encounter (Signed)
[employer] has faxed over request for office notes etc for CPAP recertification

## 2022-12-21 NOTE — Telephone Encounter (Signed)
Form has been received and placed in Dr. Gretta Began folder for signature.

## 2022-12-22 ENCOUNTER — Encounter: Payer: Self-pay | Admitting: *Deleted

## 2022-12-23 NOTE — Telephone Encounter (Signed)
Form has not been signed by MD, as he is currently working on ICU.  Will fax form back once signed.

## 2022-12-23 NOTE — Telephone Encounter (Signed)
[employer] calling on this form/signature. Read this msg from [facility] back to them.

## 2022-12-24 NOTE — Telephone Encounter (Signed)
Forms completed and faxed to [employer].

## 2023-01-24 ENCOUNTER — Ambulatory Visit: Payer: 59 | Admitting: Cardiology

## 2023-01-27 ENCOUNTER — Other Ambulatory Visit (HOSPITAL_COMMUNITY): Payer: Self-pay

## 2023-01-28 ENCOUNTER — Other Ambulatory Visit (HOSPITAL_COMMUNITY): Payer: Self-pay

## 2023-01-31 ENCOUNTER — Other Ambulatory Visit: Payer: Self-pay

## 2023-02-01 ENCOUNTER — Other Ambulatory Visit (HOSPITAL_COMMUNITY): Payer: Self-pay

## 2023-02-08 ENCOUNTER — Other Ambulatory Visit: Payer: Self-pay | Admitting: Internal Medicine

## 2023-02-10 ENCOUNTER — Ambulatory Visit: Payer: 59 | Attending: Infectious Diseases

## 2023-02-10 ENCOUNTER — Other Ambulatory Visit
Admission: RE | Admit: 2023-02-10 | Discharge: 2023-02-10 | Disposition: A | Discharge status: Home / Self Care | Payer: Medicare HMO | Source: Ambulatory Visit | Attending: Infectious Diseases | Admitting: Infectious Diseases

## 2023-02-10 ENCOUNTER — Other Ambulatory Visit: Payer: Self-pay

## 2023-02-10 ENCOUNTER — Ambulatory Visit: Payer: 59 | Admitting: Infectious Diseases

## 2023-02-10 VITALS — BP 125/89 | HR 85 | Ht 65.0 in | Wt 275.0 lb

## 2023-02-10 DIAGNOSIS — B20 Human immunodeficiency virus [HIV] disease: Secondary | ICD-10-CM

## 2023-02-10 DIAGNOSIS — Z113 Encounter for screening for infections with a predominantly sexual mode of transmission: Secondary | ICD-10-CM

## 2023-02-10 LAB — CBC WITH DIFFERENTIAL/PLATELET
Abs Immature Granulocytes: 0.02 10*3/uL (ref 0.00–0.07)
Basophils Absolute: 0 10*3/uL (ref 0.0–0.1)
Basophils Relative: 1 %
Eosinophils Absolute: 0.1 10*3/uL (ref 0.0–0.5)
Eosinophils Relative: 2 %
HCT: 35.6 % — ABNORMAL LOW (ref 36.0–46.0)
Hemoglobin: 11.4 g/dL — ABNORMAL LOW (ref 12.0–15.0)
Immature Granulocytes: 1 %
Lymphocytes Relative: 47 %
Lymphs Abs: 1.7 10*3/uL (ref 0.7–4.0)
MCH: 27.5 pg (ref 26.0–34.0)
MCHC: 32 g/dL (ref 30.0–36.0)
MCV: 85.8 fL (ref 80.0–100.0)
Monocytes Absolute: 0.3 10*3/uL (ref 0.1–1.0)
Monocytes Relative: 10 %
Neutro Abs: 1.3 10*3/uL — ABNORMAL LOW (ref 1.7–7.7)
Neutrophils Relative %: 39 %
Platelets: 210 10*3/uL (ref 150–400)
RBC: 4.15 MIL/uL (ref 3.87–5.11)
RDW: 13.5 % (ref 11.5–15.5)
WBC: 3.4 10*3/uL — ABNORMAL LOW (ref 4.0–10.5)
nRBC: 0 % (ref 0.0–0.2)

## 2023-02-10 LAB — COMPREHENSIVE METABOLIC PANEL
ALT: 10 U/L (ref 0–44)
AST: 13 U/L — ABNORMAL LOW (ref 15–41)
Albumin: 4 g/dL (ref 3.5–5.0)
Alkaline Phosphatase: 61 U/L (ref 38–126)
Anion gap: 8 (ref 5–15)
BUN: 17 mg/dL (ref 6–20)
CO2: 28 mmol/L (ref 22–32)
Calcium: 9.3 mg/dL (ref 8.9–10.3)
Chloride: 105 mmol/L (ref 98–111)
Creatinine, Ser: 1.14 mg/dL — ABNORMAL HIGH (ref 0.44–1.00)
GFR, Estimated: 57 mL/min — ABNORMAL LOW (ref >60)
Glucose, Bld: 101 mg/dL — ABNORMAL HIGH (ref 70–99)
Potassium: 3.6 mmol/L (ref 3.5–5.1)
Sodium: 141 mmol/L (ref 135–145)
Total Bilirubin: 0.5 mg/dL (ref 0.3–1.2)
Total Protein: 7.9 g/dL (ref 6.5–8.1)

## 2023-02-10 LAB — RPR: RPR Ser Ql: NONREACTIVE

## 2023-02-10 MED ORDER — CABOTEGRAVIR & RILPIVIRINE ER 600 & 900 MG/3ML IM SUER
1.0000 | Freq: Once | INTRAMUSCULAR | Status: AC
  Administered 2023-02-10: 1 via INTRAMUSCULAR

## 2023-02-11 LAB — HIV-1 RNA QUANT-NO REFLEX-BLD
HIV 1 RNA Quant: 50 copies/mL
LOG10 HIV-1 RNA: 1.699 log10copy/mL

## 2023-02-11 LAB — T-HELPER CELL (CD4) - (RCID CLINIC ONLY)
CD4 % Helper T Cell: 31 % — ABNORMAL LOW (ref 33–65)
CD4 T Cell Abs: 437 /uL (ref 400–1790)

## 2023-02-11 NOTE — Progress Notes (Addendum)
Patient in office for Cabenuva injection. Patient tolerated well and no question or concerns Alicia Lynch [name]

## 2023-03-07 ENCOUNTER — Other Ambulatory Visit: Payer: Self-pay | Admitting: Internal Medicine

## 2023-03-07 ENCOUNTER — Ambulatory Visit
Admission: RE | Admit: 2023-03-07 | Discharge: 2023-03-07 | Disposition: A | Discharge status: Home / Self Care | Payer: Medicare HMO | Source: Ambulatory Visit | Attending: Internal Medicine | Admitting: Internal Medicine

## 2023-03-07 DIAGNOSIS — M25512 Pain in left shoulder: Secondary | ICD-10-CM

## 2023-03-07 DIAGNOSIS — M25412 Effusion, left shoulder: Secondary | ICD-10-CM

## 2023-03-08 ENCOUNTER — Ambulatory Visit (INDEPENDENT_AMBULATORY_CARE_PROVIDER_SITE_OTHER): Payer: Medicare HMO | Admitting: Student in an Organized Health Care Education/Training Program

## 2023-03-08 ENCOUNTER — Encounter: Payer: Self-pay | Admitting: Student in an Organized Health Care Education/Training Program

## 2023-03-08 VITALS — BP 134/78 | HR 81 | Temp 98.0°F | Ht 65.0 in | Wt 282.8 lb

## 2023-03-08 DIAGNOSIS — G4733 Obstructive sleep apnea (adult) (pediatric): Secondary | ICD-10-CM

## 2023-03-08 DIAGNOSIS — R0602 Shortness of breath: Secondary | ICD-10-CM

## 2023-03-08 DIAGNOSIS — I5032 Chronic diastolic (congestive) heart failure: Secondary | ICD-10-CM | POA: Diagnosis not present
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
## 2023-03-31 ENCOUNTER — Other Ambulatory Visit (HOSPITAL_COMMUNITY): Payer: Self-pay

## 2023-04-01 ENCOUNTER — Other Ambulatory Visit (HOSPITAL_COMMUNITY): Payer: Self-pay

## 2023-04-04 ENCOUNTER — Other Ambulatory Visit: Payer: Self-pay

## 2023-04-12 ENCOUNTER — Ambulatory Visit: Payer: Medicare HMO | Attending: Infectious Diseases | Admitting: Infectious Diseases

## 2023-04-12 ENCOUNTER — Encounter: Payer: Self-pay | Admitting: Infectious Diseases

## 2023-04-12 VITALS — BP 130/95 | HR 82 | Temp 97.4°F | Ht 64.0 in | Wt 284.0 lb

## 2023-04-12 DIAGNOSIS — I428 Other cardiomyopathies: Secondary | ICD-10-CM | POA: Insufficient documentation

## 2023-04-12 DIAGNOSIS — Z794 Long term (current) use of insulin: Secondary | ICD-10-CM | POA: Insufficient documentation

## 2023-04-12 DIAGNOSIS — Z79899 Other long term (current) drug therapy: Secondary | ICD-10-CM | POA: Insufficient documentation

## 2023-04-12 DIAGNOSIS — I10 Essential (primary) hypertension: Secondary | ICD-10-CM | POA: Diagnosis not present

## 2023-04-12 DIAGNOSIS — E1169 Type 2 diabetes mellitus with other specified complication: Secondary | ICD-10-CM | POA: Diagnosis not present

## 2023-04-12 DIAGNOSIS — F419 Anxiety disorder, unspecified: Secondary | ICD-10-CM | POA: Insufficient documentation

## 2023-04-12 DIAGNOSIS — J4489 Other specified chronic obstructive pulmonary disease: Secondary | ICD-10-CM | POA: Diagnosis not present

## 2023-04-12 DIAGNOSIS — G8929 Other chronic pain: Secondary | ICD-10-CM | POA: Diagnosis not present

## 2023-04-12 DIAGNOSIS — B20 Human immunodeficiency virus [HIV] disease: Secondary | ICD-10-CM | POA: Diagnosis not present

## 2023-04-12 DIAGNOSIS — Z96612 Presence of left artificial shoulder joint: Secondary | ICD-10-CM | POA: Insufficient documentation

## 2023-04-12 DIAGNOSIS — F32A Depression, unspecified: Secondary | ICD-10-CM | POA: Insufficient documentation

## 2023-04-12 DIAGNOSIS — M48061 Spinal stenosis, lumbar region without neurogenic claudication: Secondary | ICD-10-CM | POA: Insufficient documentation

## 2023-04-12 MED ORDER — CABOTEGRAVIR & RILPIVIRINE ER 600 & 900 MG/3ML IM SUER
1.0000 | Freq: Once | INTRAMUSCULAR | Status: AC
  Administered 2023-04-12: 1 via INTRAMUSCULAR
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
## 2023-05-27 ENCOUNTER — Other Ambulatory Visit: Payer: Self-pay

## 2023-05-27 ENCOUNTER — Ambulatory Visit
Admission: RE | Admit: 2023-05-27 | Discharge: 2023-05-27 | Disposition: A | Discharge status: Home / Self Care | Payer: 59 | Source: Ambulatory Visit | Attending: Internal Medicine | Admitting: Internal Medicine

## 2023-05-27 ENCOUNTER — Other Ambulatory Visit (HOSPITAL_COMMUNITY): Payer: Self-pay

## 2023-05-27 ENCOUNTER — Other Ambulatory Visit: Payer: Self-pay | Admitting: Pharmacist

## 2023-05-27 ENCOUNTER — Other Ambulatory Visit: Payer: Self-pay | Admitting: Internal Medicine

## 2023-05-27 DIAGNOSIS — M25551 Pain in right hip: Secondary | ICD-10-CM

## 2023-05-27 DIAGNOSIS — B20 Human immunodeficiency virus [HIV] disease: Secondary | ICD-10-CM

## 2023-05-27 MED ORDER — CABOTEGRAVIR & RILPIVIRINE ER 600 & 900 MG/3ML IM SUER
1.0000 | INTRAMUSCULAR | 5 refills | Status: DC
  Filled 2023-05-27 – 2023-05-30 (×2): qty 6, 60d supply, fill #0
  Filled 2023-07-27: qty 6, 60d supply, fill #1
  Filled 2023-09-20: qty 6, 60d supply, fill #2
  Filled 2023-12-06: qty 6, 60d supply, fill #3
  Filled 2024-01-30: qty 6, 60d supply, fill #4
  Filled 2024-04-05: qty 6, 60d supply, fill #5

## 2023-05-30 ENCOUNTER — Other Ambulatory Visit: Payer: Self-pay

## 2023-05-30 ENCOUNTER — Other Ambulatory Visit (HOSPITAL_COMMUNITY): Payer: Self-pay

## 2023-06-03 ENCOUNTER — Other Ambulatory Visit (HOSPITAL_COMMUNITY): Payer: Self-pay

## 2023-06-14 ENCOUNTER — Other Ambulatory Visit: Payer: Self-pay

## 2023-06-14 ENCOUNTER — Ambulatory Visit: Payer: 59 | Attending: Infectious Diseases | Admitting: Infectious Diseases

## 2023-06-14 DIAGNOSIS — B20 Human immunodeficiency virus [HIV] disease: Secondary | ICD-10-CM | POA: Diagnosis not present

## 2023-06-14 DIAGNOSIS — Z113 Encounter for screening for infections with a predominantly sexual mode of transmission: Secondary | ICD-10-CM

## 2023-06-14 MED ORDER — CABOTEGRAVIR & RILPIVIRINE ER 600 & 900 MG/3ML IM SUER
1.0000 | Freq: Once | INTRAMUSCULAR | Status: AC
Start: 2023-06-14 — End: 2023-06-14
  Administered 2023-06-14: 1 via INTRAMUSCULAR

## 2023-06-16 ENCOUNTER — Other Ambulatory Visit
Admission: RE | Admit: 2023-06-16 | Discharge: 2023-06-16 | Disposition: A | Discharge status: Home / Self Care | Payer: 59 | Source: Ambulatory Visit | Attending: Infectious Diseases | Admitting: Infectious Diseases

## 2023-06-16 DIAGNOSIS — B20 Human immunodeficiency virus [HIV] disease: Secondary | ICD-10-CM | POA: Insufficient documentation

## 2023-06-16 DIAGNOSIS — Z113 Encounter for screening for infections with a predominantly sexual mode of transmission: Secondary | ICD-10-CM | POA: Diagnosis present

## 2023-06-16 LAB — CBC WITH DIFFERENTIAL/PLATELET
Abs Immature Granulocytes: 0.01 10*3/uL (ref 0.00–0.07)
Basophils Absolute: 0 10*3/uL (ref 0.0–0.1)
Basophils Relative: 1 %
Eosinophils Absolute: 0.1 10*3/uL (ref 0.0–0.5)
Eosinophils Relative: 4 %
HCT: 36.7 % (ref 36.0–46.0)
Hemoglobin: 11.9 g/dL — ABNORMAL LOW (ref 12.0–15.0)
Immature Granulocytes: 0 %
Lymphocytes Relative: 48 %
Lymphs Abs: 1.6 10*3/uL (ref 0.7–4.0)
MCH: 26.8 pg (ref 26.0–34.0)
MCHC: 32.4 g/dL (ref 30.0–36.0)
MCV: 82.7 fL (ref 80.0–100.0)
Monocytes Absolute: 0.3 10*3/uL (ref 0.1–1.0)
Monocytes Relative: 9 %
Neutro Abs: 1.3 10*3/uL — ABNORMAL LOW (ref 1.7–7.7)
Neutrophils Relative %: 38 %
Platelets: 174 10*3/uL (ref 150–400)
RBC: 4.44 MIL/uL (ref 3.87–5.11)
RDW: 14.2 % (ref 11.5–15.5)
WBC: 3.4 10*3/uL — ABNORMAL LOW (ref 4.0–10.5)
nRBC: 0 % (ref 0.0–0.2)

## 2023-06-17 LAB — RPR: RPR Ser Ql: NONREACTIVE

## 2023-06-18 LAB — HIV-1 RNA QUANT-NO REFLEX-BLD
HIV 1 RNA Quant: 40 {copies}/mL
LOG10 HIV-1 RNA: 1.602 {Log_copies}/mL

## 2023-07-05 NOTE — Progress Notes (Signed)
Pt here to receive cabotegravir/rilpavarine injection for HIV

## 2023-07-27 ENCOUNTER — Other Ambulatory Visit: Payer: Self-pay

## 2023-07-27 ENCOUNTER — Other Ambulatory Visit (HOSPITAL_COMMUNITY): Payer: Self-pay

## 2023-07-27 NOTE — Progress Notes (Signed)
Specialty Pharmacy Refill Coordination Note  Alicia Lynch is a 57 y.o. female assessed today regarding refills of clinic administered specialty medication(s) Cabotegravir & Rilpivirine   Clinic requested Courier to Provider Office   Delivery date: 08/04/23   Verified address: [facility] [address] [place] [id number]   Medication will be filled on 08/03/23.

## 2023-08-03 ENCOUNTER — Other Ambulatory Visit (HOSPITAL_COMMUNITY): Payer: Self-pay

## 2023-08-11 ENCOUNTER — Encounter: Payer: Self-pay | Admitting: Infectious Diseases

## 2023-08-11 ENCOUNTER — Other Ambulatory Visit: Payer: Self-pay

## 2023-08-11 ENCOUNTER — Ambulatory Visit: Payer: 59 | Attending: Infectious Diseases | Admitting: Infectious Diseases

## 2023-08-11 VITALS — BP 110/76 | HR 105 | Temp 96.4°F | Ht 64.0 in | Wt 276.0 lb

## 2023-08-11 DIAGNOSIS — G8929 Other chronic pain: Secondary | ICD-10-CM | POA: Insufficient documentation

## 2023-08-11 DIAGNOSIS — M48061 Spinal stenosis, lumbar region without neurogenic claudication: Secondary | ICD-10-CM | POA: Insufficient documentation

## 2023-08-11 DIAGNOSIS — Z5971 Insufficient health insurance coverage: Secondary | ICD-10-CM | POA: Insufficient documentation

## 2023-08-11 DIAGNOSIS — E1169 Type 2 diabetes mellitus with other specified complication: Secondary | ICD-10-CM | POA: Insufficient documentation

## 2023-08-11 DIAGNOSIS — Z9049 Acquired absence of other specified parts of digestive tract: Secondary | ICD-10-CM | POA: Insufficient documentation

## 2023-08-11 DIAGNOSIS — Z96612 Presence of left artificial shoulder joint: Secondary | ICD-10-CM | POA: Diagnosis not present

## 2023-08-11 DIAGNOSIS — G4730 Sleep apnea, unspecified: Secondary | ICD-10-CM | POA: Insufficient documentation

## 2023-08-11 DIAGNOSIS — B20 Human immunodeficiency virus [HIV] disease: Secondary | ICD-10-CM | POA: Diagnosis present

## 2023-08-11 DIAGNOSIS — R609 Edema, unspecified: Secondary | ICD-10-CM | POA: Diagnosis not present

## 2023-08-11 MED ORDER — CABOTEGRAVIR & RILPIVIRINE ER 600 & 900 MG/3ML IM SUER
1.0000 | Freq: Once | INTRAMUSCULAR | Status: AC
Start: 2023-08-11 — End: 2023-08-11
  Administered 2023-08-11: 1 via INTRAMUSCULAR
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
## 2023-08-11 NOTE — Addendum Note (Signed)
Addended by: Tressa Busman T on: 08/11/2023 09:53 AM   Modules accepted: Orders

## 2023-08-11 NOTE — Patient Instructions (Signed)
  You had a routine follow-up appointment today to review your HIV treatment and other health concerns. You reported no side effects from your HIV medication and expressed relief at receiving the injections. Your last lab results showed a T cell count of 437 and a viral load of 40. You also discussed your sleep apnea, weight management, and recent visits to your pulmonologist and cardiologist.  YOUR PLAN:  -HIV: HIV is a virus that attacks the body's immune system. Your condition is stable on injectable therapy with no reported side effects. Your last T cell count was 437 and your viral load was 40 in September 2024. You should continue your current therapy, and we will plan for labs at your next visit in January.  -DIABETES: Diabetes is a condition that affects how your body processes blood sugar. There are currently issues with your insurance regarding Ozempic therapy. We will communicate with Dr. Lynett Grimes to resolve the status of your Ozempic therapy.  -SLEEP APNEA: Sleep apnea is a sleep disorder where breathing repeatedly stops and starts. You are using your CPAP machine nightly, and you should continue with this therapy.  -WEIGHT MANAGEMENT: You expressed interest in weight loss medication. We will communicate with Dr. Lynett Grimes regarding potential weight loss medication options for you.  -CARDIOPULMONARY: Your heart and lung health are stable with no new complaints. You were last seen by your pulmonologist in June and your cardiologist in February. You should continue with your current management.  INSTRUCTIONS:  Please schedule your next visit in January, which will include complete blood work.

## 2023-08-15 ENCOUNTER — Other Ambulatory Visit (HOSPITAL_COMMUNITY): Payer: Self-pay

## 2023-08-18 ENCOUNTER — Ambulatory Visit: Payer: 59 | Admitting: Infectious Diseases

## 2023-09-18 ENCOUNTER — Other Ambulatory Visit: Payer: Self-pay | Admitting: Student in an Organized Health Care Education/Training Program

## 2023-09-18 DIAGNOSIS — R0602 Shortness of breath: Secondary | ICD-10-CM

## 2023-09-20 ENCOUNTER — Other Ambulatory Visit (HOSPITAL_COMMUNITY): Payer: Self-pay

## 2023-09-20 ENCOUNTER — Other Ambulatory Visit: Payer: Self-pay

## 2023-09-20 NOTE — Progress Notes (Signed)
Specialty Pharmacy Refill Coordination Note  Alicia Lynch is a 57 y.o. female assessed today regarding refills of clinic administered specialty medication(s) Cabotegravir & Rilpivirine [facility])   Clinic requested Courier to Provider Office   Delivery date: 10/06/23   Verified address: [facility] [address] [place] [address]   Medication will be filled on 10/04/23.

## 2023-10-04 ENCOUNTER — Other Ambulatory Visit: Payer: Self-pay

## 2023-10-07 ENCOUNTER — Other Ambulatory Visit: Payer: Self-pay | Admitting: Internal Medicine

## 2023-10-10 ENCOUNTER — Other Ambulatory Visit: Payer: Self-pay | Admitting: Cardiology

## 2023-10-10 NOTE — Telephone Encounter (Signed)
 In reviewing the patient's chart, she has a history of GERD. Would have her PCP refill this medication.   Thanks!

## 2023-10-10 NOTE — Telephone Encounter (Signed)
 Hi,  Please advise if ok to refill medication or defer to PCP. Patient was last seen by S. Hammock on 11/19/2022. Patient was to follow up 2 weeks after her cardiac cath, but she no showed her appointment. Thank you so much.

## 2023-10-13 ENCOUNTER — Encounter: Payer: Self-pay | Admitting: Infectious Diseases

## 2023-10-13 ENCOUNTER — Other Ambulatory Visit
Admission: RE | Admit: 2023-10-13 | Discharge: 2023-10-13 | Disposition: A | Discharge status: Home / Self Care | Payer: 59 | Source: Ambulatory Visit | Attending: Infectious Diseases | Admitting: Infectious Diseases

## 2023-10-13 ENCOUNTER — Ambulatory Visit: Payer: 59 | Attending: Infectious Diseases | Admitting: Infectious Diseases

## 2023-10-13 VITALS — BP 123/84 | HR 80 | Ht 64.0 in | Wt 281.0 lb

## 2023-10-13 DIAGNOSIS — F32A Depression, unspecified: Secondary | ICD-10-CM | POA: Insufficient documentation

## 2023-10-13 DIAGNOSIS — Z79899 Other long term (current) drug therapy: Secondary | ICD-10-CM | POA: Diagnosis present

## 2023-10-13 DIAGNOSIS — Z96612 Presence of left artificial shoulder joint: Secondary | ICD-10-CM | POA: Insufficient documentation

## 2023-10-13 DIAGNOSIS — G8929 Other chronic pain: Secondary | ICD-10-CM | POA: Diagnosis not present

## 2023-10-13 DIAGNOSIS — Z9049 Acquired absence of other specified parts of digestive tract: Secondary | ICD-10-CM | POA: Insufficient documentation

## 2023-10-13 DIAGNOSIS — J4489 Other specified chronic obstructive pulmonary disease: Secondary | ICD-10-CM | POA: Diagnosis not present

## 2023-10-13 DIAGNOSIS — M48061 Spinal stenosis, lumbar region without neurogenic claudication: Secondary | ICD-10-CM | POA: Diagnosis not present

## 2023-10-13 DIAGNOSIS — G4730 Sleep apnea, unspecified: Secondary | ICD-10-CM | POA: Insufficient documentation

## 2023-10-13 DIAGNOSIS — Z21 Asymptomatic human immunodeficiency virus [HIV] infection status: Secondary | ICD-10-CM | POA: Diagnosis present

## 2023-10-13 DIAGNOSIS — I10 Essential (primary) hypertension: Secondary | ICD-10-CM | POA: Diagnosis not present

## 2023-10-13 DIAGNOSIS — R609 Edema, unspecified: Secondary | ICD-10-CM | POA: Insufficient documentation

## 2023-10-13 DIAGNOSIS — E1169 Type 2 diabetes mellitus with other specified complication: Secondary | ICD-10-CM | POA: Diagnosis not present

## 2023-10-13 DIAGNOSIS — F419 Anxiety disorder, unspecified: Secondary | ICD-10-CM | POA: Insufficient documentation

## 2023-10-13 DIAGNOSIS — B20 Human immunodeficiency virus [HIV] disease: Secondary | ICD-10-CM

## 2023-10-13 LAB — COMPREHENSIVE METABOLIC PANEL
ALT: 7 U/L (ref 0–44)
AST: 12 U/L — ABNORMAL LOW (ref 15–41)
Albumin: 3.8 g/dL (ref 3.5–5.0)
Alkaline Phosphatase: 65 U/L (ref 38–126)
Anion gap: 10 (ref 5–15)
BUN: 27 mg/dL — ABNORMAL HIGH (ref 6–20)
CO2: 21 mmol/L — ABNORMAL LOW (ref 22–32)
Calcium: 9.4 mg/dL (ref 8.9–10.3)
Chloride: 106 mmol/L (ref 98–111)
Creatinine, Ser: 1.45 mg/dL — ABNORMAL HIGH (ref 0.44–1.00)
GFR, Estimated: 43 mL/min — ABNORMAL LOW (ref >60)
Glucose, Bld: 105 mg/dL — ABNORMAL HIGH (ref 70–99)
Potassium: 4.5 mmol/L (ref 3.5–5.1)
Sodium: 137 mmol/L (ref 135–145)
Total Bilirubin: 0.6 mg/dL (ref 0.0–1.2)
Total Protein: 7.8 g/dL (ref 6.5–8.1)

## 2023-10-13 LAB — CBC WITH DIFFERENTIAL/PLATELET
Abs Immature Granulocytes: 0.01 10*3/uL (ref 0.00–0.07)
Basophils Absolute: 0 10*3/uL (ref 0.0–0.1)
Basophils Relative: 1 %
Eosinophils Absolute: 0.1 10*3/uL (ref 0.0–0.5)
Eosinophils Relative: 6 %
HCT: 31.8 % — ABNORMAL LOW (ref 36.0–46.0)
Hemoglobin: 10.4 g/dL — ABNORMAL LOW (ref 12.0–15.0)
Immature Granulocytes: 0 %
Lymphocytes Relative: 44 %
Lymphs Abs: 1 10*3/uL (ref 0.7–4.0)
MCH: 27.7 pg (ref 26.0–34.0)
MCHC: 32.7 g/dL (ref 30.0–36.0)
MCV: 84.8 fL (ref 80.0–100.0)
Monocytes Absolute: 0.2 10*3/uL (ref 0.1–1.0)
Monocytes Relative: 9 %
Neutro Abs: 0.9 10*3/uL — ABNORMAL LOW (ref 1.7–7.7)
Neutrophils Relative %: 40 %
Platelets: 215 10*3/uL (ref 150–400)
RBC: 3.75 MIL/uL — ABNORMAL LOW (ref 3.87–5.11)
RDW: 13.2 % (ref 11.5–15.5)
Smear Review: NORMAL
WBC: 2.3 10*3/uL — ABNORMAL LOW (ref 4.0–10.5)
nRBC: 0 % (ref 0.0–0.2)

## 2023-10-13 LAB — LIPID PANEL
Cholesterol: 131 mg/dL (ref 0–200)
HDL: 63 mg/dL (ref >40)
LDL Cholesterol: 54 mg/dL (ref 0–99)
Total CHOL/HDL Ratio: 2.1 {ratio}
Triglycerides: 71 mg/dL (ref <150)
VLDL: 14 mg/dL (ref 0–40)

## 2023-10-13 LAB — PATHOLOGIST SMEAR REVIEW

## 2023-10-13 LAB — HEMOGLOBIN A1C
Hgb A1c MFr Bld: 5.7 % — ABNORMAL HIGH (ref 4.8–5.6)
Mean Plasma Glucose: 116.89 mg/dL

## 2023-10-13 MED ORDER — CABOTEGRAVIR & RILPIVIRINE ER 600 & 900 MG/3ML IM SUER
1.0000 | Freq: Once | INTRAMUSCULAR | Status: AC
Start: 2023-10-13 — End: 2023-10-13
  Administered 2023-10-13: 1 via INTRAMUSCULAR
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
## 2023-10-14 LAB — T-HELPER CELLS CD4/CD8 %
% CD 4 Pos. Lymph.: 26.9 % — ABNORMAL LOW (ref 30.8–58.5)
Absolute CD 4 Helper: 242 /uL — ABNORMAL LOW (ref 359–1519)
Basophils Absolute: 0 10*3/uL (ref 0.0–0.2)
Basos: 2 %
CD3+CD4+ Cells/CD3+CD8+ Cells Bld: 0.63 — ABNORMAL LOW (ref 0.92–3.72)
CD3+CD8+ Cells # Bld: 384 /uL (ref 109–897)
CD3+CD8+ Cells NFr Bld: 42.7 % — ABNORMAL HIGH (ref 12.0–35.5)
EOS (ABSOLUTE): 0.1 10*3/uL (ref 0.0–0.4)
Eos: 6 %
Hematocrit: 31.9 % — ABNORMAL LOW (ref 34.0–46.6)
Hemoglobin: 10.5 g/dL — ABNORMAL LOW (ref 11.1–15.9)
Immature Grans (Abs): 0 10*3/uL (ref 0.0–0.1)
Immature Granulocytes: 1 %
Lymphocytes Absolute: 0.9 10*3/uL (ref 0.7–3.1)
Lymphs: 39 %
MCH: 28.2 pg (ref 26.6–33.0)
MCHC: 32.9 g/dL (ref 31.5–35.7)
MCV: 86 fL (ref 79–97)
Monocytes Absolute: 0.2 10*3/uL (ref 0.1–0.9)
Monocytes: 10 %
Neutrophils Absolute: 1 10*3/uL — ABNORMAL LOW (ref 1.4–7.0)
Neutrophils: 42 %
Platelets: 231 10*3/uL (ref 150–450)
RBC: 3.73 x10E6/uL — ABNORMAL LOW (ref 3.77–5.28)
RDW: 13 % (ref 11.7–15.4)
WBC: 2.4 10*3/uL — ABNORMAL LOW (ref 3.4–10.8)

## 2023-10-14 LAB — HIV-1 RNA QUANT-NO REFLEX-BLD
HIV 1 RNA Quant: 30 {copies}/mL
LOG10 HIV-1 RNA: 1.477 {Log}

## 2023-10-17 LAB — QUANTIFERON-TB GOLD PLUS (RQFGPL)
QuantiFERON Mitogen Value: >10 [IU]/mL
QuantiFERON Nil Value: 0.28 [IU]/mL
QuantiFERON TB1 Ag Value: 0.32 [IU]/mL
QuantiFERON TB2 Ag Value: 0.29 [IU]/mL

## 2023-10-17 LAB — QUANTIFERON-TB GOLD PLUS: QuantiFERON-TB Gold Plus: NEGATIVE

## 2023-10-27 ENCOUNTER — Telehealth: Payer: Self-pay

## 2023-10-27 DIAGNOSIS — J069 Acute upper respiratory infection, unspecified: Secondary | ICD-10-CM

## 2023-10-27 NOTE — Telephone Encounter (Signed)
Pt informed of RX sent. Coye Dawood Jonathon Resides, CMA

## 2023-10-27 NOTE — Telephone Encounter (Signed)
-----   Message from [facility] sent at 10/27/2023  9:56 AM EST ----- Cd4 is slightly low but that could because of the viral uppre resp  illness she had when labs were drawn. Can you please check to se ehow she is doing? thx ----- Message ----- From: Leory Plowman, Lab In [place] Sent: 10/13/2023   9:44 AM EST To: Lynn Ito, MD

## 2023-10-27 NOTE — Telephone Encounter (Signed)
Patient informed of labs.  Patient states she is feeling a little better, but still having a lot of coughing.  Savaughn Karwowski Jonathon Resides, CMA

## 2023-10-28 ENCOUNTER — Encounter: Payer: Self-pay | Admitting: Infectious Diseases

## 2023-10-28 MED ORDER — AZITHROMYCIN 250 MG PO TABS
ORAL_TABLET | ORAL | 0 refills | Status: DC
Start: 2023-10-28 — End: 2023-12-20

## 2023-10-28 NOTE — Addendum Note (Signed)
Addended by: Juanita Laster on: 10/28/2023 08:49 AM   Modules accepted: Orders

## 2023-11-22 ENCOUNTER — Other Ambulatory Visit: Payer: Self-pay | Admitting: Cardiology

## 2023-11-23 NOTE — Telephone Encounter (Signed)
 Appointment scheduled for 11/28/23.

## 2023-11-23 NOTE — Telephone Encounter (Signed)
Last office visit: 11/29/22 with plan to f/u 2 weeks after procedure.  Cath done on 11/24/22  next office visit: none/NO active recall/pt cx 01/24/23 visit  Cath done on 11/24/22  Please schedule f/u appt.  Thanks!

## 2023-11-24 ENCOUNTER — Encounter: Payer: Self-pay | Admitting: *Deleted

## 2023-11-25 NOTE — Progress Notes (Deleted)
  Cardiology Office Note:  .   Date:  11/25/2023  ID:  Alicia Lynch, DOB 1966-06-23, MRN [id number] PCP: Louis Matte, MD  [facility] Providers Cardiologist:  Debbe Odea, MD { Click to update primary MD,subspecialty MD or APP then REFRESH:1}   History of Present Illness: .   Alicia Lynch is a 58 y.o. female with past medical history of hypertension, morbid obesity, HIV infection, shortness of breath, fatigue, peripheral edema, mixed hyperlipidemia, who is here today for follow-up on hypertension and chronic shortness of breath.   Last echocardiogram revealed LVEF 55 to 60%, coronary CTA revealed a coronary calcium score of 0.  With normal coronary arteries with left dominance and no evidence of coronary artery disease.  She continued shortness of breath in 10/24/2022 was undergoing workup with pulmonary.  Then it was requested that she have a right heart catheterization completed to evaluate for pulmonary hypertension.   She was last seen in clinic 11/19/2022.  At that time she continued to have complaints of shortness of breath.  She recently followed up with pulmonary and had PFTs completed.  With her continued shortness of breath she was scheduled for a right heart catheterization.  Right heart catheter revealed mildly elevated left heart, right heart, pulmonary artery pressures as detailed in the report.  Normal Fick cardiac output.  Furosemide was increased to 40 mg daily and ongoing management per primary cardiology and pulmonary.  She returns to clinic today  ROS: Review of systems has been reviewed and considered negative except what is been listed in HPI  Studies Reviewed: Marland Kitchen       [facility] 11/24/2022 Conclusions: Mildly elevated left heart, right heart, and pulmonary artery pressures, as detailed below. Normal Fick cardiac output/index.   Recommendations: Increase furosemide to 40 mg daily. Ongoing management of shortness of breath per Drs. Agbor-Etang  and Dgayli.   Risk Assessment/Calculations:     No BP recorded.  {Refresh Note OR Click here to enter BP  :1}***       Physical Exam:   VS:  LMP 07/04/2016 Comment: Negative blood hCG 05/12/17   Wt Readings from Last 3 Encounters:  10/13/23 281 lb (127.5 kg)  08/11/23 276 lb (125.2 kg)  04/12/23 284 lb (128.8 kg)    GEN: Well nourished, well developed in no acute distress NECK: No JVD; No carotid bruits CARDIAC: ***RRR, no murmurs, rubs, gallops RESPIRATORY:  Clear to auscultation without rales, wheezing or rhonchi  ABDOMEN: Soft, non-tender, non-distended EXTREMITIES:  No edema; No deformity   ASSESSMENT AND PLAN: .   Chronic shortness of breath and dyspnea on exertion Primary hypertension Mixed hyperlipidemia Peripheral edema Morbid obesity HIV    {Are you ordering a CV Procedure (e.g. stress test, cath, DCCV, TEE, etc)?   Press F2        :[id number]}  Dispo: ***  Signed, Antavius Sperbeck, NP

## 2023-11-28 ENCOUNTER — Ambulatory Visit: Payer: Medicare HMO | Attending: Cardiology | Admitting: Cardiology

## 2023-12-06 ENCOUNTER — Other Ambulatory Visit: Payer: Self-pay

## 2023-12-06 ENCOUNTER — Other Ambulatory Visit (HOSPITAL_COMMUNITY): Payer: Self-pay

## 2023-12-06 NOTE — Progress Notes (Signed)
 Specialty Pharmacy Refill Coordination Note  Shaylen Nephew is a 58 y.o. female assessed today regarding refills of clinic administered specialty medication(s) Cabotegravir & Rilpivirine [facility])   Clinic requested Courier to Provider Office   Delivery date: 12/14/23   Verified address: [address] [place] [address]   Medication will be filled on 12/13/23.

## 2023-12-13 ENCOUNTER — Ambulatory Visit: Payer: 59 | Admitting: Infectious Diseases

## 2023-12-20 ENCOUNTER — Encounter: Payer: Self-pay | Admitting: Infectious Diseases

## 2023-12-20 ENCOUNTER — Ambulatory Visit: Payer: 59 | Attending: Infectious Diseases | Admitting: Infectious Diseases

## 2023-12-20 VITALS — BP 153/103 | HR 80 | Temp 97.2°F | Ht 64.0 in | Wt 290.0 lb

## 2023-12-20 DIAGNOSIS — B20 Human immunodeficiency virus [HIV] disease: Secondary | ICD-10-CM | POA: Diagnosis not present

## 2023-12-20 DIAGNOSIS — Z6841 Body Mass Index (BMI) 40.0 and over, adult: Secondary | ICD-10-CM | POA: Insufficient documentation

## 2023-12-20 DIAGNOSIS — Z79899 Other long term (current) drug therapy: Secondary | ICD-10-CM | POA: Diagnosis not present

## 2023-12-20 DIAGNOSIS — J4489 Other specified chronic obstructive pulmonary disease: Secondary | ICD-10-CM | POA: Insufficient documentation

## 2023-12-20 DIAGNOSIS — Z21 Asymptomatic human immunodeficiency virus [HIV] infection status: Secondary | ICD-10-CM | POA: Insufficient documentation

## 2023-12-20 DIAGNOSIS — F419 Anxiety disorder, unspecified: Secondary | ICD-10-CM | POA: Insufficient documentation

## 2023-12-20 DIAGNOSIS — E1122 Type 2 diabetes mellitus with diabetic chronic kidney disease: Secondary | ICD-10-CM | POA: Insufficient documentation

## 2023-12-20 DIAGNOSIS — G4730 Sleep apnea, unspecified: Secondary | ICD-10-CM | POA: Diagnosis not present

## 2023-12-20 DIAGNOSIS — I129 Hypertensive chronic kidney disease with stage 1 through stage 4 chronic kidney disease, or unspecified chronic kidney disease: Secondary | ICD-10-CM | POA: Diagnosis not present

## 2023-12-20 DIAGNOSIS — F32A Depression, unspecified: Secondary | ICD-10-CM | POA: Diagnosis not present

## 2023-12-20 DIAGNOSIS — E119 Type 2 diabetes mellitus without complications: Secondary | ICD-10-CM | POA: Insufficient documentation

## 2023-12-20 DIAGNOSIS — D649 Anemia, unspecified: Secondary | ICD-10-CM | POA: Insufficient documentation

## 2023-12-20 DIAGNOSIS — N189 Chronic kidney disease, unspecified: Secondary | ICD-10-CM | POA: Insufficient documentation

## 2023-12-20 DIAGNOSIS — I428 Other cardiomyopathies: Secondary | ICD-10-CM | POA: Diagnosis not present

## 2023-12-20 DIAGNOSIS — E669 Obesity, unspecified: Secondary | ICD-10-CM | POA: Diagnosis not present

## 2023-12-20 MED ORDER — CABOTEGRAVIR & RILPIVIRINE ER 600 & 900 MG/3ML IM SUER
1.0000 | Freq: Once | INTRAMUSCULAR | Status: AC
Start: 2023-12-20 — End: 2023-12-20
  Administered 2023-12-20: 1 via INTRAMUSCULAR
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
## 2023-12-20 NOTE — Patient Instructions (Signed)
 HIV infection   Recent labs from January 9th show a viral load of 30, which is acceptable, and a T cell count of 242 (26.9%), indicating a drop in absolute number but stable percentage-wise. Continue current HIV management regimen. Check viral load and T cell count in two months.  Chronic kidney disease   Creatinine level is 1.45, indicating worsening kidney function. Meloxicam use may be contributing to renal impairment. Discuss kidney function and medication use with primary care physician. Consider alternative pain management options to meloxicam.  Anemia   Hemoglobin has decreased from 11.9 to 10.5, indicating increased anemia. Discuss anemia management with primary care physician.  Hypertension   Blood pressure was elevated during the visit, possibly due to a headache. Discuss blood pressure management with primary care physician.  Obesity   Weight has increased significantly since 2020, with current fluctuations around 6 pounds. She is interested in weight management options, including medication, but has faced insurance issues. Discuss potential eligibility for weight reduction medications with primary care physician. Encourage lifestyle modifications such as diet and exercise.  Asthma   She is on montelukast for asthma management. No acute issues reported during the visit. Continue current asthma management regimen.  Follow-up   She needs to follow up with primary care physician to address multiple health concerns, including kidney function, anemia, hypertension, and weight management. Schedule an appointment with primary care physician as soon as possible. Reassess kidney function and hemoglobin A1c at primary care visit.

## 2024-01-30 ENCOUNTER — Other Ambulatory Visit (HOSPITAL_COMMUNITY): Payer: Self-pay

## 2024-01-30 ENCOUNTER — Other Ambulatory Visit: Payer: Self-pay

## 2024-01-30 NOTE — Progress Notes (Signed)
 Specialty Pharmacy Refill Coordination Note  Alicia Lynch is a 58 y.o. female assessed today regarding refills of clinic administered specialty medication(s) Cabotegravir  & Rilpivirine  (CABENUVA )   Clinic requested Courier to Provider Office   Delivery date: 02/07/24   Verified address: [address] [place] [address]   Medication will be filled on 02/06/24.

## 2024-02-14 ENCOUNTER — Ambulatory Visit: Attending: Infectious Diseases | Admitting: Infectious Diseases

## 2024-02-14 ENCOUNTER — Encounter: Payer: Self-pay | Admitting: Infectious Diseases

## 2024-02-14 ENCOUNTER — Other Ambulatory Visit
Admission: RE | Admit: 2024-02-14 | Discharge: 2024-02-14 | Disposition: A | Discharge status: Home / Self Care | Source: Ambulatory Visit | Attending: Infectious Diseases | Admitting: Infectious Diseases

## 2024-02-14 VITALS — BP 97/65 | HR 92 | Temp 97.3°F | Ht 64.0 in | Wt 288.0 lb

## 2024-02-14 DIAGNOSIS — D649 Anemia, unspecified: Secondary | ICD-10-CM | POA: Diagnosis not present

## 2024-02-14 DIAGNOSIS — E1122 Type 2 diabetes mellitus with diabetic chronic kidney disease: Secondary | ICD-10-CM | POA: Diagnosis not present

## 2024-02-14 DIAGNOSIS — Z7985 Long-term (current) use of injectable non-insulin antidiabetic drugs: Secondary | ICD-10-CM | POA: Insufficient documentation

## 2024-02-14 DIAGNOSIS — E669 Obesity, unspecified: Secondary | ICD-10-CM | POA: Insufficient documentation

## 2024-02-14 DIAGNOSIS — I10 Essential (primary) hypertension: Secondary | ICD-10-CM | POA: Diagnosis not present

## 2024-02-14 DIAGNOSIS — Z79899 Other long term (current) drug therapy: Secondary | ICD-10-CM | POA: Diagnosis not present

## 2024-02-14 DIAGNOSIS — Z21 Asymptomatic human immunodeficiency virus [HIV] infection status: Secondary | ICD-10-CM | POA: Insufficient documentation

## 2024-02-14 DIAGNOSIS — I428 Other cardiomyopathies: Secondary | ICD-10-CM | POA: Insufficient documentation

## 2024-02-14 DIAGNOSIS — Z6841 Body Mass Index (BMI) 40.0 and over, adult: Secondary | ICD-10-CM | POA: Insufficient documentation

## 2024-02-14 DIAGNOSIS — I129 Hypertensive chronic kidney disease with stage 1 through stage 4 chronic kidney disease, or unspecified chronic kidney disease: Secondary | ICD-10-CM | POA: Diagnosis not present

## 2024-02-14 DIAGNOSIS — F419 Anxiety disorder, unspecified: Secondary | ICD-10-CM | POA: Diagnosis not present

## 2024-02-14 DIAGNOSIS — B20 Human immunodeficiency virus [HIV] disease: Secondary | ICD-10-CM | POA: Diagnosis not present

## 2024-02-14 DIAGNOSIS — F32A Depression, unspecified: Secondary | ICD-10-CM | POA: Diagnosis not present

## 2024-02-14 DIAGNOSIS — J45909 Unspecified asthma, uncomplicated: Secondary | ICD-10-CM | POA: Diagnosis not present

## 2024-02-14 DIAGNOSIS — N189 Chronic kidney disease, unspecified: Secondary | ICD-10-CM | POA: Insufficient documentation

## 2024-02-14 DIAGNOSIS — Z1231 Encounter for screening mammogram for malignant neoplasm of breast: Secondary | ICD-10-CM

## 2024-02-14 DIAGNOSIS — Z01419 Encounter for gynecological examination (general) (routine) without abnormal findings: Secondary | ICD-10-CM

## 2024-02-14 LAB — COMPREHENSIVE METABOLIC PANEL WITH GFR
ALT: 10 U/L (ref 0–44)
AST: 13 U/L — ABNORMAL LOW (ref 15–41)
Albumin: 3.7 g/dL (ref 3.5–5.0)
Alkaline Phosphatase: 58 U/L (ref 38–126)
Anion gap: 10 (ref 5–15)
BUN: 28 mg/dL — ABNORMAL HIGH (ref 6–20)
CO2: 22 mmol/L (ref 22–32)
Calcium: 9.3 mg/dL (ref 8.9–10.3)
Chloride: 107 mmol/L (ref 98–111)
Creatinine, Ser: 1.87 mg/dL — ABNORMAL HIGH (ref 0.44–1.00)
GFR, Estimated: 31 mL/min — ABNORMAL LOW (ref >60)
Glucose, Bld: 120 mg/dL — ABNORMAL HIGH (ref 70–99)
Potassium: 4 mmol/L (ref 3.5–5.1)
Sodium: 139 mmol/L (ref 135–145)
Total Bilirubin: 0.9 mg/dL (ref 0.0–1.2)
Total Protein: 7.5 g/dL (ref 6.5–8.1)

## 2024-02-14 LAB — CBC WITH DIFFERENTIAL/PLATELET
Abs Immature Granulocytes: 0.01 10*3/uL (ref 0.00–0.07)
Basophils Absolute: 0 10*3/uL (ref 0.0–0.1)
Basophils Relative: 1 %
Eosinophils Absolute: 0.1 10*3/uL (ref 0.0–0.5)
Eosinophils Relative: 1 %
HCT: 33.3 % — ABNORMAL LOW (ref 36.0–46.0)
Hemoglobin: 10.8 g/dL — ABNORMAL LOW (ref 12.0–15.0)
Immature Granulocytes: 0 %
Lymphocytes Relative: 45 %
Lymphs Abs: 1.9 10*3/uL (ref 0.7–4.0)
MCH: 26.7 pg (ref 26.0–34.0)
MCHC: 32.4 g/dL (ref 30.0–36.0)
MCV: 82.2 fL (ref 80.0–100.0)
Monocytes Absolute: 0.3 10*3/uL (ref 0.1–1.0)
Monocytes Relative: 8 %
Neutro Abs: 1.9 10*3/uL (ref 1.7–7.7)
Neutrophils Relative %: 45 %
Platelets: 245 10*3/uL (ref 150–400)
RBC: 4.05 MIL/uL (ref 3.87–5.11)
RDW: 14 % (ref 11.5–15.5)
WBC: 4.3 10*3/uL (ref 4.0–10.5)
nRBC: 0 % (ref 0.0–0.2)

## 2024-02-14 MED ORDER — CABOTEGRAVIR & RILPIVIRINE ER 600 & 900 MG/3ML IM SUER
1.0000 | Freq: Once | INTRAMUSCULAR | Status: AC
Start: 2024-02-14 — End: 2024-02-14
  Administered 2024-02-14: 1 via INTRAMUSCULAR
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
## 2024-02-14 NOTE — Patient Instructions (Addendum)
 Today, you came in for your routine  HIVvisit and received your Cabenuva  injection. We discussed your current medications and overall health, including your weight loss progress and stress related to family dynamics.  YOUR PLAN:  -HIV INFECTION: Your HIV is well-managed with your bi-monthly Cabenuva  injections, which include cabotegravir  and rilpivirine . Your last viral load check in January was undetectable, which is excellent news. We administered your Cabenuva  injection today and ordered T cell count and viral load tests to monitor your condition.  -OBESITY: You have been managing your weight with Ozempic, which you started in April. You have lost 2 pounds, going from 290 pounds in March to 288 pounds now. You have not reported any side effects from the medication.  -BORDERLINE DIABETES: Your borderline diabetes is being managed with Ozempic, which also helps control your blood sugar levels. No new medications are needed at this time.  -HYPERTENSION: Your blood pressure is well-controlled with your current medications, and it has improved to 97/65 mmHg after stopping meloxicam . This is a positive development.  -ANEMIA: Your anemia persists with a hemoglobin level of 10.5. We did not discuss any new interventions today.  -ASTHMA: Your asthma is being managed with montelukast , and you have not reported any new symptoms or flare-ups.  -DEPRESSION: Your depression is being managed with buspirone and risperidone. You mentioned feeling stressed due to family dynamics, particularly with your children. We recommend considering a referral to a psychiatrist or psychologist for additional support.  INSTRUCTIONS:  We have ordered T cell count and viral load tests to monitor your HIV. Aaron Aas Continue taking your current medications as prescribed and follow up with us  for your next Cabenuva  injection in two months. For pap smear and gyn examination referring you to Dr. Teresa Fender  [address],  [place], [place] [address] Phone: [phone]

## 2024-02-15 LAB — T-HELPER CELLS CD4/CD8 %
% CD 4 Pos. Lymph.: 28.3 % — ABNORMAL LOW (ref 30.8–58.5)
Absolute CD 4 Helper: 566 /uL (ref 359–1519)
Basophils Absolute: 0 10*3/uL (ref 0.0–0.2)
Basos: 1 %
CD3+CD4+ Cells/CD3+CD8+ Cells Bld: 0.65 — ABNORMAL LOW (ref 0.92–3.72)
CD3+CD8+ Cells # Bld: 870 /uL (ref 109–897)
CD3+CD8+ Cells NFr Bld: 43.5 % — ABNORMAL HIGH (ref 12.0–35.5)
EOS (ABSOLUTE): 0.1 10*3/uL (ref 0.0–0.4)
Eos: 1 %
Hematocrit: 34.1 % (ref 34.0–46.6)
Hemoglobin: 11 g/dL — ABNORMAL LOW (ref 11.1–15.9)
Immature Grans (Abs): 0 10*3/uL (ref 0.0–0.1)
Immature Granulocytes: 0 %
Lymphocytes Absolute: 2 10*3/uL (ref 0.7–3.1)
Lymphs: 46 %
MCH: 26.8 pg (ref 26.6–33.0)
MCHC: 32.3 g/dL (ref 31.5–35.7)
MCV: 83 fL (ref 79–97)
Monocytes Absolute: 0.3 10*3/uL (ref 0.1–0.9)
Monocytes: 7 %
Neutrophils Absolute: 2 10*3/uL (ref 1.4–7.0)
Neutrophils: 45 %
Platelets: 253 10*3/uL (ref 150–450)
RBC: 4.11 x10E6/uL (ref 3.77–5.28)
RDW: 13.8 % (ref 11.7–15.4)
WBC: 4.4 10*3/uL (ref 3.4–10.8)

## 2024-02-15 LAB — RPR: RPR Ser Ql: NONREACTIVE

## 2024-02-15 LAB — HIV-1 RNA QUANT-NO REFLEX-BLD
HIV 1 RNA Quant: <20 {copies}/mL
LOG10 HIV-1 RNA: UNDETERMINED {Log_copies}/mL

## 2024-02-16 ENCOUNTER — Ambulatory Visit: Payer: Self-pay

## 2024-02-16 NOTE — Telephone Encounter (Signed)
-----   Message from [facility] sent at 02/16/2024  1:20 PM EDT ----- Please let her know that cd4 is 554, VL < 20 but her cr is 1.87 ( normal is 1) she should discuss with her PCP. thx ----- Message ----- From: Dannis Dy, [facility] [place] Sent: 02/14/2024   9:38 AM EDT To: Alica Inks, MD

## 2024-02-16 NOTE — Telephone Encounter (Signed)
 Patient informed of lab results and advised to reach out to pcp regarding her Cr.  Patient verbalized understanding

## 2024-02-20 ENCOUNTER — Encounter: Admitting: Obstetrics

## 2024-02-20 ENCOUNTER — Encounter: Payer: Self-pay | Admitting: Obstetrics

## 2024-02-23 ENCOUNTER — Encounter

## 2024-02-23 NOTE — Progress Notes (Signed)
 The 10-year ASCVD risk score (Arnett DK, et al., 2019) is: 2%   Values used to calculate the score:     Age: 58 years     Sex: Female     Is Non-Hispanic African American: Yes     Diabetic: No     Tobacco smoker: No     Systolic Blood Pressure: 112 mmHg     Is BP treated: Yes     HDL Cholesterol: 63 mg/dL     Total Cholesterol: 131 mg/dL  Currently prescribed rosuvastatin  5 mg.  Zamari Bonsall, BSN, RN

## 2024-03-01 ENCOUNTER — Encounter: Payer: Self-pay | Admitting: Obstetrics

## 2024-03-13 ENCOUNTER — Encounter

## 2024-04-05 ENCOUNTER — Other Ambulatory Visit: Payer: Self-pay

## 2024-04-05 ENCOUNTER — Other Ambulatory Visit (HOSPITAL_COMMUNITY): Payer: Self-pay

## 2024-04-05 NOTE — Progress Notes (Signed)
 Specialty Pharmacy Refill Coordination Note  Alicia Lynch is a 58 y.o. female assessed today regarding refills of clinic administered specialty medication(s) Cabotegravir  & Rilpivirine  (CABENUVA )   Clinic requested Courier to Provider Office   Delivery date: 04/10/24   Verified address: [address] [place] [address]   Medication will be filled on 04/09/24.

## 2024-04-09 ENCOUNTER — Other Ambulatory Visit (HOSPITAL_COMMUNITY): Payer: Self-pay

## 2024-04-11 ENCOUNTER — Encounter: Admitting: Advanced Practice Midwife

## 2024-04-17 ENCOUNTER — Encounter: Payer: Self-pay | Admitting: Infectious Diseases

## 2024-04-17 ENCOUNTER — Ambulatory Visit: Attending: Infectious Diseases | Admitting: Infectious Diseases

## 2024-04-17 VITALS — BP 143/94 | HR 80 | Temp 97.8°F | Ht 64.0 in | Wt 291.0 lb

## 2024-04-17 DIAGNOSIS — R7303 Prediabetes: Secondary | ICD-10-CM | POA: Insufficient documentation

## 2024-04-17 DIAGNOSIS — D631 Anemia in chronic kidney disease: Secondary | ICD-10-CM | POA: Insufficient documentation

## 2024-04-17 DIAGNOSIS — I428 Other cardiomyopathies: Secondary | ICD-10-CM | POA: Insufficient documentation

## 2024-04-17 DIAGNOSIS — Z79899 Other long term (current) drug therapy: Secondary | ICD-10-CM | POA: Insufficient documentation

## 2024-04-17 DIAGNOSIS — E669 Obesity, unspecified: Secondary | ICD-10-CM | POA: Insufficient documentation

## 2024-04-17 DIAGNOSIS — N189 Chronic kidney disease, unspecified: Secondary | ICD-10-CM | POA: Insufficient documentation

## 2024-04-17 DIAGNOSIS — J45909 Unspecified asthma, uncomplicated: Secondary | ICD-10-CM | POA: Insufficient documentation

## 2024-04-17 DIAGNOSIS — G4730 Sleep apnea, unspecified: Secondary | ICD-10-CM | POA: Diagnosis not present

## 2024-04-17 DIAGNOSIS — Z6841 Body Mass Index (BMI) 40.0 and over, adult: Secondary | ICD-10-CM | POA: Diagnosis not present

## 2024-04-17 DIAGNOSIS — B20 Human immunodeficiency virus [HIV] disease: Secondary | ICD-10-CM | POA: Insufficient documentation

## 2024-04-17 DIAGNOSIS — I129 Hypertensive chronic kidney disease with stage 1 through stage 4 chronic kidney disease, or unspecified chronic kidney disease: Secondary | ICD-10-CM | POA: Diagnosis not present

## 2024-04-17 DIAGNOSIS — Z7985 Long-term (current) use of injectable non-insulin antidiabetic drugs: Secondary | ICD-10-CM | POA: Diagnosis not present

## 2024-04-17 MED ORDER — CABOTEGRAVIR & RILPIVIRINE ER 600 & 900 MG/3ML IM SUER
1.0000 | Freq: Once | INTRAMUSCULAR | Status: AC
  Administered 2024-04-17: 1 via INTRAMUSCULAR
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
## 2024-05-31 ENCOUNTER — Other Ambulatory Visit: Payer: Self-pay

## 2024-05-31 ENCOUNTER — Other Ambulatory Visit: Payer: Self-pay | Admitting: Pharmacist

## 2024-05-31 ENCOUNTER — Other Ambulatory Visit (HOSPITAL_COMMUNITY): Payer: Self-pay

## 2024-05-31 DIAGNOSIS — B20 Human immunodeficiency virus [HIV] disease: Secondary | ICD-10-CM

## 2024-05-31 MED ORDER — CABOTEGRAVIR & RILPIVIRINE ER 600 & 900 MG/3ML IM SUER
1.0000 | INTRAMUSCULAR | 5 refills | Status: AC
  Filled 2024-05-31: qty 6, 60d supply, fill #0
  Filled 2024-08-01: qty 6, 60d supply, fill #1
  Filled 2024-09-04: qty 6, 60d supply, fill #2

## 2024-05-31 NOTE — Progress Notes (Signed)
 Specialty Pharmacy Refill Coordination Note  Alicia Lynch is a 58 y.o. female assessed today regarding refills of clinic administered specialty medication(s) Cabotegravir  & Rilpivirine  (CABENUVA )   Clinic requested Courier to Provider Office   Delivery date: 06/07/24   Verified address: [address] [employer] [phone]   Medication will be filled on 06/06/24.

## 2024-06-06 ENCOUNTER — Other Ambulatory Visit: Payer: Self-pay

## 2024-06-14 ENCOUNTER — Ambulatory Visit: Attending: Infectious Diseases | Admitting: Infectious Diseases

## 2024-06-14 ENCOUNTER — Encounter: Payer: Self-pay | Admitting: Infectious Diseases

## 2024-06-14 VITALS — BP 143/99 | HR 62 | Temp 97.3°F | Ht 64.0 in | Wt 286.0 lb

## 2024-06-14 DIAGNOSIS — N189 Chronic kidney disease, unspecified: Secondary | ICD-10-CM | POA: Diagnosis not present

## 2024-06-14 DIAGNOSIS — F419 Anxiety disorder, unspecified: Secondary | ICD-10-CM | POA: Insufficient documentation

## 2024-06-14 DIAGNOSIS — E669 Obesity, unspecified: Secondary | ICD-10-CM | POA: Diagnosis not present

## 2024-06-14 DIAGNOSIS — Z79899 Other long term (current) drug therapy: Secondary | ICD-10-CM | POA: Diagnosis not present

## 2024-06-14 DIAGNOSIS — Z21 Asymptomatic human immunodeficiency virus [HIV] infection status: Secondary | ICD-10-CM | POA: Diagnosis present

## 2024-06-14 DIAGNOSIS — Z8249 Family history of ischemic heart disease and other diseases of the circulatory system: Secondary | ICD-10-CM | POA: Insufficient documentation

## 2024-06-14 DIAGNOSIS — I428 Other cardiomyopathies: Secondary | ICD-10-CM | POA: Diagnosis not present

## 2024-06-14 DIAGNOSIS — Z7951 Long term (current) use of inhaled steroids: Secondary | ICD-10-CM | POA: Diagnosis not present

## 2024-06-14 DIAGNOSIS — E1122 Type 2 diabetes mellitus with diabetic chronic kidney disease: Secondary | ICD-10-CM | POA: Insufficient documentation

## 2024-06-14 DIAGNOSIS — F32A Depression, unspecified: Secondary | ICD-10-CM | POA: Insufficient documentation

## 2024-06-14 DIAGNOSIS — Z833 Family history of diabetes mellitus: Secondary | ICD-10-CM | POA: Insufficient documentation

## 2024-06-14 DIAGNOSIS — B20 Human immunodeficiency virus [HIV] disease: Secondary | ICD-10-CM | POA: Diagnosis not present

## 2024-06-14 DIAGNOSIS — D649 Anemia, unspecified: Secondary | ICD-10-CM | POA: Insufficient documentation

## 2024-06-14 DIAGNOSIS — I129 Hypertensive chronic kidney disease with stage 1 through stage 4 chronic kidney disease, or unspecified chronic kidney disease: Secondary | ICD-10-CM

## 2024-06-14 DIAGNOSIS — G4730 Sleep apnea, unspecified: Secondary | ICD-10-CM | POA: Insufficient documentation

## 2024-06-14 DIAGNOSIS — J45909 Unspecified asthma, uncomplicated: Secondary | ICD-10-CM | POA: Insufficient documentation

## 2024-06-14 DIAGNOSIS — Z7985 Long-term (current) use of injectable non-insulin antidiabetic drugs: Secondary | ICD-10-CM | POA: Diagnosis not present

## 2024-06-14 DIAGNOSIS — Z6841 Body Mass Index (BMI) 40.0 and over, adult: Secondary | ICD-10-CM | POA: Diagnosis not present

## 2024-06-14 DIAGNOSIS — Z23 Encounter for immunization: Secondary | ICD-10-CM

## 2024-06-14 MED ORDER — CABOTEGRAVIR & RILPIVIRINE ER 600 & 900 MG/3ML IM SUER
1.0000 | Freq: Once | INTRAMUSCULAR | Status: AC
  Administered 2024-06-14: 1 via INTRAMUSCULAR
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
## 2024-08-01 ENCOUNTER — Other Ambulatory Visit: Payer: Self-pay

## 2024-08-01 ENCOUNTER — Encounter: Payer: Self-pay | Admitting: Cardiology

## 2024-08-01 ENCOUNTER — Other Ambulatory Visit (HOSPITAL_COMMUNITY): Payer: Self-pay

## 2024-08-01 NOTE — Progress Notes (Signed)
 Specialty Pharmacy Refill Coordination Note  Alicia Lynch is a 58 y.o. female assessed today regarding refills of clinic administered specialty medication(s) Cabotegravir  & Rilpivirine  (CABENUVA )   Clinic requested Courier to Provider Office   Delivery date: 08/09/24   Verified address: [address] [place] [address]   Medication will be filled on 08/08/24.

## 2024-08-08 ENCOUNTER — Other Ambulatory Visit: Payer: Self-pay

## 2024-08-14 ENCOUNTER — Encounter: Payer: Self-pay | Admitting: Infectious Diseases

## 2024-08-14 ENCOUNTER — Ambulatory Visit: Attending: Infectious Diseases | Admitting: Infectious Diseases

## 2024-08-14 VITALS — BP 161/104 | HR 79

## 2024-08-14 DIAGNOSIS — Z7985 Long-term (current) use of injectable non-insulin antidiabetic drugs: Secondary | ICD-10-CM | POA: Insufficient documentation

## 2024-08-14 DIAGNOSIS — E1122 Type 2 diabetes mellitus with diabetic chronic kidney disease: Secondary | ICD-10-CM | POA: Insufficient documentation

## 2024-08-14 DIAGNOSIS — B20 Human immunodeficiency virus [HIV] disease: Secondary | ICD-10-CM

## 2024-08-14 DIAGNOSIS — Z21 Asymptomatic human immunodeficiency virus [HIV] infection status: Secondary | ICD-10-CM | POA: Insufficient documentation

## 2024-08-14 DIAGNOSIS — N189 Chronic kidney disease, unspecified: Secondary | ICD-10-CM | POA: Diagnosis not present

## 2024-08-14 DIAGNOSIS — F329 Major depressive disorder, single episode, unspecified: Secondary | ICD-10-CM | POA: Insufficient documentation

## 2024-08-14 DIAGNOSIS — I428 Other cardiomyopathies: Secondary | ICD-10-CM | POA: Diagnosis not present

## 2024-08-14 DIAGNOSIS — D631 Anemia in chronic kidney disease: Secondary | ICD-10-CM | POA: Insufficient documentation

## 2024-08-14 DIAGNOSIS — J45909 Unspecified asthma, uncomplicated: Secondary | ICD-10-CM | POA: Insufficient documentation

## 2024-08-14 DIAGNOSIS — F419 Anxiety disorder, unspecified: Secondary | ICD-10-CM | POA: Insufficient documentation

## 2024-08-14 DIAGNOSIS — Z79899 Other long term (current) drug therapy: Secondary | ICD-10-CM | POA: Diagnosis not present

## 2024-08-14 DIAGNOSIS — I129 Hypertensive chronic kidney disease with stage 1 through stage 4 chronic kidney disease, or unspecified chronic kidney disease: Secondary | ICD-10-CM

## 2024-08-14 DIAGNOSIS — E669 Obesity, unspecified: Secondary | ICD-10-CM | POA: Insufficient documentation

## 2024-08-14 MED ORDER — CABOTEGRAVIR & RILPIVIRINE ER 600 & 900 MG/3ML IM SUER
1.0000 | Freq: Once | INTRAMUSCULAR | Status: AC
  Administered 2024-08-14: 1 via INTRAMUSCULAR
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
## 2024-08-15 ENCOUNTER — Telehealth: Payer: Self-pay

## 2024-08-15 NOTE — Telephone Encounter (Signed)
 Patient left voicemail stating she is not able to get labs this week due to not feeling well. Will go Monday to have labs done. Alicia Lynch Code, RMA
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
## 2024-09-04 ENCOUNTER — Encounter: Admitting: Certified Nurse Midwife

## 2024-09-04 ENCOUNTER — Other Ambulatory Visit (HOSPITAL_COMMUNITY): Payer: Self-pay

## 2024-09-04 ENCOUNTER — Other Ambulatory Visit: Payer: Self-pay

## 2024-09-04 DIAGNOSIS — Z01419 Encounter for gynecological examination (general) (routine) without abnormal findings: Secondary | ICD-10-CM

## 2024-09-04 DIAGNOSIS — Z1151 Encounter for screening for human papillomavirus (HPV): Secondary | ICD-10-CM

## 2024-09-04 DIAGNOSIS — Z124 Encounter for screening for malignant neoplasm of cervix: Secondary | ICD-10-CM

## 2024-09-04 NOTE — Progress Notes (Addendum)
 Specialty Pharmacy Refill Coordination Note  Alicia Lynch is a 58 y.o. female assessed today regarding refills of clinic administered specialty medication(s) Cabotegravir  & Rilpivirine  (CABENUVA )   Clinic requested Courier to Provider Office   Delivery date: 10/02/24  Verified address: [address] [place] [address]   Medication will be filled on 10/01/24.

## 2024-09-04 NOTE — Progress Notes (Signed)
 Patient's refill has been scheduled in [place].

## 2024-09-10 ENCOUNTER — Other Ambulatory Visit (HOSPITAL_COMMUNITY): Payer: Self-pay

## 2024-09-10 ENCOUNTER — Other Ambulatory Visit: Payer: Self-pay

## 2024-10-01 ENCOUNTER — Other Ambulatory Visit: Payer: Self-pay

## 2024-10-08 ENCOUNTER — Other Ambulatory Visit (HOSPITAL_COMMUNITY): Payer: Self-pay

## 2024-10-16 ENCOUNTER — Other Ambulatory Visit: Payer: Self-pay | Admitting: Infectious Diseases

## 2024-10-16 ENCOUNTER — Ambulatory Visit: Attending: Infectious Diseases

## 2024-10-16 ENCOUNTER — Other Ambulatory Visit
Admission: RE | Admit: 2024-10-16 | Discharge: 2024-10-16 | Disposition: A | Discharge status: Home / Self Care | Source: Ambulatory Visit | Attending: Infectious Diseases | Admitting: Infectious Diseases

## 2024-10-16 DIAGNOSIS — B20 Human immunodeficiency virus [HIV] disease: Secondary | ICD-10-CM

## 2024-10-16 LAB — COMPREHENSIVE METABOLIC PANEL WITH GFR
ALT: 16 U/L (ref 0–44)
AST: 15 U/L (ref 15–41)
Albumin: 4.1 g/dL (ref 3.5–5.0)
Alkaline Phosphatase: 71 U/L (ref 38–126)
Anion gap: 10 (ref 5–15)
BUN: 8 mg/dL (ref 6–20)
CO2: 26 mmol/L (ref 22–32)
Calcium: 10 mg/dL (ref 8.9–10.3)
Chloride: 107 mmol/L (ref 98–111)
Creatinine, Ser: 0.95 mg/dL (ref 0.44–1.00)
GFR, Estimated: >60 mL/min
Glucose, Bld: 101 mg/dL — ABNORMAL HIGH (ref 70–99)
Potassium: 4 mmol/L (ref 3.5–5.1)
Sodium: 142 mmol/L (ref 135–145)
Total Bilirubin: 0.5 mg/dL (ref 0.0–1.2)
Total Protein: 7.4 g/dL (ref 6.5–8.1)

## 2024-10-16 MED ORDER — CABOTEGRAVIR & RILPIVIRINE ER 600 & 900 MG/3ML IM SUER
1.0000 | Freq: Once | INTRAMUSCULAR | Status: AC
  Administered 2024-10-16: 1 via INTRAMUSCULAR

## 2024-10-16 NOTE — Progress Notes (Unsigned)
 PATIENT IN OFFICE FOR CABENUVA  INJECTIONS TOLERATED WELL.

## 2024-10-17 LAB — HIV-1 RNA QUANT-NO REFLEX-BLD
HIV 1 RNA Quant: 40 {copies}/mL
LOG10 HIV-1 RNA: 1.602 {Log_copies}/mL

## 2024-10-17 LAB — SYPHILIS: RPR W/REFLEX TO RPR TITER AND TREPONEMAL ANTIBODIES, TRADITIONAL SCREENING AND DIAGNOSIS ALGORITHM: RPR Ser Ql: NONREACTIVE

## 2024-12-13 ENCOUNTER — Ambulatory Visit: Admitting: Infectious Diseases
